# Patient Record
Sex: Male | Born: 1940 | Race: Black or African American | Hispanic: No | Smoking: Current every day smoker
Health system: Southern US, Community
[De-identification: ages and names within clinical notes are randomized; demographics above are authoritative.]

## PROBLEM LIST (undated history)

## (undated) DIAGNOSIS — I2510 Atherosclerotic heart disease of native coronary artery without angina pectoris: Secondary | ICD-10-CM

## (undated) DIAGNOSIS — E785 Hyperlipidemia, unspecified: Secondary | ICD-10-CM

## (undated) DIAGNOSIS — J449 Chronic obstructive pulmonary disease, unspecified: Secondary | ICD-10-CM

## (undated) DIAGNOSIS — Z720 Tobacco use: Secondary | ICD-10-CM

## (undated) DIAGNOSIS — I10 Essential (primary) hypertension: Secondary | ICD-10-CM

## (undated) DIAGNOSIS — R05 Cough: Secondary | ICD-10-CM

## (undated) DIAGNOSIS — J45909 Unspecified asthma, uncomplicated: Secondary | ICD-10-CM

## (undated) DIAGNOSIS — R0600 Dyspnea, unspecified: Secondary | ICD-10-CM

## (undated) DIAGNOSIS — R059 Cough, unspecified: Secondary | ICD-10-CM

## (undated) DIAGNOSIS — I739 Peripheral vascular disease, unspecified: Secondary | ICD-10-CM

## (undated) DIAGNOSIS — I119 Hypertensive heart disease without heart failure: Secondary | ICD-10-CM

## (undated) DIAGNOSIS — I219 Acute myocardial infarction, unspecified: Secondary | ICD-10-CM

## (undated) HISTORY — PX: SHOULDER SURGERY: SHX246

## (undated) HISTORY — DX: Hypertensive heart disease without heart failure: I11.9

## (undated) HISTORY — DX: Hyperlipidemia, unspecified: E78.5

## (undated) HISTORY — PX: CARDIAC CATHETERIZATION: SHX172

## (undated) HISTORY — DX: Acute myocardial infarction, unspecified: I21.9

## (undated) HISTORY — PX: CORONARY ANGIOPLASTY: SHX604

## (undated) HISTORY — DX: Atherosclerotic heart disease of native coronary artery without angina pectoris: I25.10

---

## 2000-05-12 ENCOUNTER — Inpatient Hospital Stay (HOSPITAL_COMMUNITY): Admission: AD | Admit: 2000-05-12 | Discharge: 2000-05-16 | Payer: Self-pay | Admitting: Internal Medicine

## 2000-05-16 ENCOUNTER — Encounter: Payer: Self-pay | Admitting: Cardiology

## 2004-02-17 ENCOUNTER — Ambulatory Visit (HOSPITAL_COMMUNITY): Admission: RE | Admit: 2004-02-17 | Discharge: 2004-02-17 | Payer: Self-pay | Admitting: Orthopedic Surgery

## 2004-02-17 ENCOUNTER — Observation Stay (HOSPITAL_COMMUNITY): Admission: RE | Admit: 2004-02-17 | Discharge: 2004-02-18 | Payer: Self-pay | Admitting: Orthopedic Surgery

## 2004-02-17 ENCOUNTER — Ambulatory Visit (HOSPITAL_BASED_OUTPATIENT_CLINIC_OR_DEPARTMENT_OTHER): Admission: RE | Admit: 2004-02-17 | Discharge: 2004-02-17 | Payer: Self-pay | Admitting: Orthopedic Surgery

## 2004-02-20 ENCOUNTER — Ambulatory Visit: Payer: Self-pay | Admitting: Family Medicine

## 2004-05-19 ENCOUNTER — Ambulatory Visit: Payer: Self-pay | Admitting: Family Medicine

## 2004-05-20 ENCOUNTER — Ambulatory Visit: Payer: Self-pay | Admitting: Family Medicine

## 2004-08-12 ENCOUNTER — Ambulatory Visit: Payer: Self-pay | Admitting: Family Medicine

## 2004-10-07 ENCOUNTER — Ambulatory Visit: Payer: Self-pay

## 2012-12-10 ENCOUNTER — Emergency Department: Payer: Self-pay | Admitting: Emergency Medicine

## 2012-12-11 LAB — CBC WITH DIFFERENTIAL/PLATELET
Basophil #: 0.1 10*3/uL (ref 0.0–0.1)
Basophil %: 0.9 %
Eosinophil #: 0.1 10*3/uL (ref 0.0–0.7)
Eosinophil %: 0.9 %
HCT: 45 % (ref 40.0–52.0)
HGB: 14.6 g/dL (ref 13.0–18.0)
Lymphocyte #: 3 10*3/uL (ref 1.0–3.6)
Lymphocyte %: 33.9 %
MCH: 28.8 pg (ref 26.0–34.0)
MCHC: 32.4 g/dL (ref 32.0–36.0)
MCV: 89 fL (ref 80–100)
Monocyte #: 0.9 x10 3/mm (ref 0.2–1.0)
Monocyte %: 10.4 %
Neutrophil #: 4.7 10*3/uL (ref 1.4–6.5)
Neutrophil %: 53.9 %
Platelet: 186 10*3/uL (ref 150–440)
RBC: 5.07 10*6/uL (ref 4.40–5.90)
RDW: 16 % — ABNORMAL HIGH (ref 11.5–14.5)
WBC: 8.7 10*3/uL (ref 3.8–10.6)

## 2012-12-11 LAB — BASIC METABOLIC PANEL
Anion Gap: 2 — ABNORMAL LOW (ref 7–16)
BUN: 15 mg/dL (ref 7–18)
Calcium, Total: 8.9 mg/dL (ref 8.5–10.1)
Chloride: 104 mmol/L (ref 98–107)
Co2: 32 mmol/L (ref 21–32)
Creatinine: 0.95 mg/dL (ref 0.60–1.30)
EGFR (African American): >60
EGFR (Non-African Amer.): >60
Glucose: 103 mg/dL — ABNORMAL HIGH (ref 65–99)
Osmolality: 277 (ref 275–301)
Potassium: 3.7 mmol/L (ref 3.5–5.1)
Sodium: 138 mmol/L (ref 136–145)

## 2012-12-11 LAB — URINALYSIS, COMPLETE
Bacteria: NONE SEEN
Bilirubin,UR: NEGATIVE
Blood: NEGATIVE
Glucose,UR: NEGATIVE mg/dL (ref 0–75)
Ketone: NEGATIVE
Leukocyte Esterase: NEGATIVE
Nitrite: NEGATIVE
Ph: 7 (ref 4.5–8.0)
Protein: NEGATIVE
RBC,UR: NONE SEEN /HPF (ref 0–5)
Specific Gravity: 1.006 (ref 1.003–1.030)
Squamous Epithelial: <1
WBC UR: 1 /HPF (ref 0–5)

## 2012-12-11 LAB — TROPONIN I
Troponin-I: 0.04 ng/mL
Troponin-I: 0.05 ng/mL

## 2012-12-11 LAB — PRO B NATRIURETIC PEPTIDE: B-Type Natriuretic Peptide: 367 pg/mL — ABNORMAL HIGH (ref 0–125)

## 2012-12-13 ENCOUNTER — Inpatient Hospital Stay: Payer: Self-pay | Admitting: Internal Medicine

## 2012-12-13 LAB — COMPREHENSIVE METABOLIC PANEL
Alkaline Phosphatase: 47 U/L
Anion Gap: 6 — ABNORMAL LOW (ref 7–16)
Bilirubin,Total: 0.2 mg/dL (ref 0.2–1.0)
Calcium, Total: 9.1 mg/dL (ref 8.5–10.1)
Chloride: 103 mmol/L (ref 98–107)
Co2: 28 mmol/L (ref 21–32)
EGFR (African American): >60
Glucose: 80 mg/dL (ref 65–99)
Osmolality: 273 (ref 275–301)
Potassium: 4 mmol/L (ref 3.5–5.1)
SGOT(AST): 19 U/L (ref 15–37)
SGPT (ALT): 19 U/L (ref 12–78)
Sodium: 137 mmol/L (ref 136–145)
Total Protein: 7.7 g/dL (ref 6.4–8.2)

## 2012-12-13 LAB — CBC
HGB: 14.8 g/dL (ref 13.0–18.0)
MCHC: 32.3 g/dL (ref 32.0–36.0)
MCV: 88 fL (ref 80–100)
Platelet: 175 10*3/uL (ref 150–440)
RBC: 5.2 10*6/uL (ref 4.40–5.90)
WBC: 7.6 10*3/uL (ref 3.8–10.6)

## 2012-12-13 LAB — CK TOTAL AND CKMB (NOT AT ARMC)
CK, Total: 76 U/L (ref 35–232)
CK-MB: 1.9 ng/mL (ref 0.5–3.6)

## 2012-12-13 LAB — APTT: Activated PTT: 33.7 secs (ref 23.6–35.9)

## 2012-12-13 LAB — PROTIME-INR
INR: 1
Prothrombin Time: 13.6 secs (ref 11.5–14.7)

## 2012-12-13 LAB — TROPONIN I
Troponin-I: 0.02 ng/mL
Troponin-I: 0.03 ng/mL

## 2012-12-14 LAB — CBC WITH DIFFERENTIAL/PLATELET
Basophil #: 0.1 10*3/uL (ref 0.0–0.1)
Basophil %: 1.2 %
Eosinophil #: 0.1 10*3/uL (ref 0.0–0.7)
HGB: 14.2 g/dL (ref 13.0–18.0)
Lymphocyte #: 2.5 10*3/uL (ref 1.0–3.6)
MCH: 28.8 pg (ref 26.0–34.0)
Monocyte #: 0.9 x10 3/mm (ref 0.2–1.0)
Monocyte %: 10.2 %
Platelet: 178 10*3/uL (ref 150–440)
RBC: 4.94 10*6/uL (ref 4.40–5.90)
RDW: 15.4 % — ABNORMAL HIGH (ref 11.5–14.5)
WBC: 8.6 10*3/uL (ref 3.8–10.6)

## 2012-12-14 LAB — BASIC METABOLIC PANEL
Anion Gap: 9 (ref 7–16)
Chloride: 101 mmol/L (ref 98–107)
Creatinine: 0.92 mg/dL (ref 0.60–1.30)
EGFR (African American): >60
EGFR (Non-African Amer.): >60
Glucose: 129 mg/dL — ABNORMAL HIGH (ref 65–99)
Osmolality: 278 (ref 275–301)
Potassium: 3.6 mmol/L (ref 3.5–5.1)

## 2012-12-14 LAB — TROPONIN I
Troponin-I: 0.03 ng/mL
Troponin-I: 0.03 ng/mL

## 2012-12-14 LAB — LIPID PANEL
Ldl Cholesterol, Calc: 86 mg/dL (ref 0–100)
Triglycerides: 140 mg/dL (ref 0–200)

## 2012-12-15 ENCOUNTER — Emergency Department: Payer: Self-pay | Admitting: Emergency Medicine

## 2014-05-02 NOTE — Consult Note (Signed)
PATIENT NAME:  Jacob Ford, Jacob Ford DATE OF BIRTH:  Jul 19, 1940  DATE OF CONSULTATION:  12/14/2012  CONSULTING PHYSICIAN:  Laurier NancyShaukat A. Gilda Abboud, MD  INDICATION FOR CONSULTATION: Uncontrolled hypertension.  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 The patient is feeling much better now. The patient is normally followed by Dr. Maryellen PileEason. His blood pressure in the Emergency Room was 215/134. It came down to 170 to 180 systolic gradually.   PAST MEDICAL HISTORY: Just history of hypertension.   ALLERGIES: None.   SOCIAL HISTORY: He smokes 1 pack per day. Is a retired Corporate investment bankerconstruction worker. He used to be heavy alcohol user but not right now. No history of IV drug abuse.   FAMILY HISTORY: Positive for hypertension.   MEDICATIONS: He normally takes metoprolol 25 mg b.i.d.   PHYSICAL EXAMINATION: GENERAL: He is alert, oriented x 3, in no acute distress.  VITAL SIGNS: Right now his blood pressure is 148/80, respirations 18, pulse 74, temperature 97.9.  NECK: No JVD.  LUNGS: Clear.  HEART: Regular rate and rhythm. Normal S1, S2. No audible murmur.  ABDOMEN: Soft, nontender, positive bowel sounds.  EXTREMITIES: No pedal edema.  NEUROLOGIC: The patient appears to be intact.   EKG shows normal sinus rhythm, LVH, nonspecific ST-T changes.   ASSESSMENT AND PLAN: Uncontrolled hypertension. The patient is normally only on metoprolol. I added hydrochlorothiazide 25 mg once a day. Blood pressure appears to be much better. May be able to go home. He is feeling much better. We will follow up in the office at 3:00 on Tuesday.   Thank you very much for the referral.  ____________________________ Laurier NancyShaukat A. Quetzaly Ebner, MD sak:jcm D: 12/14/2012 13:37:31 ET T: 12/14/2012 14:07:39 ET JOB#: [id number]  cc: Laurier NancyShaukat A. Maleea Camilo, MD, <Dictator>  Laurier NancySHAUKAT A Alyx Mcguirk  MD ELECTRONICALLY SIGNED 12/17/2012 8:33

## 2014-05-02 NOTE — Discharge Summary (Signed)
PATIENT NAME:  Jacob Ford, Jacob Ford MR#:  [id number] DATE OF BIRTH:  05-Dec-1940  DATE OF ADMISSION:  12/13/2012 DATE OF DISCHARGE:  12/14/2012  FINAL DIAGNOSES:  1. Malignant hypertension.  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  CONDITION ON DISCHARGE: Stable.   CODE STATUS: Full code.   MEDICATIONS ON DISCHARGE:  1. Metoprolol 25 mg b.i.d.  2. Hydrochlorothiazide 25 mg once a day.   DIET ON DISCHARGE: Low sodium. Diet consistency: Regular.  FOLLOWUP: Advised to have followup in 3 to 4 days in Dr. Christy SartoriusShaukat Khan's office.  HISTORY OF PRESENT ILLNESS: As per Dr. Margaretmary EddyShah's report on 4th of December. A 74 year old male with unknown medical problem, admitted for uncontrolled hypertension. The patient was in the Emergency Room for uncontrolled blood pressure 2 days ago, and metoprolol was given and discharged home, but he started feeling lightheaded and decided to come back to the Emergency Room. His blood pressure was noticed at 215/134 in the ER and so admitted for further management of hypertensive urgency.   HOSPITAL COURSE AND STAY: For his malignant hypertension, he was started on nicardipine and nitroglycerin IV drip, and blood pressure came under control within a few hours. After that, he was switched to oral and stopped IV drip. Blood pressure remained stable on metoprolol and hydrochlorothiazide. As he was hypertensive for unknown period of time, there might be a component of hypertensive cardiomyopathy. There were some EKG changes suggestive of that, so cardiology consult was called in. Dr. Adrian BlackwaterShaukat Khan saw him, and he suggested to discharge the patient home, as blood pressure came under control, and he will do echocardiogram in the office, and so we discharged him home.   Other medical issues: Smoking. He was a smoker. Smoking cessation counseling was done.  IMPORTANT LABORATORY RESULTS IN THE HOSPITAL: Troponin 0.02. INR was 1. WBC 7.6, hemoglobin 14.8, platelet count 175. Creatinine 0.77,  potassium 4.0. Creatinine and hemoglobin were stable the next day.  TOTAL TIME SPENT ON THIS DISCHARGE: 40 minutes.   ____________________________ Hope PigeonVaibhavkumar G. Elisabeth PigeonVachhani, MD vgv:lb D: 12/18/2012 09:11:31 ET T: 12/18/2012 09:38:56 ET JOB#: [id number]  cc: Hope PigeonVaibhavkumar G. Elisabeth PigeonVachhani, MD, <Dictator> Laurier NancyShaukat A. Khan, MD Altamese DillingVAIBHAVKUMAR Nassim Cosma MD ELECTRONICALLY SIGNED 12/31/2012 14:20

## 2014-05-02 NOTE — H&P (Signed)
PATIENT NAME:  Jacob Ford, Jacob Ford MR#:  [id number] DATE OF BIRTH:  08-May-1940  DATE OF ADMISSION:  12/13/2012  CONSULTING PHYSICIAN:  Dr. Sherryll Burger, hospitalist.  PRIMARY CARE PHYSICIAN:  None.  REQUESTING PHYSICIAN:  Dr. Sharman Cheek.  CHIEF COMPLAINT:  Lightheaded and dizziness. [name]   HISTORY OF PRESENT ILLNESS:  The patient is a 74 year old male with a known medical problem who is being admitted for uncontrolled hypertension. The patient was seen here in the Emergency Department on last Monday for uncontrolled blood pressure and above-mentioned symptoms and was prescribed metoprolol and was discharged home with outpatient followup with Dr. Maryellen Pile. The patient continued to get more lightheaded and decided to come back to the Emergency Department as his blood pressure at home on monitor was running anywhere from 170 to 180s. While in the ED, his blood pressure was 215/134 and he is being admitted for further evaluation and management.  PAST MEDICAL HISTORY:  Hypertension.   ALLERGIES:  No known drug allergies.   SOCIAL HISTORY:  Smokes 1 pack of cigarettes daily for 60 years. 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 He used to be a heavy alcohol drinker, not much since the last four years. Denies any IV drugs of abuse.   FAMILY HISTORY:  Father and mother both had hypertension. Father died of brain aneurysm in mid 47s. Mother died of stroke.   MEDICATIONS AT HOME:  Usually he does not take any but recently from his ED visit, he was prescribed. Metoprolol 25 mg 1 tablet twice a day, which he has started taking.   REVIEW OF SYSTEMS:  CONSTITUTIONAL:  No fever. Positive fatigue and weakness.  EYES:  No blurred or double vision. He wears glasses.  ENT:  No tinnitus or ear pain.  RESPIRATORY:  No cough, wheezing, hemoptysis.  CARDIOVASCULAR:  No chest pain, orthopnea, edema.  GASTROINTESTINAL:  No nausea, vomiting or diarrhea.  GENITOURINARY:  No dysuria, hematuria.   ENDOCRINE:  No polyuria or nocturia.  HEMATOLOGY:  No anemia or easy bruising.  SKIN:  No rash or lesion.  MUSCULOSKELETAL:  No arthritis or muscle cramp.  NEUROLOGIC:  No tingling or numbness. Positive for weakness and dizziness.  PSYCHIATRIC:  No history of anxiety or depression.   PHYSICAL EXAMINATION:  VITAL SIGNS:  Temperature 97.8, heart rate 79 per minute, respirations 20 per minute, blood pressure 215/134 mmHg, saturating 96% room air.  GENERAL:  The patient is a 74 year old African American male lying in the bed comfortably without any acute distress.  EYES:  Pupils equal, round and reactive to accommodation. No scleral icterus. Extraocular muscles intact.  HEENT:  Head atraumatic, normocephalic. Oropharynx and nasopharynx clear. NECK:  Supple. No jugular venous distention. No thyroid enlargement or tenderness.  LUNGS:  Clear to auscultation bilaterally. No wheezing, rales, rhonchi or crepitation.  CARDIOVASCULAR:  S1, S2 normal. No murmurs, rubs, or gallop.  ABDOMEN:  Soft, nontender, nondistended. Bowel sounds present. No organomegaly.  EXTREMITIES:  No pedal edema, cyanosis, or clubbing.  NEUROLOGIC:  Nonfocal examination. Cranial nerves II through XII intact. Muscle strength 5/5 in all extremities. Sensation intact.  PSYCHIATRIC:  Alert and oriented x 3.  SKIN:  No obvious rash, lesion or ulcer.   LABORATORY, DIAGNOSTIC, AND RADIOLOGICAL DATA:   Negative urinalysis on 2nd of December. Normal coagulation panel. Normal CBC. Normal first set of cardiac enzymes. Normal liver function tests. Normal BMP.   Chest x-ray in the Emergency Department today shows no acute cardiopulmonary disease.   EKG  shows normal sinus rhythm, possible left atrial enlargement. No major ST-T changes.   IMPRESSION AND PLAN:  1.  Malignant hypertension with blood pressure of 215/134 and has been continued to stay high. He was given 20 mg of IV labetalol in the Emergency Department but pressure has still  been staying in 210/130 and he is being admitted to Critical Care Unit Stepdown for close monitoring. We will start him on IV nicardipine drip. We will also start hydrochlorothiazide and metoprolol and consult cardiology for medication help. We will get 2-D echo for any evaluating any organ damage. He had a urinalysis recently, so we will hold off repeating urinalysis. He does have a strong history of brain aneurysm and this may need to be looked into it. Certainly, we need to get his blood pressure under good control as both of his parents died due to likely hypertensive complication with stroke and aneurysm rupture. He also has symptoms of lightheaded and dizziness which is likely due to malignant hypertension.  2.  Tobacco abuse. He has a long-standing smoking history. He was counseled for about three minutes. He is trying to cut back, but not there yet. In any need of nicotine replacement therapy while in the hospital.   CODE STATUS:  FULL CODE.   TOTAL TIME TAKING CARE OF THIS PATIENT (CRITICAL CARE):  55 minutes. He remains at very high risk for hypertensive complications. He will need close monitoring in the Critical Care Unit on a Cardizem drip.    ____________________________ Ellamae SiaVipul S. Sherryll BurgerShah, MD vss:jm D: 12/13/2012 16:44:59 ET T: 12/13/2012 17:28:34 ET JOB#: [id number]  cc: Elizar Alpern S. Sherryll BurgerShah, MD, <Dictator> Serita ShellerErnest B. Maryellen PileEason, MD Patricia PesaVIPUL S Corlis Angelica MD ELECTRONICALLY SIGNED 12/14/2012 18:14

## 2014-05-02 NOTE — Consult Note (Signed)
General Aspect Jacob Ford is a 74yo African American male w/ minimal known PMHx including HTN and ongoing tobacco abuse who was admitted to [facility] yesterday for malignant hypertension.   He was recently evaluated at [facility] ED for elevated BP. Labwork including CBC, BMP, TnI, u/a returned WNL. Noncontrast head CT showed no acute intracranial abnormalities. CXR showed mild cardiac enlargement, otherwise no acute cardiopulmonary process. He was discharged on metoprolol tartrate 68m PO BID. He returned yesterday c/o lightheadedness and headache. BP at home noted to be in the 170-180 (SBP) range. He does continue to smoke. Given his ongoing symptoms[facility]  He denies any prior cardiac history. He denies chest pain, worsening SOB (dyspneic at baseline from COPD), LE edema, PND or orthopnea. No hematuria. He continues to smoke 1 PPD. He is scheduled to establish primary care with Dr. EFarrel Conners 1.   Present Illness There, BP was found to be returned very mildly elevated at 215/134. EKG showed LAE, boderline LAE, early repolarization and inferolateral ST/T changes c/w LV strain. BNP was very mildly elevated at 367. CBC and TnI returned WNL. CMP showed a mild hypoalbuminemia at 3.3, but was otherwise WNL. Portable CXR showed no active disease. He was given a dose of IV labetalol which had minimal effect. He was admitted by the medicine service and started on a nicardipine drip. BP today has been mostly in the 150/80-90 range. Two subsequent troponins returned WNL.   PAST MEDICAL HISTORY:  Hypertension.   ALLERGIES:  No known drug allergies.   SOCIAL HISTORY:  Smokes 1 pack of cigarettes daily for 60 years. 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 He used to be a heavy alcohol drinker, not much since the last four years. Denies any IV drugs of abuse.   FAMILY HISTORY:  Father and mother both had hypertension. Father died of brain aneurysm in mid 451s Mother died of  stroke.   Physical Exam:  GEN no acute distress, thin   HEENT pink conjunctivae, PERRL, hearing intact to voice   NECK supple  No masses  trachea midline  no JVD or bruits   RESP normal resp effort  no use of accessory muscles  scattered rhonchi which clears with cough, no wheezes or rales   CARD Regular rate and rhythm  Normal, S1, S2  No murmur   ABD denies tenderness  soft  normal BS   EXTR negative cyanosis/clubbing, negative edema   SKIN normal to palpation, skin turgor good   NEURO follows commands, motor/sensory function intact   PSYCH alert, A+O to time, place, person   Review of Systems:  Subjective/Chief Complaint headache, lightheadedness   General: no fevers, chills, n/v/d, abnormal bleeding, + nonproductive cough   Neurologic: no isolated weakness, numbness, facial droop or slurred speech   Review of Systems: All other systems were reviewed and found to be negative   Home Medications: Medication Instructions Status  Metoprolol Tartrate 25 mg oral tablet 1 tab(s) orally 2 times a day Active   Lab Results:  Routine Chem:  05-Dec-14 03:46   Cholesterol, Serum 161  Triglycerides, Serum 140  HDL (INHOUSE) 47  VLDL Cholesterol Calculated 28  LDL Cholesterol Calculated 86 (Result(s) reported on 14 Dec 2012 at 09:49AM.)  Glucose, Serum  129  BUN 14  Creatinine (comp) 0.92  Sodium, Serum 138  Potassium, Serum 3.6  Chloride, Serum 101  CO2, Serum 28  Calcium (Total), Serum 9.0  Anion Gap 9  Osmolality (calc) 278  eGFR (African American) >60  eGFR (Non-African American) >60 (eGFR values <55m/min/1.73 m2 may be an indication of chronic kidney disease (CKD). Calculated eGFR is useful in patients with stable renal function. The eGFR calculation will not be reliable in acutely ill patients when serum creatinine is changing rapidly. It is not useful in  patients on dialysis. The eGFR calculation may not be applicable to patients at the low and high  extremes of body sizes, pregnant women, and vegetarians.)  Cardiac:  04-Dec-14 15:02   Troponin I 0.02 (0.00-0.05 0.05 ng/mL or less: NEGATIVE  Repeat testing in 3-6 hrs  if clinically indicated. >0.05 ng/mL: POTENTIAL  MYOCARDIAL INJURY. Repeat  testing in 3-6 hrs if  clinically indicated. NOTE: An increase or decrease  of 30% or more on serial  testing suggests a  clinically important change)  CK, Total 76  CPK-MB, Serum 1.9 (Result(s) reported on 13 Dec 2012 at 03:38PM.)    19:13   Troponin I 0.03 (0.00-0.05 0.05 ng/mL or less: NEGATIVE  Repeat testing in 3-6 hrs  if clinically indicated. >0.05 ng/mL: POTENTIAL  MYOCARDIAL INJURY. Repeat  testing in 3-6 hrs if  clinically indicated. NOTE: An increase or decrease  of 30% or more on serial  testing suggests a  clinically important change)    23:40   Troponin I 0.03 (0.00-0.05 0.05 ng/mL or less: NEGATIVE  Repeat testing in 3-6 hrs  if clinically indicated. >0.05 ng/mL: POTENTIAL  MYOCARDIAL INJURY. Repeat  testing in 3-6 hrs if  clinically indicated. NOTE: An increase or decrease  of 30% or more on serial  testing suggests a  clinically important change)  05-Dec-14 03:46   Troponin I 0.03 (0.00-0.05 0.05 ng/mL or less: NEGATIVE  Repeat testing in 3-6 hrs  if clinically indicated. >0.05 ng/mL: POTENTIAL  MYOCARDIAL INJURY. Repeat  testing in 3-6 hrs if  clinically indicated. NOTE: An increase or decrease  of 30% or more on serial  testing suggests a  clinically important change)  Routine Hem:  05-Dec-14 03:46   WBC (CBC) 8.6  RBC (CBC) 4.94  Hemoglobin (CBC) 14.2  Hematocrit (CBC) 43.5  Platelet Count (CBC) 178  MCV 88  MCH 28.8  MCHC 32.7  RDW  15.4  Neutrophil % 57.7  Lymphocyte % 29.7  Monocyte % 10.2  Eosinophil % 1.2  Basophil % 1.2  Neutrophil # 4.9  Lymphocyte # 2.5  Monocyte # 0.9  Eosinophil # 0.1  Basophil # 0.1 (Result(s) reported on 14 Dec 2012 at 04:44AM.)   EKG:   Interpretation NSR, LAE, borderline LVH, downsloping ST depressions V5, V6 with asymmetric TWIs in V5, V6, II, III, aVF, early repolarization appreciated in V1-V3   Rate 80   EKG Comparision Not changed from  12/4 tracing at 1424   Radiology Results: XRay:    04-Dec-14 14:45, Chest Portable Single View  Chest Portable Single View   REASON FOR EXAM:    Chest Pain  COMMENTS:       PROCEDURE: DXR - DXR PORTABLE CHEST SINGLE VIEW  - Dec 13 2012  2:45PM     CLINICAL DATA:  Chest pain    EXAM:  PORTABLE CHEST - 1 VIEW    COMPARISON:  None.    FINDINGS:  The heart size and mediastinalcontours are within normal limits.  Both lungs are clear. The visualized skeletal structures are  unremarkable.     IMPRESSION:  No active disease.      Electronically Signed  By: Kerby Moors M.D.    On: 12/13/2012 14:56         Verified By: Angelita Ingles, M.D.,    No Known Allergies:   Vital Signs/Nurse's Notes: **Vital Signs.:   05-Dec-14 09:00  Vital Signs Type Routine  Pulse Pulse 86  Respirations Respirations 18  Systolic BP Systolic BP 518  Diastolic BP (mmHg) Diastolic BP (mmHg) 90  Mean BP 111  Pulse Ox % Pulse Ox % 95  Pulse Ox Activity Level  At rest  Oxygen Delivery Room Air/ 21 %    Impression 74yo African American male w/ minimal known PMHx including HTN and ongoing tobacco abuse who was admitted to [facility] yesterday for malignant hypertension.  1. Malignant hypertension BP trended down with IV nicardipine. Off infusion currenlty. SBP trending back up to 170s. Did receive a dose of HCTZ this AM. Suspect a component of familial HTN and underlying tobacco abuse contributing. He has stopped smoking since Monday, and withdrawal could account for the large BP spike. Metoprolol tartrate not the best choice for this demographic. CCBs, diuretics and vasodilators more effective in the African American population, hence the good response to IV nicardipine. EKG does show evidence  of hypertensive heart disease- LAE, borderline LVH, LV strain and early repolarization. CXR shows cardiomegaly. No CHF type symptoms. No hematuria, neurological deficits, chest pain. Goal per new guideliens for patients > 60yo without CKD or DM2 is < 150/90. -- Will replace metoprolol with amlodipine 70m daily -- Continue HCTZ -- Await echo to be performed -- Avoid salt -- Follow-up in 1-2 weeks in our clinic with BMP  2. 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 May be at stage B on the heart failure spectrum. Will need to control BP.  -- Start low dose ASA -- Interpret 2D echo -- Could consider low-dose ACEi/BB if EF reduced. Ischemic eval will also need to be considered if this is the case.   3. Ongoing tobacco abuse with probable COPD Withdrawal contributing to elevated BP. Rhoncherous on exam.  -- Cessation stressed  -- Offer NRT in the form of nicotine patch taper or nicotine gum -- Would start Spiriva + albuterol PRN, and then PCP follow-up   Electronic Signatures: Lillyian Heidt A (PA-C)   (Signed 05-Dec-14 11:32)  Authored: History and Physical Exam, Review of System, Home Medications, Labs, Radiology, Allergies, Vital Signs/Nurse's Notes, General Aspect/Present Illness, Impression/Plan, EKG AKathlyn Sacramento(MD)   (Signed 05-Dec-14 16:37)  Co-Signer: History and Physical Exam, Review of System, Home Medications, Labs, Radiology, Allergies, Vital Signs/Nurse's Notes, General Aspect/Present Illness, Impression/Plan, EKG  Last Updated: 05-Dec-14 16:37 by AKathlyn Sacramento(MD)

## 2014-12-05 ENCOUNTER — Emergency Department: Payer: Medicare Other

## 2014-12-05 ENCOUNTER — Emergency Department
Admission: EM | Admit: 2014-12-05 | Discharge: 2014-12-05 | Disposition: A | Discharge status: Home / Self Care | Payer: Medicare Other | Source: Home / Self Care | Attending: Emergency Medicine | Admitting: Emergency Medicine

## 2014-12-05 ENCOUNTER — Encounter: Payer: Self-pay | Admitting: Emergency Medicine

## 2014-12-05 DIAGNOSIS — R072 Precordial pain: Secondary | ICD-10-CM | POA: Diagnosis not present

## 2014-12-05 DIAGNOSIS — I214 Non-ST elevation (NSTEMI) myocardial infarction: Secondary | ICD-10-CM | POA: Diagnosis not present

## 2014-12-05 DIAGNOSIS — R079 Chest pain, unspecified: Secondary | ICD-10-CM

## 2014-12-05 HISTORY — DX: Essential (primary) hypertension: I10

## 2014-12-05 HISTORY — DX: Chronic obstructive pulmonary disease, unspecified: J44.9

## 2014-12-05 LAB — BASIC METABOLIC PANEL
Anion gap: 6 (ref 5–15)
BUN: 11 mg/dL (ref 6–20)
CHLORIDE: 102 mmol/L (ref 101–111)
CO2: 31 mmol/L (ref 22–32)
Calcium: 9 mg/dL (ref 8.9–10.3)
Creatinine, Ser: 0.98 mg/dL (ref 0.61–1.24)
GFR calc Af Amer: >60 mL/min (ref >60)
GFR calc non Af Amer: >60 mL/min (ref >60)
Glucose, Bld: 85 mg/dL (ref 65–99)
Potassium: 3.5 mmol/L (ref 3.5–5.1)
Sodium: 139 mmol/L (ref 135–145)

## 2014-12-05 LAB — TROPONIN I
TROPONIN I: 0.04 ng/mL — AB (ref <0.031)
TROPONIN I: 0.05 ng/mL — AB (ref <0.031)

## 2014-12-05 LAB — CBC
HCT: 47.5 % (ref 40.0–52.0)
HEMOGLOBIN: 15.2 g/dL (ref 13.0–18.0)
MCH: 27.8 pg (ref 26.0–34.0)
MCHC: 31.9 g/dL — ABNORMAL LOW (ref 32.0–36.0)
MCV: 87.2 fL (ref 80.0–100.0)
Platelets: 198 10*3/uL (ref 150–440)
RBC: 5.45 MIL/uL (ref 4.40–5.90)
RDW: 15.8 % — ABNORMAL HIGH (ref 11.5–14.5)
WBC: 7.3 10*3/uL (ref 3.8–10.6)

## 2014-12-05 MED ORDER — ASPIRIN 81 MG PO CHEW
324.0000 mg | CHEWABLE_TABLET | Freq: Once | ORAL | Status: AC
  Administered 2014-12-05: 324 mg via ORAL
  Filled 2014-12-05: qty 4

## 2014-12-05 MED ORDER — LABETALOL HCL 5 MG/ML IV SOLN
20.0000 mg | Freq: Once | INTRAVENOUS | Status: DC
  Filled 2014-12-05: qty 4

## 2014-12-05 MED ORDER — IOHEXOL 350 MG/ML SOLN
100.0000 mL | Freq: Once | INTRAVENOUS | Status: AC | PRN
  Administered 2014-12-05: 100 mL via INTRAVENOUS

## 2014-12-05 NOTE — Discharge Instructions (Signed)
Cardiac-Specific Troponin I and T Test WHY AM I HAVING THIS TEST? You may have this test if you have experienced chest pain. The test can be used to determine if you have had a heart attack or injury to heart (cardiac) muscle. This test can also help predict the possibility of future heart attacks. This test measures the concentration of cardiac-specific troponin in your blood. Troponins are proteins that help muscles contract. There are three forms of troponin, including troponins C, I, and T. The types of troponins I and T that are found in cardiac muscle are different from the troponins I and T that are found in skeletal muscle. Therefore, testing can be done for cardiac-specific troponins I and T. These types of troponin are normally present in very small quantities in the blood. When there is damage to heart muscle cells, cardiac troponins I and T are released into circulation. The more damage there is, the greater the concentration of troponins I and T. When a person has a heart attack, levels of troponin can become elevated in the blood within 3-4 hours after injury and may remain elevated for 10-14 days. WHAT KIND OF SAMPLE IS TAKEN? A blood sample is required for this test. It is usually collected by inserting a needle into a vein. Usually, an initial blood sample is collected, and then another blood sample is collected 12 hours later. After these samples, you will have your blood tested daily for 3-5 days. You might also have it tested weekly for 5-6 weeks. HOW DO I PREPARE FOR THE TEST? There is no preparation required for this test. However, be aware that you will need to make arrangements to have your blood collected frequently.  WHAT ARE THE REFERENCE RANGES? Reference values are considered healthy values established after testing a large group of healthy people. Reference values may vary among different people, labs, and hospitals. It is your responsibility to obtain your test results. Ask  the lab or department performing the test when and how you will get your results. Reference values for cardiac troponins are as follows:  Cardiac troponin T: less than 0.1 ng/mL.  Cardiac troponin I: less than 0.03 ng/mL. WHAT DO THE RESULTS MEAN? Troponin values above the reference values may indicate:  Injury to the heart muscle.  Heart attack. Talk with your health care provider to discuss your results, treatment options, and if necessary, the need for more tests. Talk with your health care provider if you have any questions about your results.   This information is not intended to replace advice given to you by your health care provider. Make sure you discuss any questions you have with your health care provider.   Document Released: 01/30/2004 Document Revised: 01/17/2014 Document Reviewed: 05/22/2013 Elsevier Interactive Patient Education 2016 [employer]  Nonspecific Chest Pain  Chest pain can be caused by many different conditions. There is always a chance that your pain could be related to something serious, such as a heart attack or a blood clot in your lungs. Chest pain can also be caused by conditions that are not life-threatening. If you have chest pain, it is very important to follow up with your health care provider. CAUSES  Chest pain can be caused by:  Heartburn.  Pneumonia or bronchitis.  Anxiety or stress.  Inflammation around your heart (pericarditis) or lung (pleuritis or pleurisy).  A blood clot in your lung.  A collapsed lung (pneumothorax). It can develop suddenly on its own (spontaneous pneumothorax) or from trauma  to the chest.  Shingles infection (varicella-zoster virus).  Heart attack.  Damage to the bones, muscles, and cartilage that make up your chest wall. This can include:  Bruised bones due to injury.  Strained muscles or cartilage due to frequent or repeated coughing or overwork.  Fracture to one or more ribs.  Sore cartilage due to  inflammation (costochondritis). RISK FACTORS  Risk factors for chest pain may include:  Activities that increase your risk for trauma or injury to your chest.  Respiratory infections or conditions that cause frequent coughing.  Medical conditions or overeating that can cause heartburn.  Heart disease or family history of heart disease.  Conditions or health behaviors that increase your risk of developing a blood clot.  Having had chicken pox (varicella zoster). SIGNS AND SYMPTOMS Chest pain can feel like:  Burning or tingling on the surface of your chest or deep in your chest.  Crushing, pressure, aching, or squeezing pain.  Dull or sharp pain that is worse when you move, cough, or take a deep breath.  Pain that is also felt in your back, neck, shoulder, or arm, or pain that spreads to any of these areas. Your chest pain may come and go, or it may stay constant. DIAGNOSIS Lab tests or other studies may be needed to find the cause of your pain. Your health care provider may have you take a test called an ambulatory ECG (electrocardiogram). An ECG records your heartbeat patterns at the time the test is performed. You may also have other tests, such as:  Transthoracic echocardiogram (TTE). During echocardiography, sound waves are used to create a picture of all of the heart structures and to look at how blood flows through your heart.  Transesophageal echocardiogram (TEE).This is a more advanced imaging test that obtains images from inside your body. It allows your health care provider to see your heart in finer detail.  Cardiac monitoring. This allows your health care provider to monitor your heart rate and rhythm in real time.  Holter monitor. This is a portable device that records your heartbeat and can help to diagnose abnormal heartbeats. It allows your health care provider to track your heart activity for several days, if needed.  Stress tests. These can be done through  exercise or by taking medicine that makes your heart beat more quickly.  Blood tests.  Imaging tests. TREATMENT  Your treatment depends on what is causing your chest pain. Treatment may include:  Medicines. These may include:  Acid blockers for heartburn.  Anti-inflammatory medicine.  Pain medicine for inflammatory conditions.  Antibiotic medicine, if an infection is present.  Medicines to dissolve blood clots.  Medicines to treat coronary artery disease.  Supportive care for conditions that do not require medicines. This may include:  Resting.  Applying heat or cold packs to injured areas.  Limiting activities until pain decreases. HOME CARE INSTRUCTIONS  If you were prescribed an antibiotic medicine, finish it all even if you start to feel better.  Avoid any activities that bring on chest pain.  Do not use any tobacco products, including cigarettes, chewing tobacco, or electronic cigarettes. If you need help quitting, ask your health care provider.  Do not drink alcohol.  Take medicines only as directed by your health care provider.  Keep all follow-up visits as directed by your health care provider. This is important. This includes any further testing if your chest pain does not go away.  If heartburn is the cause for your chest pain,  you may be told to keep your head raised (elevated) while sleeping. This reduces the chance that acid will go from your stomach into your esophagus.  Make lifestyle changes as directed by your health care provider. These may include:  Getting regular exercise. Ask your health care provider to suggest some activities that are safe for you.  Eating a heart-healthy diet. A registered dietitian can help you to learn healthy eating options.  Maintaining a healthy weight.  Managing diabetes, if necessary.  Reducing stress. SEEK MEDICAL CARE IF:  Your chest pain does not go away after treatment.  You have a rash with blisters on  your chest.  You have a fever. SEEK IMMEDIATE MEDICAL CARE IF:   Your chest pain is worse.  You have an increasing cough, or you cough up blood.  You have severe abdominal pain.  You have severe weakness.  You faint.  You have chills.  You have sudden, unexplained chest discomfort.  You have sudden, unexplained discomfort in your arms, back, neck, or jaw.  You have shortness of breath at any time.  You suddenly start to sweat, or your skin gets clammy.  You feel nauseous or you vomit.  You suddenly feel light-headed or dizzy.  Your heart begins to beat quickly, or it feels like it is skipping beats. These symptoms may represent a serious problem that is an emergency. Do not wait to see if the symptoms will go away. Get medical help right away. Call your local emergency services (911 in the [place] Do not drive yourself to the hospital.   This information is not intended to replace advice given to you by your health care provider. Make sure you discuss any questions you have with your health care provider.   Document Released: 10/06/2004 Document Revised: 01/17/2014 Document Reviewed: 08/02/2013 Elsevier Interactive Patient Education [employer]  Please return immediately if condition worsens. Please contact her primary physician or the physician you were given for referral. If you have any specialist physicians involved in her treatment and plan please also contact them. Thank you for using [facility].  Please take an aspirin a day and notify your primary physician and will follow-up with the recommended cardiologist. Return to emergency department if he was to continue your evaluation for chest pain.

## 2014-12-05 NOTE — ED Notes (Signed)
MD aware of HTN at discharge

## 2014-12-05 NOTE — ED Notes (Signed)
EKG taken at 1400, viewed by Dr Deberah CastlePaduchoski.

## 2014-12-05 NOTE — ED Notes (Signed)
MD Quigley at bedside. 

## 2014-12-05 NOTE — ED Notes (Signed)
RN entered room to administer labetalol, pt disclosed to RN that he just took his own 50 mg metoprolol at this time. MD Huel CoteQuigley made aware, verbalized to hold labetalol at this time in case still needed later. Pt made aware and verbalized understanding, pt also made aware to ask RN first prior to taking any home meds while here. Pt verbalized understanding, no further needs at this time.

## 2014-12-05 NOTE — ED Notes (Signed)
Pt reports right sided chest pain that started last night, reports taking tylenol and getting relief. Pt reports shortness of breath (hx of COPD), nausea.

## 2014-12-05 NOTE — ED Notes (Signed)
Pt given sandwich tray 

## 2014-12-05 NOTE — ED Provider Notes (Signed)
Time Seen: Approximately *----------------------------------------- 4:33 PM on 12/05/2014 -----------------------------------------    I have reviewed the triage notes  Chief Complaint: Chest Pain   History of Present Illness: Jacob Ford is a 74 y.o. male *who presents with chest pain that started last night. Patient states he took 2 Tylenol and had relief of his discomfort. He points mainly to the right upper chest region. He denies any other discomfort to the left side of his chest arm or jaw region. He denies any nausea or vomiting he states he has COPD and has a chronic cough and hasn't noticed any increased cough or fever at home. He denies any productive nature to his cough. He denies any peripheral edema, calf tenderness or swelling. Patient denies any focal weakness in either upper or lower extremities. He states he was pain-free for through most of the night and then ate breakfast this morning or this afternoon at 1 PM and started having this right-sided chest discomfort once again he did not take any medication prior to arrival.   Past Medical History  Diagnosis Date  . COPD (chronic obstructive pulmonary disease) (HCC)   . Hypertension     There are no active problems to display for this patient.   Past Surgical History  Procedure Laterality Date  . Shoulder surgery Left     Past Surgical History  Procedure Laterality Date  . Shoulder surgery Left     No current outpatient prescriptions on file.  Allergies:  Review of patient's allergies indicates no known allergies.  Family History: No family history on file.  Social History: Social History  Substance Use Topics  . Smoking status: Current Every Day Smoker    Types: Cigarettes  . Smokeless tobacco: None  . Alcohol Use: No     Review of Systems:   10 point review of systems was performed and was otherwise negative:  Constitutional: No fever Eyes: No visual disturbances ENT: No sore throat,  ear pain Cardiac: No chest pain Respiratory: No shortness of breath, wheezing, or stridor Abdomen: No abdominal pain, no vomiting, No diarrhea Endocrine: No weight loss, No night sweats Extremities: No peripheral edema, cyanosis Skin: No rashes, easy bruising Neurologic: No focal weakness, trouble with speech or swollowing Urologic: No dysuria, Hematuria, or urinary frequency   Physical Exam:  ED Triage Vitals  Enc Vitals Group     BP 12/05/14 1402 178/100 mmHg     Pulse Rate 12/05/14 1402 77     Resp 12/05/14 1402 22     Temp 12/05/14 1402 97.9 F (36.6 C)     Temp Source 12/05/14 1402 Oral     SpO2 12/05/14 1402 96 %     Weight 12/05/14 1402 238 lb (107.956 kg)     Height 12/05/14 1402  (1.905 m)     Head Cir --      Peak Flow --      Pain Score 12/05/14 1419 3     Pain Loc --      Pain Edu? --      Excl. in GC? --     General: Awake , Alert , and Oriented times 3; GCS 15 Head: Normal cephalic , atraumatic Eyes: Pupils equal , round, reactive to light Nose/Throat: No nasal drainage, patent upper airway without erythema or exudate.  Neck: Supple, Full range of motion, No anterior adenopathy or palpable thyroid masses Lungs: Clear to ascultation without wheezes , rhonchi, or rales Heart: Regular rate, regular rhythm without murmurs ,  gallops , or rubs Abdomen: Soft, non tender without rebound, guarding , or rigidity; bowel sounds positive and symmetric in all 4 quadrants. No organomegaly .        Extremities: 2 plus symmetric pulses. No edema, clubbing or cyanosis Neurologic: normal ambulation, Motor symmetric without deficits, sensory intact Skin: warm, dry, no rashes   Labs:   All laboratory work was reviewed including any pertinent negatives or positives listed below:  Labs Reviewed  TROPONIN I - Abnormal; Notable for the following:    Troponin I 0.04 (*)    All other components within normal limits  CBC - Abnormal; Notable for the following:    MCHC 31.9  (*)    RDW 15.8 (*)    All other components within normal limits  BASIC METABOLIC PANEL   patient received serial troponins first one was 0.0 for repeat was 0.05  EKG:  ED ECG REPORT I, Leara Rawl S Iyana Topor, the attendiJennye Moccasinng physician, personally viewed and interpreted this ECG.  Date: 12/05/2014 EKG Time: *1400 Rate: 76 Rhythm: normal sinus rhythm QRS Axis: normal Intervals: normal ST/T Wave abnormalities: Nonspecific ST-T wave abnormality Narrative Interpretation: unremarkable    Radiology:  EXAM: CT ANGIOGRAPHY CHEST WITH CONTRAST  TECHNIQUE: Multidetector CT imaging of the chest was performed using the standard protocol during bolus administration of intravenous contrast. Multiplanar CT image reconstructions and MIPs were obtained to evaluate the vascular anatomy.  CONTRAST: 100mL OMNIPAQUE IOHEXOL 350 MG/ML SOLN  COMPARISON: Chest x-ray today and 12/13/2012  FINDINGS: Lungs are well inflated and demonstrate no focal consolidation or effusion. There is linear scarring/ atelectasis over the right lower lobe. 3 mm nodule over the right upper lobe. 3-4 mm nodule over the left upper lobe. Subtle bronchial wall thickening over the lower lobes.  There is mild cardiomegaly. Sub cm right pericardial phrenic lymph node is present. No evidence of pulmonary embolism. Mild calcified plaque over the left lateral circumflex coronary artery. Mild calcified plaque over the thoracic aorta. No significant hilar or mediastinal adenopathy. Mildly prominent left axillary lymph node measuring 1.5 cm by short axis.  Possible minimal bilateral gynecomastia right worse than left.  Images through the upper abdomen demonstrate a sub cm hypodensity over the left lobe of the liver likely a cyst. There is also a 1.7 cm hypodensity partially visualized over the right lobe of the liver likely a cyst. 1.8 cm hypodensity over the upper pole right kidney with Hounsfield unit measurements 22. 2.5  cm right adrenal mass with Hounsfield unit measurements 26 likely an adenoma. There mild degenerate changes of the spine.  Review of the MIP images confirms the above findings.  IMPRESSION: Minimal bronchial wall thickening over the lower lobes which may be due to an acute bronchitic process. Linear atelectasis/ scarring right lower lobe.  Couple tiny pulmonary nodules as described with the larger over the left upper lobe measuring 3-4 mm. Recommend follow-up noncontrast chest CT 1 year. This recommendation follows the consensus statement: Guidelines for Management of Small Pulmonary Nodules Detected on CT Scans: A Statement from the Fleischner Society as published in Radiology 2005; 237:395-400. Online at: [email or web address].[employer].edu/rad/res/Fleischner-nodule.htm.  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  Couple liver hypodensities likely cysts.  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  1.8 cm hypodensity over the upper pole right renal cortex likely a cyst, but indeterminate. Recommend followup CT 6 months pre and post-contrast.   Electronically Signed By: Elberta Fortisaniel Boyle M.D.   I personally reviewed the radiologic  studies     ED Course: Differential includes all life-threatening causes for chest pain. This includes but is not exclusive to acute coronary syndrome, aortic dissection, pulmonary embolism, cardiac tamponade, community-acquired pneumonia, pericarditis, musculoskeletal chest wall pain, etc. The patient's chest discomfort was somewhat atypical with her being right side with really no associated symptoms. Patient does have multiple cardiovascular risk factors including smoking hypertension, family history, etc. Patient was advised to stay in the hospital for further inpatient testing such as serial blood work and possibly objective studies such as a stress echocardiogram, etc. Declines at this point after  speaking to the hospitalist team. He wishes to be discharged and follow up with his primary physician. I also referred him to cardiology unassigned for further outpatient management. He's been advised to return here to the emergency department especially if he wishes to continue his evaluation, pain changes in its characteristics were location, or any new concerns. Patient was cautioned with the risk of going home with his family member present. He is advised if he does decide to be leaving (which he did) see that he should take an aspirin a day in the meantime.   Assessment:  Acute unspecified chest pain  Final Clinical Impression:  Final diagnoses:  Chest pain     Plan:  Outpatient management at the patient's request  Patient was advised to return immediately if condition worsens. Patient was advised to follow up with her primary care physician or other specialized physicians involved and in their current assessment.            Jennye Moccasin, MD 12/05/14 [phone]

## 2014-12-07 ENCOUNTER — Inpatient Hospital Stay
Admission: EM | Admit: 2014-12-07 | Discharge: 2014-12-09 | DRG: 247 | Disposition: A | Discharge status: Home / Self Care | Payer: Medicare Other | Attending: Internal Medicine | Admitting: Internal Medicine

## 2014-12-07 ENCOUNTER — Encounter: Payer: Self-pay | Admitting: Emergency Medicine

## 2014-12-07 ENCOUNTER — Inpatient Hospital Stay: Payer: Medicare Other

## 2014-12-07 DIAGNOSIS — F17200 Nicotine dependence, unspecified, uncomplicated: Secondary | ICD-10-CM | POA: Diagnosis present

## 2014-12-07 DIAGNOSIS — Z823 Family history of stroke: Secondary | ICD-10-CM

## 2014-12-07 DIAGNOSIS — E669 Obesity, unspecified: Secondary | ICD-10-CM | POA: Diagnosis present

## 2014-12-07 DIAGNOSIS — Z6829 Body mass index (BMI) 29.0-29.9, adult: Secondary | ICD-10-CM | POA: Diagnosis not present

## 2014-12-07 DIAGNOSIS — I25111 Atherosclerotic heart disease of native coronary artery with angina pectoris with documented spasm: Secondary | ICD-10-CM | POA: Diagnosis not present

## 2014-12-07 DIAGNOSIS — I2510 Atherosclerotic heart disease of native coronary artery without angina pectoris: Secondary | ICD-10-CM | POA: Diagnosis not present

## 2014-12-07 DIAGNOSIS — I209 Angina pectoris, unspecified: Secondary | ICD-10-CM | POA: Diagnosis not present

## 2014-12-07 DIAGNOSIS — I214 Non-ST elevation (NSTEMI) myocardial infarction: Principal | ICD-10-CM | POA: Diagnosis present

## 2014-12-07 DIAGNOSIS — E785 Hyperlipidemia, unspecified: Secondary | ICD-10-CM | POA: Diagnosis present

## 2014-12-07 DIAGNOSIS — K7689 Other specified diseases of liver: Secondary | ICD-10-CM | POA: Diagnosis present

## 2014-12-07 DIAGNOSIS — J449 Chronic obstructive pulmonary disease, unspecified: Secondary | ICD-10-CM | POA: Diagnosis present

## 2014-12-07 DIAGNOSIS — R072 Precordial pain: Secondary | ICD-10-CM | POA: Diagnosis present

## 2014-12-07 DIAGNOSIS — I10 Essential (primary) hypertension: Secondary | ICD-10-CM | POA: Diagnosis present

## 2014-12-07 DIAGNOSIS — R001 Bradycardia, unspecified: Secondary | ICD-10-CM | POA: Diagnosis present

## 2014-12-07 DIAGNOSIS — Z720 Tobacco use: Secondary | ICD-10-CM | POA: Diagnosis not present

## 2014-12-07 DIAGNOSIS — Z8249 Family history of ischemic heart disease and other diseases of the circulatory system: Secondary | ICD-10-CM

## 2014-12-07 HISTORY — DX: Tobacco use: Z72.0

## 2014-12-07 LAB — BASIC METABOLIC PANEL
ANION GAP: 8 (ref 5–15)
BUN: 9 mg/dL (ref 6–20)
CALCIUM: 9.3 mg/dL (ref 8.9–10.3)
CO2: 32 mmol/L (ref 22–32)
CREATININE: 0.9 mg/dL (ref 0.61–1.24)
Chloride: 97 mmol/L — ABNORMAL LOW (ref 101–111)
GFR calc Af Amer: >60 mL/min (ref >60)
GFR calc non Af Amer: >60 mL/min (ref >60)
GLUCOSE: 156 mg/dL — AB (ref 65–99)
POTASSIUM: 3.5 mmol/L (ref 3.5–5.1)
SODIUM: 137 mmol/L (ref 135–145)

## 2014-12-07 LAB — DIFFERENTIAL
Basophils Absolute: 0 10*3/uL (ref 0–0.1)
EOS ABS: 0.1 10*3/uL (ref 0–0.7)
Eosinophils Relative: 1 %
LYMPHS ABS: 1.6 10*3/uL (ref 1.0–3.6)
Lymphocytes Relative: 16 %
MONO ABS: 0.7 10*3/uL (ref 0.2–1.0)
NEUTROS ABS: 7.7 10*3/uL — AB (ref 1.4–6.5)
Neutrophils Relative %: 76 %

## 2014-12-07 LAB — PROTIME-INR
INR: 1.07
Prothrombin Time: 14.1 seconds (ref 11.4–15.0)

## 2014-12-07 LAB — CBC
HEMATOCRIT: 49.2 % (ref 40.0–52.0)
HEMOGLOBIN: 15.8 g/dL (ref 13.0–18.0)
MCH: 27.5 pg (ref 26.0–34.0)
MCHC: 31.1 g/dL — AB (ref 32.0–36.0)
MCV: 88.2 fL (ref 80.0–100.0)
Platelets: 200 10*3/uL (ref 150–440)
RBC: 5.55 MIL/uL (ref 4.40–5.90)
RDW: 15.6 % — AB (ref 11.5–14.5)
WBC: 10.1 10*3/uL (ref 3.8–10.6)

## 2014-12-07 LAB — TROPONIN I
TROPONIN I: 2.37 ng/mL — AB (ref <0.031)
Troponin I: 2.05 ng/mL — ABNORMAL HIGH (ref <0.031)

## 2014-12-07 LAB — APTT: APTT: 33 s (ref 24–36)

## 2014-12-07 MED ORDER — ACETAMINOPHEN 650 MG RE SUPP: 650.0000 mg | Freq: Four times a day (QID) | RECTAL | Status: DC | PRN

## 2014-12-07 MED ORDER — ONDANSETRON HCL 4 MG/2ML IJ SOLN: 4.0000 mg | Freq: Four times a day (QID) | INTRAMUSCULAR | Status: DC | PRN

## 2014-12-07 MED ORDER — ATORVASTATIN CALCIUM 20 MG PO TABS: 20.0000 mg | ORAL_TABLET | Freq: Every day | ORAL | Status: DC

## 2014-12-07 MED ORDER — HYDROCODONE-ACETAMINOPHEN 5-325 MG PO TABS
1.0000 | ORAL_TABLET | ORAL | Status: DC | PRN
  Administered 2014-12-09: 1 via ORAL
  Filled 2014-12-07: qty 1

## 2014-12-07 MED ORDER — ALUM & MAG HYDROXIDE-SIMETH 200-200-20 MG/5ML PO SUSP
30.0000 mL | Freq: Four times a day (QID) | ORAL | Status: DC | PRN
Start: 2014-12-07 — End: 2014-12-09

## 2014-12-07 MED ORDER — FLUTICASONE FUROATE-VILANTEROL 100-25 MCG/INH IN AEPB: 1.0000 | INHALATION_SPRAY | Freq: Every day | RESPIRATORY_TRACT | Status: DC

## 2014-12-07 MED ORDER — SENNOSIDES-DOCUSATE SODIUM 8.6-50 MG PO TABS: 1.0000 | ORAL_TABLET | Freq: Every evening | ORAL | Status: DC | PRN

## 2014-12-07 MED ORDER — NITROGLYCERIN 2 % TD OINT
1.0000 [in_us] | TOPICAL_OINTMENT | Freq: Four times a day (QID) | TRANSDERMAL | Status: DC
  Administered 2014-12-07 – 2014-12-08 (×3): 1 [in_us] via TOPICAL
  Filled 2014-12-07 (×2): qty 1

## 2014-12-07 MED ORDER — ACETAMINOPHEN 500 MG PO TABS: 1000.0000 mg | ORAL_TABLET | Freq: Four times a day (QID) | ORAL | Status: DC | PRN

## 2014-12-07 MED ORDER — ASPIRIN EC 81 MG PO TBEC
81.0000 mg | DELAYED_RELEASE_TABLET | Freq: Every day | ORAL | Status: DC
  Administered 2014-12-07 – 2014-12-09 (×2): 81 mg via ORAL
  Filled 2014-12-07 (×2): qty 1

## 2014-12-07 MED ORDER — SODIUM CHLORIDE 0.9 % IV SOLN
INTRAVENOUS | Status: DC
  Administered 2014-12-07: 21:00:00 via INTRAVENOUS

## 2014-12-07 MED ORDER — HYDRALAZINE HCL 20 MG/ML IJ SOLN
10.0000 mg | Freq: Four times a day (QID) | INTRAMUSCULAR | Status: DC | PRN
  Filled 2014-12-07: qty 1

## 2014-12-07 MED ORDER — LISINOPRIL 20 MG PO TABS
20.0000 mg | ORAL_TABLET | Freq: Every day | ORAL | Status: DC
  Administered 2014-12-07 – 2014-12-09 (×3): 20 mg via ORAL
  Filled 2014-12-07 (×3): qty 1

## 2014-12-07 MED ORDER — HYDROCHLOROTHIAZIDE 25 MG PO TABS
25.0000 mg | ORAL_TABLET | Freq: Every day | ORAL | Status: DC
  Administered 2014-12-07 – 2014-12-09 (×3): 25 mg via ORAL
  Filled 2014-12-07 (×3): qty 1

## 2014-12-07 MED ORDER — MOMETASONE FURO-FORMOTEROL FUM 100-5 MCG/ACT IN AERO
2.0000 | INHALATION_SPRAY | Freq: Two times a day (BID) | RESPIRATORY_TRACT | Status: DC
  Administered 2014-12-08 – 2014-12-09 (×3): 2 via RESPIRATORY_TRACT
  Filled 2014-12-07: qty 8.8

## 2014-12-07 MED ORDER — SODIUM CHLORIDE 0.9 % IJ SOLN: 3.0000 mL | Freq: Two times a day (BID) | INTRAMUSCULAR | Status: DC

## 2014-12-07 MED ORDER — LISINOPRIL-HYDROCHLOROTHIAZIDE 20-25 MG PO TABS: 1.0000 | ORAL_TABLET | Freq: Every day | ORAL | Status: DC

## 2014-12-07 MED ORDER — ACETAMINOPHEN 325 MG PO TABS
650.0000 mg | ORAL_TABLET | Freq: Four times a day (QID) | ORAL | Status: DC | PRN
  Administered 2014-12-07: 650 mg via ORAL
  Filled 2014-12-07: qty 2

## 2014-12-07 MED ORDER — SODIUM CHLORIDE 0.9 % IJ SOLN: 3.0000 mL | INTRAMUSCULAR | Status: DC | PRN

## 2014-12-07 MED ORDER — METOPROLOL TARTRATE 50 MG PO TABS
50.0000 mg | ORAL_TABLET | Freq: Two times a day (BID) | ORAL | Status: DC
  Administered 2014-12-07 – 2014-12-09 (×4): 50 mg via ORAL
  Filled 2014-12-07 (×4): qty 1

## 2014-12-07 MED ORDER — NICOTINE 21 MG/24HR TD PT24
21.0000 mg | MEDICATED_PATCH | Freq: Every day | TRANSDERMAL | Status: DC
  Administered 2014-12-07 – 2014-12-09 (×3): 21 mg via TRANSDERMAL
  Filled 2014-12-07 (×3): qty 1

## 2014-12-07 MED ORDER — HEPARIN BOLUS VIA INFUSION
4000.0000 [IU] | Freq: Once | INTRAVENOUS | Status: AC
  Administered 2014-12-07: 4000 [IU] via INTRAVENOUS
  Filled 2014-12-07: qty 4000

## 2014-12-07 MED ORDER — NITROGLYCERIN 0.4 MG SL SUBL
0.4000 mg | SUBLINGUAL_TABLET | SUBLINGUAL | Status: DC | PRN
  Administered 2014-12-07 – 2014-12-09 (×6): 0.4 mg via SUBLINGUAL
  Filled 2014-12-07 (×3): qty 1

## 2014-12-07 MED ORDER — ASPIRIN 81 MG PO CHEW
324.0000 mg | CHEWABLE_TABLET | Freq: Once | ORAL | Status: AC
  Administered 2014-12-07: 324 mg via ORAL

## 2014-12-07 MED ORDER — SODIUM CHLORIDE 0.9 % WEIGHT BASED INFUSION
1.0000 mL/kg/h | INTRAVENOUS | Status: DC
  Administered 2014-12-08: 1 mL/kg/h via INTRAVENOUS

## 2014-12-07 MED ORDER — HEPARIN (PORCINE) IN NACL 100-0.45 UNIT/ML-% IJ SOLN
1300.0000 [IU]/h | INTRAMUSCULAR | Status: DC
  Administered 2014-12-07 (×2): 1300 [IU]/h via INTRAVENOUS
  Filled 2014-12-07 (×2): qty 250

## 2014-12-07 MED ORDER — ONDANSETRON HCL 4 MG PO TABS: 4.0000 mg | ORAL_TABLET | Freq: Four times a day (QID) | ORAL | Status: DC | PRN

## 2014-12-07 MED ORDER — MORPHINE SULFATE (PF) 2 MG/ML IV SOLN: 1.0000 mg | INTRAVENOUS | Status: DC | PRN

## 2014-12-07 MED ORDER — SODIUM CHLORIDE 0.9 % IV SOLN: 250.0000 mL | INTRAVENOUS | Status: DC | PRN

## 2014-12-07 MED ORDER — SODIUM CHLORIDE 0.9 % WEIGHT BASED INFUSION
3.0000 mL/kg/h | INTRAVENOUS | Status: DC
  Administered 2014-12-08: 3 mL/kg/h via INTRAVENOUS

## 2014-12-07 MED ORDER — ASPIRIN 81 MG PO CHEW
81.0000 mg | CHEWABLE_TABLET | ORAL | Status: AC
  Administered 2014-12-08: 81 mg via ORAL
  Filled 2014-12-07: qty 1

## 2014-12-07 NOTE — Progress Notes (Signed)
Consent signed for cahterization

## 2014-12-07 NOTE — Progress Notes (Signed)
Heparin drip started at 1013ml/hr with bolus of 4000, verifying RN, CK.

## 2014-12-07 NOTE — Progress Notes (Signed)
ANTICOAGULATION CONSULT NOTE - Initial Consult  Pharmacy Consult for heparin Indication: chest pain/ACS  No Known Allergies  Patient Measurements: Height: 6' 2.5" (189.2 cm) Weight: 220 lb 3.2 oz (99.882 kg) IBW/kg (Calculated) : 83.35 Heparin Dosing Weight: 99.9 kg  Vital Signs: Temp: 97.8 F (36.6 C) (11/27 2017) Temp Source: Oral (11/27 2017) BP: 164/87 mmHg (11/27 2017) Pulse Rate: 70 (11/27 2017)  Labs:  Recent Labs  12/05/14 1504 12/05/14 1834 12/07/14 1807 12/07/14 2031  HGB 15.2  --  15.8  --   HCT 47.5  --  49.2  --   PLT 198  --  200  --   APTT  --   --  33  --   LABPROT  --   --  14.1  --   INR  --   --  1.07  --   CREATININE 0.98  --  0.90  --   TROPONINI 0.04* 0.05* 2.05* 2.37*    Estimated Creatinine Clearance: 84.9 mL/min (by C-G formula based on Cr of 0.9).   Medical History: Past Medical History  Diagnosis Date  . COPD (chronic obstructive pulmonary disease) (HCC)   . Hypertension     Medications:  Infusions:  . sodium chloride 75 mL/hr at 12/07/14 2116  . [START ON 12/08/2014] sodium chloride     Followed by  . [START ON 12/08/2014] sodium chloride    . heparin 1,300 Units/hr (12/07/14 2108)    Assessment: 74 yom cc chest pain, substernal with radiating to right arm. Troponin > 2 x 1 will serial. Plan for cath tomorrow. Starting UFH.   Goal of Therapy:  Heparin level 0.3-0.7 units/ml Monitor platelets by anticoagulation protocol: Yes   Plan:  Give 4000 units bolus x 1 Start heparin infusion at 1300 units/hr Check anti-Xa level in 8 hours and daily while on heparin Continue to monitor H&H and platelets  Carola FrostNathan A Shadd Dunstan, Pharm.D., BCPS Clinical Pharmacist 12/07/2014,11:22 PM

## 2014-12-07 NOTE — ED Provider Notes (Signed)
[facility] Emergency Department Provider Note   ____________________________________________  Time seen: Upon ED arrival I have reviewed the triage vital signs and the triage nursing note.  HISTORY  Chief Complaint Chest Pain   Historian Patient and son  HPI Jacob Ford is a 74 y.o. male with a history of hypertension COPD and recent chest pain, is here for evaluation of chest pain which is been ongoing all day. Patient was seen in the emergency department on Friday with complaint of chest pain and was found to have a troponin elevation from 0.04-0.05 and was recommended to stay in the hospital, the patient chose to go home. Patient states he did have some chest pain yesterday for which he took Tylenol and that helped. Today he has had central and left-sided chest pain all day long. He's had some nausea without vomiting. No shortness of breath or trouble breathing. No fever. No coughing.    Past Medical History  Diagnosis Date  . COPD (chronic obstructive pulmonary disease) (HCC)   . Hypertension     Patient Active Problem List   Diagnosis Date Noted  . NSTEMI (non-ST elevated myocardial infarction) (HCC) 12/07/2014    Past Surgical History  Procedure Laterality Date  . Shoulder surgery Left   . Cardiac catheterization      Current Outpatient Rx  Name  Route  Sig  Dispense  Refill  . acetaminophen (TYLENOL) 500 MG tablet   Oral   Take 1,000 mg by mouth every 6 (six) hours as needed for mild pain.         [name] Fluticasone Furoate-Vilanterol (BREO ELLIPTA) 100-25 MCG/INH AEPB   Inhalation   Inhale 1 puff into the lungs daily.          [name] lisinopril-hydrochlorothiazide (PRINZIDE,ZESTORETIC) 20-25 MG tablet   Oral   Take 1 tablet by mouth daily.         . meloxicam (MOBIC) 15 MG tablet   Oral   Take 15 mg by mouth daily.         . metoprolol (LOPRESSOR) 50 MG tablet   Oral   Take 50 mg by mouth 2 (two) times daily.            Allergies Review of patient's allergies indicates no known allergies.  No family history on file.  Social History Social History  Substance Use Topics  . Smoking status: Current Every Day Smoker    Types: Cigarettes  . Smokeless tobacco: None  . Alcohol Use: No    Review of Systems  Constitutional: Negative for fever. Eyes: Negative for visual changes. ENT: Negative for sore throat. Cardiovascular: Positive for chest pain. Respiratory: Negative for shortness of breath. Gastrointestinal: Negative for abdominal pain, vomiting and diarrhea. Genitourinary: Negative for dysuria. Musculoskeletal: Negative for back pain. Skin: Negative for rash. Neurological: Negative for headache. 10 point Review of Systems otherwise negative ____________________________________________   PHYSICAL EXAM:  VITAL SIGNS: ED Triage Vitals  Enc Vitals Group     BP 12/07/14 1806 190/175 mmHg     Pulse Rate 12/07/14 1806 85     Resp 12/07/14 1806 24     Temp 12/07/14 1806 98 F (36.7 C)     Temp Source 12/07/14 1806 Oral     SpO2 12/07/14 1806 95 %     Weight 12/07/14 1806 230 lb (104.327 kg)     Height 12/07/14 1806 6' 2.5" (1.892 m)     Head Cir --      Peak  Flow --      Pain Score 12/07/14 1808 6     Pain Loc --      Pain Edu? --      Excl. in GC? --      Constitutional: Alert and oriented. Well appearing and in no distress. Eyes: Conjunctivae are normal. PERRL. Normal extraocular movements. ENT   Head: Normocephalic and atraumatic.   Nose: No congestion/rhinnorhea.   Mouth/Throat: Mucous membranes are moist.   Neck: No stridor. Cardiovascular/Chest: Normal rate, regular rhythm.  No murmurs, rubs, or gallops. Respiratory: Normal respiratory effort without tachypnea nor retractions. Breath sounds are clear and equal bilaterally. No wheezes/rales/rhonchi. Gastrointestinal: Soft. No distention, no guarding, no rebound. Nontender    Genitourinary/rectal:Deferred Musculoskeletal: Nontender with normal range of motion in all extremities. No joint effusions.  No lower extremity tenderness.  No edema. Neurologic:  Normal speech and language. No gross or focal neurologic deficits are appreciated. Skin:  Skin is warm, dry and intact. No rash noted. Psychiatric: Mood and affect are normal. Speech and behavior are normal. Patient exhibits appropriate insight and judgment.  ____________________________________________   EKG I, Governor Rooks, MD, the attending physician have personally viewed and interpreted all ECGs.  81 bpm. Normal sinus rhythm. Narrow QRS. Normal axis. Nonspecific ST segment with ST depression laterally. ____________________________________________  LABS (pertinent positives/negatives)  CBC and metabolic panel without significant abnormalities and troponin 2.05  ____________________________________________  RADIOLOGY All Xrays were viewed by me. Imaging interpreted by Radiologist.  Chest x-ray portable:  No acute abnormalities or significant interval change. __________________________________________  PROCEDURES  Procedure(s) performed: None  Critical Care performed: CRITICAL CARE Performed by: Governor Rooks   Total critical care time: 45 minutes  Critical care time was exclusive of separately billable procedures and treating other patients.  Critical care was necessary to treat or prevent imminent or life-threatening deterioration.  Critical care was time spent personally by me on the following activities: development of treatment plan with patient and/or surrogate as well as nursing, discussions with consultants, evaluation of patient's response to treatment, examination of patient, obtaining history from patient or surrogate, ordering and performing treatments and interventions, ordering and review of laboratory studies, ordering and review of radiographic studies, pulse oximetry and  re-evaluation of patient's condition.   ____________________________________________   ED COURSE / ASSESSMENT AND PLAN  CONSULTATIONS: Hospitalist for admission  Pertinent labs & imaging results that were available during my care of the patient were reviewed by me and considered in my medical decision making (see chart for details).  Patient arrived significantly hypertensive with significant chest discomfort concerning for acute coronary syndrome. His EKG showed ST segment depression laterally. No criteria for STEMI.  Patient was given aspirin 324 upon arrival. He was also given sublingual nitroglycerin which did alleviate his pain. Reviewed his history indicates he is here on Friday with chest pain and was initially recommended to stay, however patient wanted to go home and follow-up with Dr. Gwen Pounds on Monday which is scheduled. However today he had more significant pain all day and chose to come in for reevaluation.  Troponin came back elevated to 2.05. I discussed with the patient and he is placed on heparin bolus and drip for acute Kari syndrome. Patient's chest pain did work turn and he was given a second subungual nitroglycerin which did again alleviate his pain to 0. Nitroglycerin paste was placed.  I discussed the case with the hospitalist doctor Mody for admission.  Patient / Family / Caregiver informed of clinical course, medical  decision-making process, and agree with plan.    ___________________________________________   FINAL CLINICAL IMPRESSION(S) / ED DIAGNOSES   Final diagnoses:  NSTEMI (non-ST elevated myocardial infarction) (HCC)  Precordial pain       Governor Rooksebecca Ravinder Lukehart, MD 12/07/14 2025

## 2014-12-07 NOTE — ED Notes (Signed)
Patient presents to the ED for chest pain and shortness of breath.  Patient reports feeling chest pain starting two days ago.  Patient reports pain mostly on the right side of his chest and pain is feeling very sore.  Patient is alert and oriented at this time.

## 2014-12-07 NOTE — ED Notes (Signed)
X-ray at bedside at this time.

## 2014-12-07 NOTE — Progress Notes (Signed)
Pt. Arrived to unit via stretcher. Pt. Walked from stretcher to bed without staff assistance. Tele applied. General room orientation given. How to use call bell and ascom system. Fall risk sign signed.  Pt. Is A&O. No signs or c/o pain. No acute distress noted. Sons are at bedside. Bed alarm on.    Skin assessment performed, no skin issues noted. [facility] by [facility],RN

## 2014-12-07 NOTE — H&P (Signed)
[facility] - [place] at [facility]   PATIENT NAME: Jacob Ford    MR#:  [id number]  DATE OF BIRTH:  1940-10-23  DATE OF ADMISSION:  12/07/2014  PRIMARY CARE PHYSICIAN: Derwood Kaplan, MD   REQUESTING/REFERRING PHYSICIAN: Dr Shaune Pollack  CHIEF COMPLAINT:  Chest pain   HISTORY OF PRESENT ILLNESS:  Jacob Ford  is a 74 y.o. male with a known history of HTN and tobacco abuse who presents with substernal chest pain radiating to his right arm. This is a remission as of breath and dyspnea physician. Patient actually presented on Friday with chest pain. He had 2 troponins that were 0.04 and 0.05. Patient was adamant about leaving and therefore went home. His chest pain over the weekend has increased in duration and frequency and therefore his son brought him here for further evaluation. In emergency room, his troponin was greater than 2. He is started on heparin drip. After consent. He was also given nitroglycerin and he is chest pain-free. I have spoken with the cardiologist on-call, and patient will have a cardiac catheterization for tomorrow. Patient is agreeable for this.  PAST MEDICAL HISTORY:   Past Medical History  Diagnosis Date  . COPD (chronic obstructive pulmonary disease) (HCC)   . Hypertension     PAST SURGICAL HISTORY:   Past Surgical History  Procedure Laterality Date  . Shoulder surgery Left   . Cardiac catheterization      SOCIAL HISTORY:   Social History  Substance Use Topics  . Smoking status: Current Every Day Smoker    Types: Cigarettes  . Smokeless tobacco: Not on file  . Alcohol Use: No    FAMILY HISTORY:  Positive hypertension  DRUG ALLERGIES:  No Known Allergies   REVIEW OF SYSTEMS:  CONSTITUTIONAL: No fever, fatigue or weakness.  EYES: No blurred or double vision.  EARS, NOSE, AND THROAT: No tinnitus or ear pain.  RESPIRATORY: No cough, shortness of breath, wheezing or hemoptysis.  CARDIOVASCULAR: ++ chest pain,NO   orthopnea, NOedema. Shortness of breath and dyspnea physician associated with his chest pain only GASTROINTESTINAL: No nausea, vomiting, diarrhea or abdominal pain.  GENITOURINARY: No dysuria, hematuria.  ENDOCRINE: No polyuria, nocturia,  HEMATOLOGY: No anemia, easy bruising or bleeding SKIN: No rash or lesion. MUSCULOSKELETAL: No joint pain or arthritis.   NEUROLOGIC: No tingling, numbness, weakness.  PSYCHIATRY: No anxiety or depression.   MEDICATIONS AT HOME:   Prior to Admission medications   Medication Sig Start Date End Date Taking? Authorizing Provider  acetaminophen (TYLENOL) 500 MG tablet Take 1,000 mg by mouth every 6 (six) hours as needed for mild pain.    Historical Provider, MD  Fluticasone Furoate-Vilanterol (BREO ELLIPTA) 100-25 MCG/INH AEPB Inhale 1 puff into the lungs daily.     Historical Provider, MD  lisinopril-hydrochlorothiazide (PRINZIDE,ZESTORETIC) 20-25 MG tablet Take 1 tablet by mouth daily.    Historical Provider, MD  meloxicam (MOBIC) 15 MG tablet Take 15 mg by mouth daily.    Historical Provider, MD  metoprolol (LOPRESSOR) 50 MG tablet Take 50 mg by mouth 2 (two) times daily.    Historical Provider, MD      VITAL SIGNS:  Blood pressure 164/98, pulse 77, temperature 98 F (36.7 C), temperature source Oral, resp. rate 18, height 6' 2.5" (1.892 m), weight 104.327 kg (230 lb), SpO2 94 %.  PHYSICAL EXAMINATION:  GENERAL:  74 y.o.-year-old patient lying in the bed with no acute distress.  EYES: Pupils equal, round, reactive to light and  accommodation. No scleral icterus. Extraocular muscles intact.  HEENT: Head atraumatic, normocephalic. Oropharynx and nasopharynx clear.  NECK:  Supple, no jugular venous distention. No thyroid enlargement, no tenderness.  LUNGS: Normal breath sounds bilaterally, no wheezing, rales,rhonchi or crepitation. No use of accessory muscles of respiration.  CARDIOVASCULAR: S1, S2 normal. 2/6 SEM NO rubs, or gallops.  ABDOMEN: Soft,  nontender, nondistended. Bowel sounds present. No organomegaly or mass.  EXTREMITIES: No pedal edema, cyanosis, or clubbing.  NEUROLOGIC: Cranial nerves II through XII are grossly intact. No focal deficits. PSYCHIATRIC: The patient is alert and oriented x 3.  SKIN: No obvious rash, lesion, or ulcer.   LABORATORY PANEL:   CBC  Recent Labs Lab 12/07/14 1807  WBC 10.1  HGB 15.8  HCT 49.2  PLT 200   ------------------------------------------------------------------------------------------------------------------  Chemistries   Recent Labs Lab 12/07/14 1807  NA 137  K 3.5  CL 97*  CO2 32  GLUCOSE 156*  BUN 9  CREATININE 0.90  CALCIUM 9.3   ------------------------------------------------------------------------------------------------------------------  Cardiac Enzymes  Recent Labs Lab 12/05/14 1504 12/05/14 1834 12/07/14 1807  TROPONINI 0.04* 0.05* 2.05*   ------------------------------------------------------------------------------------------------------------------  RADIOLOGY:  No results found.  EKG:   Normal sinus rhythm with mild ST depression in the lateral leads.  IMPRESSION AND PLAN:   This is a 74 year old male with a history of essential hypertension and tobacco abuse who presents with chest pain.  1. Non-ST elevation MI: The patient has currently ruled out in for non-ST elevation MI. Patient will need to undergo cardiac catheterization. Patient has been consented for heparin. I have personally reviewed side effects, alternatives, benefits and risk of heparin not limited to bleeding. Patient accepts these risks. Patient will continue on beta blocker. Patient has been started on aspirin and statin. I have spoken with cardiology on-call, and patient will undergo cardiac catheterization in a.m. Patient is nothing by mouth after midnight.  2. Tobacco dependence: Patient does not want to quit smoking at this time. However, he is highly encouraged to do  so. Patient is counseled for 3 minutes.  3. Essential hypertension: Patient will continue his outpatient medications.  4. Obesity: Patient is counseled for diet and exercise.   5. COPD: This appears to be stable at this point. All the records are reviewed and case discussed with ED provider. Management plans discussed with the patient and he is in agreement.  CODE STATUS: FULL  TOTAL TIME TAKING CARE OF THIS PATIENT: 50 minutes.    Sorcha Rotunno M.D on 12/07/2014 at 7:12 PM  Between 7am to 6pm - Pager - [phone] After 6pm go to [email or web address] - password [email or web address] [facility]  [facility]  Office  [phone]  CC: Primary care physician; Derwood KaplanEason,  Ernest B, MD

## 2014-12-07 NOTE — ED Notes (Signed)
MD Lord at bedside. 

## 2014-12-08 ENCOUNTER — Encounter: Payer: Self-pay | Admitting: Physician Assistant

## 2014-12-08 ENCOUNTER — Encounter
Admission: EM | Disposition: A | Discharge status: Home / Self Care | Payer: Self-pay | Source: Home / Self Care | Attending: Internal Medicine

## 2014-12-08 DIAGNOSIS — I214 Non-ST elevation (NSTEMI) myocardial infarction: Principal | ICD-10-CM

## 2014-12-08 DIAGNOSIS — I2510 Atherosclerotic heart disease of native coronary artery without angina pectoris: Secondary | ICD-10-CM

## 2014-12-08 HISTORY — PX: CARDIAC CATHETERIZATION: SHX172

## 2014-12-08 LAB — CBC
HCT: 43.1 % (ref 40.0–52.0)
Hemoglobin: 13.7 g/dL (ref 13.0–18.0)
MCH: 27.5 pg (ref 26.0–34.0)
MCHC: 31.7 g/dL — AB (ref 32.0–36.0)
MCV: 86.8 fL (ref 80.0–100.0)
PLATELETS: 174 10*3/uL (ref 150–440)
RBC: 4.96 MIL/uL (ref 4.40–5.90)
RDW: 15.6 % — AB (ref 11.5–14.5)
WBC: 10.7 10*3/uL — ABNORMAL HIGH (ref 3.8–10.6)

## 2014-12-08 LAB — LIPID PANEL
CHOLESTEROL: 178 mg/dL (ref 0–200)
HDL: 39 mg/dL — ABNORMAL LOW (ref >40)
LDL Cholesterol: 129 mg/dL — ABNORMAL HIGH (ref 0–99)
TRIGLYCERIDES: 51 mg/dL (ref <150)
Total CHOL/HDL Ratio: 4.6 RATIO
VLDL: 10 mg/dL (ref 0–40)

## 2014-12-08 LAB — BASIC METABOLIC PANEL
Anion gap: 7 (ref 5–15)
BUN: 9 mg/dL (ref 6–20)
CALCIUM: 8.6 mg/dL — AB (ref 8.9–10.3)
CO2: 27 mmol/L (ref 22–32)
CREATININE: 0.81 mg/dL (ref 0.61–1.24)
Chloride: 100 mmol/L — ABNORMAL LOW (ref 101–111)
GFR calc Af Amer: >60 mL/min (ref >60)
GLUCOSE: 139 mg/dL — AB (ref 65–99)
Potassium: 3.5 mmol/L (ref 3.5–5.1)
Sodium: 134 mmol/L — ABNORMAL LOW (ref 135–145)

## 2014-12-08 LAB — HEMOGLOBIN A1C: HEMOGLOBIN A1C: 6.6 % — AB (ref 4.0–6.0)

## 2014-12-08 LAB — HEPARIN LEVEL (UNFRACTIONATED): HEPARIN UNFRACTIONATED: 0.4 [IU]/mL (ref 0.30–0.70)

## 2014-12-08 LAB — TROPONIN I: TROPONIN I: 5.28 ng/mL — AB (ref <0.031)

## 2014-12-08 SURGERY — LEFT HEART CATH AND CORONARY ANGIOGRAPHY
Anesthesia: Moderate Sedation

## 2014-12-08 MED ORDER — SODIUM CHLORIDE 0.9 % IV SOLN
INTRAVENOUS | Status: AC
  Administered 2014-12-08: 10:00:00 via INTRAVENOUS

## 2014-12-08 MED ORDER — IPRATROPIUM-ALBUTEROL 0.5-2.5 (3) MG/3ML IN SOLN
RESPIRATORY_TRACT | Status: AC
  Filled 2014-12-08: qty 3

## 2014-12-08 MED ORDER — TICAGRELOR 90 MG PO TABS
ORAL_TABLET | ORAL | Status: AC
  Filled 2014-12-08: qty 2

## 2014-12-08 MED ORDER — VERAPAMIL HCL 2.5 MG/ML IV SOLN
INTRAVENOUS | Status: DC | PRN
  Administered 2014-12-08: 2.5 mg via INTRA_ARTERIAL

## 2014-12-08 MED ORDER — BIVALIRUDIN 250 MG IV SOLR
INTRAVENOUS | Status: AC
  Filled 2014-12-08: qty 250

## 2014-12-08 MED ORDER — MIDAZOLAM HCL 2 MG/2ML IJ SOLN
INTRAMUSCULAR | Status: AC
  Filled 2014-12-08: qty 2

## 2014-12-08 MED ORDER — MIDAZOLAM HCL 2 MG/2ML IJ SOLN
INTRAMUSCULAR | Status: DC | PRN
  Administered 2014-12-08: 1 mg via INTRAVENOUS

## 2014-12-08 MED ORDER — FENTANYL CITRATE (PF) 100 MCG/2ML IJ SOLN
INTRAMUSCULAR | Status: AC
  Filled 2014-12-08: qty 2

## 2014-12-08 MED ORDER — ATORVASTATIN CALCIUM 20 MG PO TABS
80.0000 mg | ORAL_TABLET | Freq: Every day | ORAL | Status: DC
  Administered 2014-12-08: 80 mg via ORAL
  Filled 2014-12-08: qty 4

## 2014-12-08 MED ORDER — FENTANYL CITRATE (PF) 100 MCG/2ML IJ SOLN
INTRAMUSCULAR | Status: DC | PRN
  Administered 2014-12-08: 50 ug via INTRAVENOUS

## 2014-12-08 MED ORDER — HEPARIN (PORCINE) IN NACL 2-0.9 UNIT/ML-% IJ SOLN
INTRAMUSCULAR | Status: AC
  Filled 2014-12-08: qty 1000

## 2014-12-08 MED ORDER — HEPARIN SODIUM (PORCINE) 1000 UNIT/ML IJ SOLN
INTRAMUSCULAR | Status: AC
  Filled 2014-12-08: qty 1

## 2014-12-08 MED ORDER — NITROGLYCERIN 1 MG/10 ML FOR IR/CATH LAB
INTRA_ARTERIAL | Status: DC | PRN
  Administered 2014-12-08: 200 ug via INTRACORONARY

## 2014-12-08 MED ORDER — SODIUM CHLORIDE 0.9 % IV SOLN: 250.0000 mL | INTRAVENOUS | Status: DC | PRN

## 2014-12-08 MED ORDER — BIVALIRUDIN BOLUS VIA INFUSION - CUPID
INTRAVENOUS | Status: DC | PRN
  Administered 2014-12-08: 74.925 mg via INTRAVENOUS

## 2014-12-08 MED ORDER — TICAGRELOR 90 MG PO TABS
90.0000 mg | ORAL_TABLET | Freq: Two times a day (BID) | ORAL | Status: DC
  Administered 2014-12-08 – 2014-12-09 (×2): 90 mg via ORAL
  Filled 2014-12-08 (×2): qty 1

## 2014-12-08 MED ORDER — ASPIRIN 81 MG PO CHEW
CHEWABLE_TABLET | ORAL | Status: AC
  Filled 2014-12-08: qty 3

## 2014-12-08 MED ORDER — BIVALIRUDIN 250 MG IV SOLR
250.0000 mg | INTRAVENOUS | Status: DC | PRN
  Administered 2014-12-08: 1.75 mg/kg/h via INTRAVENOUS

## 2014-12-08 MED ORDER — HEPARIN SODIUM (PORCINE) 1000 UNIT/ML IJ SOLN
INTRAMUSCULAR | Status: DC | PRN
  Administered 2014-12-08: 5000 [IU] via INTRAVENOUS

## 2014-12-08 MED ORDER — SODIUM CHLORIDE 0.9 % IJ SOLN
3.0000 mL | Freq: Two times a day (BID) | INTRAMUSCULAR | Status: DC
  Administered 2014-12-08 – 2014-12-09 (×3): 3 mL via INTRAVENOUS

## 2014-12-08 MED ORDER — IPRATROPIUM-ALBUTEROL 0.5-2.5 (3) MG/3ML IN SOLN
3.0000 mL | Freq: Once | RESPIRATORY_TRACT | Status: AC
  Administered 2014-12-08: 3 mL via RESPIRATORY_TRACT

## 2014-12-08 MED ORDER — VERAPAMIL HCL 2.5 MG/ML IV SOLN
INTRAVENOUS | Status: AC
  Filled 2014-12-08: qty 2

## 2014-12-08 MED ORDER — IOHEXOL 300 MG/ML  SOLN
INTRAMUSCULAR | Status: DC | PRN
  Administered 2014-12-08: 165 mL via INTRA_ARTERIAL

## 2014-12-08 MED ORDER — TICAGRELOR 90 MG PO TABS
ORAL_TABLET | ORAL | Status: DC | PRN
  Administered 2014-12-08: 180 mg via ORAL

## 2014-12-08 MED ORDER — SODIUM CHLORIDE 0.9 % IJ SOLN: 3.0000 mL | INTRAMUSCULAR | Status: DC | PRN

## 2014-12-08 MED ORDER — ASPIRIN 81 MG PO CHEW
CHEWABLE_TABLET | ORAL | Status: DC | PRN
  Administered 2014-12-08: 243 mg via ORAL

## 2014-12-08 MED ORDER — NITROGLYCERIN 5 MG/ML IV SOLN
INTRAVENOUS | Status: AC
  Filled 2014-12-08: qty 10

## 2014-12-08 SURGICAL SUPPLY — 16 items
BALLN TREK RX 2.5X15 (BALLOONS) ×4
BALLN ~~LOC~~ TREK RX 2.5X12 (BALLOONS) ×4
BALLOON TREK RX 2.5X15 (BALLOONS) ×2 IMPLANT
BALLOON ~~LOC~~ TREK RX 2.5X12 (BALLOONS) ×2 IMPLANT
CATH INFINITI 5FR ANG PIGTAIL (CATHETERS) ×4 IMPLANT
CATH OPTITORQUE JACKY 4.0 5F (CATHETERS) ×4 IMPLANT
CATH VISTA GUIDE 6FR JR4 (CATHETERS) ×4 IMPLANT
DEVICE INFLAT 30 PLUS (MISCELLANEOUS) ×4 IMPLANT
DEVICE RAD COMP TR BAND LRG (VASCULAR PRODUCTS) ×4 IMPLANT
GLIDESHEATH SLEND SS 6F .021 (SHEATH) ×4 IMPLANT
KIT MANI 3VAL PERCEP (MISCELLANEOUS) ×4 IMPLANT
PACK CARDIAC CATH (CUSTOM PROCEDURE TRAY) ×4 IMPLANT
STENT XIENCE ALPINE RX 2.25X18 (Permanent Stent) ×4 IMPLANT
WIRE HITORQ VERSACORE ST 145CM (WIRE) ×4 IMPLANT
WIRE RUNTHROUGH .014X180CM (WIRE) ×4 IMPLANT
WIRE SAFE-T 1.5MM-J .035X260CM (WIRE) ×4 IMPLANT

## 2014-12-08 NOTE — Consult Note (Signed)
Cardiology Consultation Note  Patient ID: Jacob Ford, MRN: [id number], DOB/AGE: [birth year] 74 y.o. Admit date: 12/07/2014   Date of Consult: 12/08/2014 Primary Physician: Derwood Kaplan, MD Primary Cardiologist: New to [facility]  Chief Complaint: Chest pain Reason for Consult: NSTEMI  HPI: 74 y.o. male with h/o HTN, COPD, and ongoing tobacco abuse who presented to [facility] ED on 11/25 with right-sided chest pain and went home on his own accord returned to the ED on 11/27 with a NSTEMI with a peak troponin of 5.28. Cardiology is consulted for further evaluation.   He was last seen by cardiology in 2002 during an admission for chest pain. He underwent cardiac cath at that time, and per his report there was no obstructive disease at that time time. He has continued to smoke 1 pack of cigarettes daily for the past 60 years. He previously drank ETOh heavily. He denies any illegal drug abuse. He has two brothers that have previously undergone cardiac bypass surgery.    Patient was recently seen in the [facility] ED on 11/25 with right-sided chest pain occuring at rest in the setting of accelerated HTN. Troponin of 0.04-->0.05. ECG from 11/25 showed NSR, 76 bpm, nonspecific inferior st/t changes, lateral TWI, CXR non-acute. CTA chest from 11/25 showed multiple nodules and cysts with repeat imaging recommended. No comment on PE or not. His chest pain improved and he wished to go home with outpatient follow up. He remained chest pain free until 11/27 when his pain returned, though worse than before. Again, the pain occurred at rest. There were no associated symptoms, including nausea, vomiting, diaphoresis, palpitations, presyncope, or syncope. He has not experienced any lower extremity edema, increased cough, orthopnea, PND, or early satiety.   Upon the patient's arrival to [facility] they were found to have troponin of 2.05-->2.37-->5.28. ECG from 11/27 showed NSR, 81 bpm, rare PVC, 1-2 mm ST elevation III, lateral TWI  (old dates back to 2002), CXR showed no acute pathology. He was started on a heparin gtt and scheduled for cardiac catheterization. He is currently without chest pain.      Past Medical History  Diagnosis Date  . COPD (chronic obstructive pulmonary disease) (HCC)   . Hypertension   . Tobacco abuse   . History of cardiac cath     a. 2002 - nonobstructive disease per patient      Most Recent Cardiac Studies: None   Surgical History:  Past Surgical History  Procedure Laterality Date  . Shoulder surgery Left   . Cardiac catheterization       Home Meds: Prior to Admission medications   Medication Sig Start Date End Date Taking? Authorizing Provider  acetaminophen (TYLENOL) 500 MG tablet Take 1,000 mg by mouth every 6 (six) hours as needed for mild pain.   Yes Historical Provider, MD  Fluticasone Furoate-Vilanterol (BREO ELLIPTA) 100-25 MCG/INH AEPB Inhale 1 puff into the lungs daily.    Yes Historical Provider, MD  lisinopril-hydrochlorothiazide (PRINZIDE,ZESTORETIC) 20-25 MG tablet Take 1 tablet by mouth daily.   Yes Historical Provider, MD  meloxicam (MOBIC) 15 MG tablet Take 15 mg by mouth daily.   Yes Historical Provider, MD  metoprolol (LOPRESSOR) 50 MG tablet Take 50 mg by mouth 2 (two) times daily.   Yes Historical Provider, MD    Inpatient Medications:  . aspirin EC  81 mg Oral Daily  . atorvastatin  20 mg Oral q1800  . lisinopril  20 mg Oral Daily   And  . hydrochlorothiazide  25 mg Oral Daily  . metoprolol  50 mg Oral BID  . mometasone-formoterol  2 puff Inhalation BID  . nicotine  21 mg Transdermal Daily  . nitroGLYCERIN  1 inch Topical 4 times per day  . sodium chloride  3 mL Intravenous Q12H   . sodium chloride 75 mL/hr at 12/07/14 2116  . sodium chloride 1 mL/kg/hr (12/08/14 0716)  . heparin 1,300 Units/hr (12/07/14 2108)    Allergies: No Known Allergies  Social History   Social History  . Marital Status: Single    Spouse Name: N/A  . Number of  Children: N/A  . Years of Education: N/A   Occupational History  . Not on file.   Social History Main Topics  . Smoking status: Current Every Day Smoker    Types: Cigarettes  . Smokeless tobacco: Not on file  . Alcohol Use: No  . Drug Use: Not on file  . Sexual Activity: Not on file   Other Topics Concern  . Not on file   Social History Narrative     Family History  Problem Relation Age of Onset  . CAD Brother   . CAD Brother   . Stroke Sister   . Stroke Sister   . Hypertension Mother   . Hypertension Father      Review of Systems: Review of Systems  Constitutional: Positive for malaise/fatigue. Negative for fever, chills, weight loss and diaphoresis.  HENT: Negative for congestion.   Eyes: Negative for discharge and redness.  Respiratory: Negative for cough, hemoptysis, sputum production, shortness of breath and wheezing.   Cardiovascular: Positive for chest pain. Negative for palpitations, orthopnea, claudication, leg swelling and PND.       Right-sided chest pain  Gastrointestinal: Negative for heartburn, nausea, vomiting and abdominal pain.  Musculoskeletal: Negative for myalgias, back pain, joint pain, falls and neck pain.  Skin: Negative for rash.  Neurological: Positive for weakness. Negative for dizziness, tingling, tremors, sensory change, speech change, focal weakness, loss of consciousness and headaches.  Endo/Heme/Allergies: Does not bruise/bleed easily.  Psychiatric/Behavioral: Positive for substance abuse. The patient is not nervous/anxious.        Ongoing tobacco abuse  All other systems reviewed and are negative.   Labs:  Recent Labs  12/05/14 1834 12/07/14 1807 12/07/14 2031 12/08/14 0337  TROPONINI 0.05* 2.05* 2.37* 5.28*   Lab Results  Component Value Date   WBC 10.7* 12/08/2014   HGB 13.7 12/08/2014   HCT 43.1 12/08/2014   MCV 86.8 12/08/2014   PLT 174 12/08/2014     Recent Labs Lab 12/08/14 0337  NA 134*  K 3.5  CL 100*    CO2 27  BUN 9  CREATININE 0.81  CALCIUM 8.6*  GLUCOSE 139*   Lab Results  Component Value Date   CHOL 178 12/08/2014   HDL 39* 12/08/2014   LDLCALC 129* 12/08/2014   TRIG 51 12/08/2014   No results found for: DDIMER  Radiology/Studies:  Dg Chest 2 View  12/05/2014  CLINICAL DATA:  Right-sided chest pain since last night. EXAM: CHEST  2 VIEW COMPARISON:  12/11/2012 FINDINGS: Lungs are adequately inflated without consolidation or effusion. Cardiomediastinal silhouette is within normal. There is minimal calcified plaque over the aortic arch. Mild degenerate change of the spine. IMPRESSION: No active cardiopulmonary disease. Electronically Signed   By: Elberta Fortisaniel  Boyle M.D.   On: 12/05/2014 14:45   Ct Angio Chest Pe W/cm &/or Wo Cm  12/05/2014  CLINICAL DATA:  Shortness of breath with stabbing  chest pain since last night. EXAM: CT ANGIOGRAPHY CHEST WITH CONTRAST TECHNIQUE: Multidetector CT imaging of the chest was performed using the standard protocol during bolus administration of intravenous contrast. Multiplanar CT image reconstructions and MIPs were obtained to evaluate the vascular anatomy. CONTRAST:  OMNIPAQUE IOHEXOL 350 MG/ML SOLN COMPARISON:  Chest x-ray today and 12/13/2012 FINDINGS: Lungs are well inflated and demonstrate no focal consolidation or effusion. There is linear scarring/ atelectasis over the right lower lobe. 3 mm nodule over the right upper lobe. 3-4 mm nodule over the left upper lobe. Subtle bronchial wall thickening over the lower lobes. There is mild cardiomegaly. Sub cm right pericardial phrenic lymph node is present. No evidence of pulmonary embolism. Mild calcified plaque over the left lateral circumflex coronary artery. Mild calcified plaque over the thoracic aorta. No significant hilar or mediastinal adenopathy. Mildly prominent left axillary lymph node measuring 1.5 cm by short axis. Possible minimal bilateral gynecomastia right worse than left. Images through  the upper abdomen demonstrate a sub cm hypodensity over the left lobe of the liver likely a cyst. There is also a 1.7 cm hypodensity partially visualized over the right lobe of the liver likely a cyst. 1.8 cm hypodensity over the upper pole right kidney with Hounsfield unit measurements 22. 2.5 cm right adrenal mass with Hounsfield unit measurements 26 likely an adenoma. There mild degenerate changes of the spine. Review of the MIP images confirms the above findings. IMPRESSION: Minimal bronchial wall thickening over the lower lobes which may be due to an acute bronchitic process. Linear atelectasis/ scarring right lower lobe. Couple tiny pulmonary nodules as described with the larger over the left upper lobe measuring 3-4 mm. Recommend follow-up noncontrast chest CT 1 year. This recommendation follows the consensus statement: Guidelines for Management of Small Pulmonary Nodules Detected on CT Scans: A Statement from the Fleischner Society as published in Radiology 2005; 237:395-400. Online at: DietDisorder.cz. 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 Couple liver hypodensities likely cysts. 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 1.8 cm hypodensity over the upper pole right renal cortex likely a cyst, but indeterminate. Recommend followup CT 6 months pre and post-contrast. Electronically Signed   By: Elberta Fortis M.D.   On: 12/05/2014 17:17   Dg Chest Port 1 View  12/07/2014  CLINICAL DATA:  Right-sided chest pain for 2 days. Shortness breath. EXAM: PORTABLE CHEST - 1 VIEW COMPARISON:  CT the chest 12/05/2014. FINDINGS: The heart size is upper limits of normal. There is no edema or effusion to suggest failure. No focal airspace consolidation is present. The visualized soft tissues and bony thorax are unremarkable. Defibrillator pads are in place. IMPRESSION: No acute abnormality or significant interval change.  Electronically Signed   By: Marin Roberts M.D.   On: 12/07/2014 19:38    EKG: NSR, 81 bpm, rare PVC, 1-2 mm ST elevation III, lateral TWI (old dates back to 2002)  Weights: Filed Weights   12/07/14 1806 12/07/14 2152  Weight: 230 lb (104.327 kg) 220 lb 3.2 oz (99.882 kg)     Physical Exam: Blood pressure 139/95, pulse 75, temperature 98.7 F (37.1 C), temperature source Oral, resp. rate 22, height 6' 2.5" (1.892 m), weight 220 lb 3.2 oz (99.882 kg), SpO2 93 %. Body mass index is 27.9 kg/(m^2). General: Well developed, well nourished, in no acute distress. Head: Normocephalic, atraumatic, sclera non-icteric, no xanthomas, nares are without discharge.  Neck: Negative for carotid bruits. JVD not elevated. Lungs:  Clear bilaterally to auscultation without wheezes, rales, or rhonchi. Breathing is unlabored. Heart: RRR with S1 S2. No murmurs, rubs, or gallops appreciated. Abdomen: Soft, non-tender, non-distended with normoactive bowel sounds. No hepatomegaly. No rebound/guarding. No obvious abdominal masses. Msk:  Strength and tone appear normal for age. Extremities: No clubbing or cyanosis. No edema.  Distal pedal pulses are 2+ and equal bilaterally. Neuro: Alert and oriented X 3. No facial asymmetry. No focal deficit. Moves all extremities spontaneously. Psych:  Responds to questions appropriately with a normal affect.    Assessment and Plan:   1. NSTEMI: -Cardiac catheterization today -Heparin gtt -Aspirin  -Lopressor 50 mg bid -Nitro paste -Check A1C -Risks and benefits of cardiac catheterization have been discussed with the patient including risks of bleeding, bruising, infection, kidney damage, stroke, heart attack, and death. The patient understands these risks and is willing to proceed with the procedure. All questions have been answered and concerns listened to.   2. Accelerated HTN: -Improving -Lopressor 50 mg, lisinopril 20 mg (held for cath), HCTZ 25 mg (held for  cath) -Consider adding Norvasc  3. HLD: -Lipitor 20 mg  4. Ongoing tobacco abuse: -Cessation is advised  COPD: -No acute exacerbation -Per IM   Signed, Eula Listen, PA-C Pager: [phone] 12/08/2014, 8:17 AM

## 2014-12-08 NOTE — Progress Notes (Signed)
To cath lab via bed.

## 2014-12-08 NOTE — Progress Notes (Signed)
Pt returned from cath lab s/p drug eluding stent to RCA.  A&O x3, no distress on ra.  Rt radial cath site dressing dry and intact, up on pillow.  Pt instructed on limited use of rt arm for next 24 hours.  Cardiac monitor in place, pt denies chest pain.  Lungs with scattered rhonci, diminished lower lobes bil. Sons at bedside.  Denies need. CB in reach, SR up x 3.

## 2014-12-08 NOTE — Progress Notes (Signed)
[facility]   PATIENT NAME: Jacob Ford    MR#:  [id number]  DATE OF BIRTH:  08/04/1940  SUBJECTIVE:  No cp at present. Sob earlier  REVIEW OF SYSTEMS:   Review of Systems  Constitutional: Negative for fever, chills and weight loss.  HENT: Negative for ear discharge, ear pain and nosebleeds.   Eyes: Negative for blurred vision, pain and discharge.  Respiratory: Negative for sputum production, shortness of breath, wheezing and stridor.   Cardiovascular: Negative for chest pain, palpitations, orthopnea and PND.  Gastrointestinal: Negative for nausea, vomiting, abdominal pain and diarrhea.  Genitourinary: Negative for urgency and frequency.  Musculoskeletal: Negative for back pain and joint pain.  Neurological: Negative for sensory change, speech change, focal weakness and weakness.  Psychiatric/Behavioral: Negative for depression and hallucinations. The patient is not nervous/anxious.    Tolerating Diet:yes Tolerating PT: not needed  DRUG ALLERGIES:  No Known Allergies  VITALS:  Blood pressure 155/87, pulse 78, temperature 98.5 F (36.9 C), temperature source Oral, resp. rate 20, height 6' 2.5" (1.892 m), weight 220 lb 3.2 oz (99.882 kg), SpO2 95 %.  PHYSICAL EXAMINATION:   Physical Exam  GENERAL:  74 y.o.-year-old patient lying in the bed with no acute distress. Morbid obesity EYES: Pupils equal, round, reactive to light and accommodation. No scleral icterus. Extraocular muscles intact.  HEENT: Head atraumatic, normocephalic. Oropharynx and nasopharynx clear.  NECK:  Supple, no jugular venous distention. No thyroid enlargement, no tenderness.  LUNGS: Normal breath sounds bilaterally, no wheezing, rales, rhonchi. No use of accessory muscles of respiration.  CARDIOVASCULAR: S1, S2 normal. No murmurs, rubs, or gallops.  ABDOMEN: Soft, nontender, nondistended. Bowel sounds present. No organomegaly or mass.  EXTREMITIES: No  cyanosis, clubbing or edema b/l.    NEUROLOGIC: Cranial nerves II through XII are intact. No focal Motor or sensory deficits b/l.   PSYCHIATRIC: The patient is alert and oriented x 3.  SKIN: No obvious rash, lesion, or ulcer.    LABORATORY PANEL:   CBC  Recent Labs Lab 12/08/14 0337  WBC 10.7*  HGB 13.7  HCT 43.1  PLT 174    Chemistries   Recent Labs Lab 12/08/14 0337  NA 134*  K 3.5  CL 100*  CO2 27  GLUCOSE 139*  BUN 9  CREATININE 0.81  CALCIUM 8.6*    Cardiac Enzymes  Recent Labs Lab 12/08/14 0337  TROPONINI 5.28*    RADIOLOGY:  Dg Chest Port 1 View  12/07/2014  CLINICAL DATA:  Right-sided chest pain for 2 days. Shortness breath. EXAM: PORTABLE CHEST - 1 VIEW COMPARISON:  CT the chest 12/05/2014. FINDINGS: The heart size is upper limits of normal. There is no edema or effusion to suggest failure. No focal airspace consolidation is present. The visualized soft tissues and bony thorax are unremarkable. Defibrillator pads are in place. IMPRESSION: No acute abnormality or significant interval change. Electronically Signed   By: Marin Robertshristopher  Mattern M.D.   On: 12/07/2014 19:38     ASSESSMENT AND PLAN:   74 year old male with a history of essential hypertension and tobacco abuse who presents with chest pain.  1. Non-ST elevation MI: The patient has currently ruled in for non-ST elevation MI.  -s/p cath with DES in 100% RCA -cont BB,asa,lisinopril/hctz,nitro prn, lipitor  2. Tobacco dependence: Patient does not want to quit smoking at this time. However, he is highly encouraged to do so. Patient is counseled for 3 minutes.  3. Essential hypertension: Patient will continue  his outpatient medications.  4. Obesity: Patient is counseled for diet and exercise.   Case discussed with Care Management/Social Worker. Management plans discussed with the patient, family and they are in agreement.  CODE STATUS:Full  DVT Prophylaxis: lovenox  TOTAL TIME TAKING CARE  OF THIS PATIENT: 30 minutes.  >50% time spent on counselling and coordination of care  POSSIBLE D/C IN 1 DAYS, DEPENDING ON CLINICAL CONDITION.   Zierra Laroque M.D on 12/08/2014 at 2:10 PM  Between 7am to 6pm - Pager - [phone]  After 6pm go to [email or web address] - password [email or web address] [facility]  [place]  Office  [phone]  CC: Primary care physician; Derwood Kaplan, MD

## 2014-12-08 NOTE — Plan of Care (Signed)
Problem: Safety: Goal: Ability to remain free from injury will improve Outcome: Progressing Fall precautions in place  Problem: Cardiac: Goal: Vascular access site(s) Level 0-1 will be maintained Outcome: Progressing Cath today  Problem: Education: Goal: Understanding of cardiac disease, CV risk reduction, and recovery process will improve Outcome: Progressing Cath today

## 2014-12-08 NOTE — OR Nursing (Signed)
[facility] Dr Kirke CorinArida aware of pt's lung congestion.

## 2014-12-08 NOTE — OR Nursing (Signed)
Patient c/o SOB , lungs with crackles in the bases,  sats 98% on 2 liters [name], Dr Kirke CorinArida paged, and SVN ordered, also [name] to bring scheduled AM medications

## 2014-12-08 NOTE — Progress Notes (Signed)
ANTICOAGULATION CONSULT NOTE - Initial Consult  Pharmacy Consult for heparin Indication: chest pain/ACS  No Known Allergies  Patient Measurements: Height: 6' 2.5" (189.2 cm) Weight: 220 lb 3.2 oz (99.882 kg) IBW/kg (Calculated) : 83.35 Heparin Dosing Weight: 99.9 kg  Vital Signs: Temp: 97.8 F (36.6 C) (11/28 0553) Temp Source: Oral (11/28 0553) BP: 133/70 mmHg (11/28 0553) Pulse Rate: 74 (11/28 0553)  Labs:  Recent Labs  12/05/14 1504  12/07/14 1807 12/07/14 2031 12/08/14 0337 12/08/14 0447  HGB 15.2  --  15.8  --  13.7  --   HCT 47.5  --  49.2  --  43.1  --   PLT 198  --  200  --  174  --   APTT  --   --  33  --   --   --   LABPROT  --   --  14.1  --   --   --   INR  --   --  1.07  --   --   --   HEPARINUNFRC  --   --   --   --   --  0.40  CREATININE 0.98  --  0.90  --  0.81  --   TROPONINI 0.04*  < > 2.05* 2.37* 5.28*  --   < > = values in this interval not displayed.  Estimated Creatinine Clearance: 94.4 mL/min (by C-G formula based on Cr of 0.81).   Medical History: Past Medical History  Diagnosis Date  . COPD (chronic obstructive pulmonary disease) (HCC)   . Hypertension     Medications:  Infusions:  . sodium chloride 75 mL/hr at 12/07/14 2116  . sodium chloride 3 mL/kg/hr (12/08/14 16100605)   Followed by  . sodium chloride    . heparin 1,300 Units/hr (12/07/14 2108)    Assessment: 74 yom cc chest pain, substernal with radiating to right arm. Troponin > 2 x 1 will serial. Plan for cath tomorrow. Starting UFH.   Goal of Therapy:  Heparin level 0.3-0.7 units/ml Monitor platelets by anticoagulation protocol: Yes   Plan:  1128 0447 heparin level therapeutic, continue current rate and will order confirmatory level in 8 hours.  Carola FrostNathan A Yazir Koerber, Pharm.D., BCPS Clinical Pharmacist 12/08/2014,6:31 AM

## 2014-12-09 ENCOUNTER — Other Ambulatory Visit: Payer: Self-pay | Admitting: Cardiovascular Disease

## 2014-12-09 ENCOUNTER — Telehealth: Payer: Self-pay

## 2014-12-09 DIAGNOSIS — I25111 Atherosclerotic heart disease of native coronary artery with angina pectoris with documented spasm: Secondary | ICD-10-CM | POA: Insufficient documentation

## 2014-12-09 DIAGNOSIS — Z720 Tobacco use: Secondary | ICD-10-CM

## 2014-12-09 DIAGNOSIS — I209 Angina pectoris, unspecified: Secondary | ICD-10-CM

## 2014-12-09 DIAGNOSIS — F17200 Nicotine dependence, unspecified, uncomplicated: Secondary | ICD-10-CM | POA: Insufficient documentation

## 2014-12-09 LAB — BASIC METABOLIC PANEL
Anion gap: 10 (ref 5–15)
BUN: 10 mg/dL (ref 6–20)
CHLORIDE: 97 mmol/L — AB (ref 101–111)
CO2: 29 mmol/L (ref 22–32)
Calcium: 8.8 mg/dL — ABNORMAL LOW (ref 8.9–10.3)
Creatinine, Ser: 0.81 mg/dL (ref 0.61–1.24)
GFR calc non Af Amer: >60 mL/min (ref >60)
Glucose, Bld: 121 mg/dL — ABNORMAL HIGH (ref 65–99)
POTASSIUM: 3 mmol/L — AB (ref 3.5–5.1)
SODIUM: 136 mmol/L (ref 135–145)

## 2014-12-09 LAB — CBC
HEMATOCRIT: 40.5 % (ref 40.0–52.0)
Hemoglobin: 13.2 g/dL (ref 13.0–18.0)
MCH: 28.1 pg (ref 26.0–34.0)
MCHC: 32.6 g/dL (ref 32.0–36.0)
MCV: 86.3 fL (ref 80.0–100.0)
Platelets: 162 10*3/uL (ref 150–440)
RBC: 4.69 MIL/uL (ref 4.40–5.90)
RDW: 15.2 % — ABNORMAL HIGH (ref 11.5–14.5)
WBC: 10.1 10*3/uL (ref 3.8–10.6)

## 2014-12-09 LAB — TROPONIN I
Troponin I: 12.08 ng/mL — ABNORMAL HIGH (ref <0.031)
Troponin I: 8.51 ng/mL — ABNORMAL HIGH (ref <0.031)

## 2014-12-09 MED ORDER — TICAGRELOR 90 MG PO TABS
90.0000 mg | ORAL_TABLET | Freq: Two times a day (BID) | ORAL | Status: DC
Start: 2014-12-09 — End: 2015-04-13

## 2014-12-09 MED ORDER — ATORVASTATIN CALCIUM 80 MG PO TABS: 80.0000 mg | ORAL_TABLET | Freq: Every day | ORAL | Status: AC

## 2014-12-09 MED ORDER — ASPIRIN 81 MG PO TBEC: 81.0000 mg | DELAYED_RELEASE_TABLET | Freq: Every day | ORAL | Status: AC

## 2014-12-09 MED ORDER — HYDROCODONE-ACETAMINOPHEN 5-325 MG PO TABS
1.0000 | ORAL_TABLET | ORAL | Status: DC | PRN
Start: 2014-12-09 — End: 2015-07-03

## 2014-12-09 MED ORDER — IPRATROPIUM-ALBUTEROL 0.5-2.5 (3) MG/3ML IN SOLN
3.0000 mL | Freq: Four times a day (QID) | RESPIRATORY_TRACT | Status: DC | PRN
  Administered 2014-12-09: 3 mL via RESPIRATORY_TRACT
  Filled 2014-12-09: qty 3

## 2014-12-09 MED ORDER — POTASSIUM CHLORIDE CRYS ER 20 MEQ PO TBCR
40.0000 meq | EXTENDED_RELEASE_TABLET | ORAL | Status: AC
  Administered 2014-12-09 (×2): 40 meq via ORAL
  Filled 2014-12-09 (×2): qty 2

## 2014-12-09 MED ORDER — NITROGLYCERIN 0.4 MG SL SUBL: 0.4000 mg | SUBLINGUAL_TABLET | SUBLINGUAL | Status: DC | PRN

## 2014-12-09 MED ORDER — POTASSIUM CHLORIDE CRYS ER 20 MEQ PO TBCR
40.0000 meq | EXTENDED_RELEASE_TABLET | Freq: Once | ORAL | Status: AC
  Administered 2014-12-09: 40 meq via ORAL
  Filled 2014-12-09: qty 2

## 2014-12-09 NOTE — Progress Notes (Signed)
RN made aware of HR dropping to 39, then returning to 80s. Pt asymptomatic. RN will continue to monitor. Syliva Overmanassie A Caffie Sotto, RN

## 2014-12-09 NOTE — Progress Notes (Addendum)
Dr. Mariah MillingGollan paged and told about 1530 troponin, Dr. Allena KatzPatel also made aware. Per Dr. Mariah MillingGollan, OK to d/c home if Dr. Allena KatzPatel can send him with something for pain. Cardiology feels this is atypical chest pain and more musculoskeletal than cardiac. Dr. Mariah MillingGollan reports he will send new prescription for SL nitro to patients pharmacy. Dr. Allena KatzPatel to put in appropriate orders. Patient states he feels comfortable going home; pt is aware he will have pain medication and nitro. Education provided on how and when to take each of these medications. Per Dr. Mariah MillingGollan, if pt. experiences more chest pain, he should call the cardiologist first.

## 2014-12-09 NOTE — Progress Notes (Signed)
Patient: Jacob Ford / Admit Date: 12/07/2014 / Date of Encounter: 12/09/2014, 8:23 AM   Subjective: Reports that he woke up this morning with chest discomfort, constant, no reproducible pain on palpation, pain radiates left to right across his mediastinum Different from his pain on admission which was in his right shoulder area, right upper chest -Denies shortness of breath, no diaphoresis, no nausea Otherwise feels at his baseline apart from 6/10 discomfort Unable to reposition in bed to make his symptoms go away  Review of Systems: Review of Systems  Constitutional: Negative.   Respiratory: Negative.   Cardiovascular: Positive for chest pain.  Gastrointestinal: Negative.   Musculoskeletal: Negative.   Neurological: Negative.   Psychiatric/Behavioral: Negative.   All other systems reviewed and are negative.   Objective: Telemetry: nsr with pauses overnight consistent with sleep apnea Physical Exam: Blood pressure 153/91, pulse 79, temperature 98.2 F (36.8 C), temperature source Oral, resp. rate 12, height 6' 2.5" (1.892 m), weight 220 lb 3.2 oz (99.882 kg), SpO2 92 %. Body mass index is 27.9 kg/(m^2). General: Well developed, well nourished, in no acute distress. Head: Normocephalic, atraumatic, sclera non-icteric, no xanthomas, nares are without discharge. Neck: Negative for carotid bruits. JVP not elevated. Lungs: Moderately decreased breath sounds throughout, otherwise clear bilaterally to auscultation without wheezes, rales, or rhonchi. Breathing is unlabored. Heart: RRR S1 S2 without murmurs, rubs, or gallops.  Abdomen: Soft, non-tender, non-distended with normoactive bowel sounds. No rebound/guarding. Extremities: No clubbing or cyanosis. No edema. Decreased distal pulses Neuro: Alert and oriented X 3. Moves all extremities spontaneously. Psych:  Responds to questions appropriately with a normal affect.   Intake/Output Summary (Last 24 hours) at 12/09/14  0823 Last data filed at 12/09/14 0346  Gross per 24 hour  Intake 1356.33 ml  Output   1000 ml  Net 356.33 ml    Inpatient Medications:  . aspirin EC  81 mg Oral Daily  . atorvastatin  80 mg Oral q1800  . lisinopril  20 mg Oral Daily   And  . hydrochlorothiazide  25 mg Oral Daily  . metoprolol  50 mg Oral BID  . mometasone-formoterol  2 puff Inhalation BID  . nicotine  21 mg Transdermal Daily  . sodium chloride  3 mL Intravenous Q12H  . ticagrelor  90 mg Oral BID   Infusions:    Labs:  Recent Labs  12/08/14 0337 12/09/14 0426  NA 134* 136  K 3.5 3.0*  CL 100* 97*  CO2 27 29  GLUCOSE 139* 121*  BUN 9 10  CREATININE 0.81 0.81  CALCIUM 8.6* 8.8*   No results for input(s): AST, ALT, ALKPHOS, BILITOT, PROT, ALBUMIN in the last 72 hours.  Recent Labs  12/07/14 1807 12/08/14 0337 12/09/14 0426  WBC 10.1 10.7* 10.1  NEUTROABS 7.7*  --   --   HGB 15.8 13.7 13.2  HCT 49.2 43.1 40.5  MCV 88.2 86.8 86.3  PLT 200 174 162    Recent Labs  12/07/14 1807 12/07/14 2031 12/08/14 0337  TROPONINI 2.05* 2.37* 5.28*   Invalid input(s): POCBNP  Recent Labs  12/08/14 0337  HGBA1C 6.6*     Weights: Filed Weights   12/07/14 1806 12/07/14 2152  Weight: 230 lb (104.327 kg) 220 lb 3.2 oz (99.882 kg)     Radiology/Studies:  Dg Chest 2 View  12/05/2014  . IMPRESSION: No active cardiopulmonary disease. Electronically Signed   By: Elberta Fortisaniel  Boyle M.D.   On: 12/05/2014 14:45   Ct Angio  Chest Pe W/cm &/or Wo Cm  12/05/2014   IMPRESSION: Minimal bronchial wall thickening over the lower lobes which may be due to an acute bronchitic process. Linear atelectasis/ scarring right lower lobe. Couple tiny pulmonary nodules as described with the larger over the left upper lobe measuring 3-4 mm. Recommend follow-up noncontrast chest CT 1 year. This recommendation follows the consensus statement: Guidelines for Management of Small Pulmonary Nodules Detected on CT Scans: A Statement from  the Fleischner Society as published in Radiology 2005; 237:395-400. Online at: DietDisorder.cz. 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 Couple liver hypodensities likely cysts. 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 1.8 cm hypodensity over the upper pole right renal cortex likely a cyst, but indeterminate. Recommend followup CT 6 months pre and post-contrast. Electronically Signed   By: Elberta Fortis M.D.   On: 12/05/2014 17:17   Dg Chest Port 1 View  12/07/2014 . IMPRESSION: No acute abnormality or significant interval change. Electronically Signed   By: Marin Roberts M.D.   On: 12/07/2014 19:38     Assessment and Plan   1. NSTEMI: [place] CAD on CAth yesterday, stent to the distal RCA, small vessel Chest pain this AM --EKG with T wave ABN inf and anterolateral this AM Troponin pending but will be difficult to interpret as intervention on the RCA was yesterday --Case discussed with Rachel Moulds,  he does report significant diffuse disease beyond where the stent was placed also three-vessel disease  ----Nitroglycerin this morning poor symptom relief, will monitor closely --No plan at this time for repeat catheterization We'll also replete potassium, unable to exclude muscle spasms as a cause of his chest pain though no tenderness on palpation Plan discussed with patient  2. Accelerated HTN: -Lopressor 50 mg, lisinopril 20 mg , HCTZ 25 mg  Consider adding  Norvasc  3. HLD: -Lipitor 20 mg  4. Ongoing tobacco abuse: -Cessation is advised  COPD: -No acute exacerbation -Per IM  Signed, Dossie Arbour, MD [facility] 12/09/2014, 8:23 AM

## 2014-12-09 NOTE — Telephone Encounter (Signed)
Attempted to contact pt regarding discharge from [facility] on 12/09/14. Left message asking pt to call back w/ any questions or concerns regarding discharge instructions and/or medications. Advised him of appt w/ Ward Givenshris Berge, NP on 01/15/15 @ 10:30/ Asked him to call back if he will be unable to keep this appt.

## 2014-12-09 NOTE — Progress Notes (Signed)
Pt. Discharged to home via wc. Discharge instructions and medication regimen reviewed at bedside with patient. Pt. verbalizes understanding of instructions and medication regimen. Prescriptions sent to pharmacy except vicodin which was included with discharge papers. Education provided both verbally and via handouts on nitro, indications on when to take it and how and how it is different than vicodin. Pt verbalizes understanding of this. Patient assessment unchanged from this morning. TELE and IV discontinued per policy.

## 2014-12-09 NOTE — Progress Notes (Signed)
Dr Mariah MillingGollan ok for pt to go home. The elevated troponin is due to recent cath. Pt to get some Norco to help with his cp which some could be musculoskeletal. F/u with cardiology as out pt

## 2014-12-09 NOTE — Progress Notes (Signed)
Dr. Mariah MillingGollan notified of increased troponin. MD wants to check troponin again at 1530 and said to give nitro again and duonebs if needed. Will continue to monitor.

## 2014-12-09 NOTE — Care Management (Signed)
Patient admitted from home with NSTEMI.  Patient lives at home alone.  Patient is independent, and drives.  [facility] on [address]  Patient states that he has multiple adult children locally for support.  Patient does not have any home medical equipment.  Patient to be discharged  On Brilinta.  I checked with his pharmacy and his copay is $45 for 1 month.  Patient states that he will be able to afford this at time of discharge.  Co-pay/30 free card given to patient.

## 2014-12-09 NOTE — Care Management Important Message (Signed)
Important Message  Patient Details  Name: Jacob Ford MRN: [id number] Date of Birth: 1940/03/08   Medicare Important Message Given:  Yes    Olegario MessierKathy A Cristalle Rohm 12/09/2014, 9:13 AM

## 2014-12-09 NOTE — Progress Notes (Signed)
Called by nursing that patient is having 3-5 second episodes of bradycardia to 30s.  Pt is asymptomatic and remains hemodynamically stable.  Will continue to monitor at this time.  Cardiology following.  Kristeen MissWILLIS, Philbert Ocallaghan FIELDING [facility] 12/09/2014, 1:57 AM

## 2014-12-09 NOTE — Progress Notes (Addendum)
RN made aware of pt HR dropping as low as 28. Pt asymptomatic, HR returned to 80s when pt is woken up. Third occurrence of bradycardia this shift, previous occurences dropped to 30s-40s.MD made aware, no orders received. Strip placed in chart. Will continue to monitor. Syliva Overmanassie A Joanell Cressler, RN

## 2014-12-09 NOTE — Telephone Encounter (Signed)
-----   Message from Christell Faithraci L Tekely sent at 12/09/2014  4:36 PM EST ----- Regarding: tcm/ph 01/15/15 @ 10:30 C. Brion AlimentBerge, NP

## 2014-12-09 NOTE — Discharge Instructions (Signed)
STOP SMOKING!!!!!! 

## 2014-12-09 NOTE — Progress Notes (Signed)
MD rounding on patient this AM, RN asked about giving patient scheduled metoprolol due to short episodes of bradycardia over night. Per Dr. Allena KatzPatel, OK to give this AM.

## 2014-12-09 NOTE — Discharge Summary (Signed)
[facility] [place] [facility]   PATIENT NAME: Jacob Ford    MR#:  [id number]  DATE OF BIRTH:  03-May-1940  DATE OF ADMISSION:  12/07/2014 ADMITTING PHYSICIAN: Adrian Saran, MD  DATE OF DISCHARGE: 12/09/14  PRIMARY CARE PHYSICIAN: Derwood Kaplan, MD    ADMISSION DIAGNOSIS:  Precordial pain [R07.2] NSTEMI (non-ST elevated myocardial infarction) (HCC) [I21.4]  DISCHARGE DIAGNOSIS:  Acute NSTEMI s/p cath with stent in distal RCA (100%)  SECONDARY DIAGNOSIS:   Past Medical History  Diagnosis Date  . COPD (chronic obstructive pulmonary disease) (HCC)   . Hypertension   . Tobacco abuse   . History of cardiac cath     a. 2002 - nonobstructive disease per patient    HOSPITAL COURSE:   74 year old male with a history of essential hypertension and tobacco abuse who presents with chest pain.  1. Non-ST elevation MI: The patient has currently ruled in for non-ST elevation MI.  -s/p cath with DES in 100% RCA -cont BB,asa,lisinopril/hctz,nitro prn, lipitor -transient bradycardia,asymptomatic last nite.  -d/w dr Mariah Milling. Ok to go home  2. Tobacco dependence: Patient does not want to quit smoking at this time. However, he is highly encouraged to do so. Patient is counseled for 3 minutes.  3. Essential hypertension: Patient will continue his outpatient medications.  4. Obesity: Patient is counseled for diet and exercise.  Overall stable. D/c home with outpt f/u with Dr Kirke Corin CONSULTS OBTAINED:  Treatment Team:  Antonieta Iba, MD  DRUG ALLERGIES:  No Known Allergies  DISCHARGE MEDICATIONS:   Current Discharge Medication List    START taking these medications   Details  aspirin EC 81 MG EC tablet Take 1 tablet (81 mg total) by mouth daily. Qty: 30 tablet, Refills: 2    atorvastatin (LIPITOR) 80 MG tablet Take 1 tablet (80 mg total) by mouth daily at 6 PM. Qty: 30 tablet, Refills: 2    ticagrelor (BRILINTA) 90 MG TABS tablet Take 1  tablet (90 mg total) by mouth 2 (two) times daily. Qty: 60 tablet, Refills: 3      CONTINUE these medications which have NOT CHANGED   Details  acetaminophen (TYLENOL) 500 MG tablet Take 1,000 mg by mouth every 6 (six) hours as needed for mild pain.    Fluticasone Furoate-Vilanterol (BREO ELLIPTA) 100-25 MCG/INH AEPB Inhale 1 puff into the lungs daily.     lisinopril-hydrochlorothiazide (PRINZIDE,ZESTORETIC) 20-25 MG tablet Take 1 tablet by mouth daily.    meloxicam (MOBIC) 15 MG tablet Take 15 mg by mouth daily.    metoprolol (LOPRESSOR) 50 MG tablet Take 50 mg by mouth 2 (two) times daily.        If you experience worsening of your admission symptoms, develop shortness of breath, life threatening emergency, suicidal or homicidal thoughts you must seek medical attention immediately by calling 911 or calling your MD immediately  if symptoms less severe.  You Must read complete instructions/literature along with all the possible adverse reactions/side effects for all the Medicines you take and that have been prescribed to you. Take any new Medicines after you have completely understood and accept all the possible adverse reactions/side effects.   Please note  You were cared for by a hospitalist during your hospital stay. If you have any questions about your discharge medications or the care you received while you were in the hospital after you are discharged, you can call the unit and asked to speak with the hospitalist on call if  the hospitalist that took care of you is not available. Once you are discharged, your primary care physician will handle any further medical issues. Please note that NO REFILLS for any discharge medications will be authorized once you are discharged, as it is imperative that you return to your primary care physician (or establish a relationship with a primary care physician if you do not have one) for your aftercare needs so that they can reassess your need for  medications and monitor your lab values. Today   SUBJECTIVE   Mild right upper cp  VITAL SIGNS:  Blood pressure 153/91, pulse 79, temperature 98.2 F (36.8 C), temperature source Oral, resp. rate 12, height 6' 2.5" (1.892 m), weight 220 lb 3.2 oz (99.882 kg), SpO2 92 %.  I/O:   Intake/Output Summary (Last 24 hours) at 12/09/14 0744 Last data filed at 12/09/14 0346  Gross per 24 hour  Intake 1356.33 ml  Output   1000 ml  Net 356.33 ml    PHYSICAL EXAMINATION:  GENERAL:  74 y.o.-year-old patient lying in the bed with no acute distress.  EYES: Pupils equal, round, reactive to light and accommodation. No scleral icterus. Extraocular muscles intact.  HEENT: Head atraumatic, normocephalic. Oropharynx and nasopharynx clear.  NECK:  Supple, no jugular venous distention. No thyroid enlargement, no tenderness.  LUNGS: Normal breath sounds bilaterally, no wheezing, rales,rhonchi or crepitation. No use of accessory muscles of respiration.  CARDIOVASCULAR: S1, S2 normal. No murmurs, rubs, or gallops.  ABDOMEN: Soft, non-tender, non-distended. Bowel sounds present. No organomegaly or mass.  EXTREMITIES: No pedal edema, cyanosis, or clubbing.  NEUROLOGIC: Cranial nerves II through XII are intact. Muscle strength 5/5 in all extremities. Sensation intact. Gait not checked.  PSYCHIATRIC: The patient is alert and oriented x 3.  SKIN: No obvious rash, lesion, or ulcer.   DATA REVIEW:   CBC   Recent Labs Lab 12/09/14 0426  WBC 10.1  HGB 13.2  HCT 40.5  PLT 162    Chemistries   Recent Labs Lab 12/09/14 0426  NA 136  K 3.0*  CL 97*  CO2 29  GLUCOSE 121*  BUN 10  CREATININE 0.81  CALCIUM 8.8*    Microbiology Results   No results found for this or any previous visit (from the past 240 hour(s)).  RADIOLOGY:  Dg Chest Port 1 View  12/07/2014  CLINICAL DATA:  Right-sided chest pain for 2 days. Shortness breath. EXAM: PORTABLE CHEST - 1 VIEW COMPARISON:  CT the chest  12/05/2014. FINDINGS: The heart size is upper limits of normal. There is no edema or effusion to suggest failure. No focal airspace consolidation is present. The visualized soft tissues and bony thorax are unremarkable. Defibrillator pads are in place. IMPRESSION: No acute abnormality or significant interval change. Electronically Signed   By: Marin Robertshristopher  Mattern M.D.   On: 12/07/2014 19:38     Management plans discussed with the patient, family and they are in agreement.  CODE STATUS:     Code Status Orders        Start     Ordered   12/08/14 1000  Full code   Continuous     12/08/14 1002    Advance Directive Documentation        Most Recent Value   Type of Advance Directive  Healthcare Power of Attorney   Pre-existing out of facility DNR order (yellow form or pink MOST form)     "MOST" Form in Place?        TOTAL  TIME TAKING CARE OF THIS PATIENT: 40 minutes.    Elizer Bostic M.D on 12/09/2014 at 7:44 AM  Between 7am to 6pm - Pager - [phone] After 6pm go to [email or web address] - password [email or web address] [facility]  [facility]  Office  [phone]  CC: Primary care physician; Derwood Kaplan, MD

## 2015-01-15 ENCOUNTER — Encounter: Payer: Self-pay | Admitting: Nurse Practitioner

## 2015-01-15 ENCOUNTER — Ambulatory Visit (INDEPENDENT_AMBULATORY_CARE_PROVIDER_SITE_OTHER): Payer: Medicare Other | Admitting: Nurse Practitioner

## 2015-01-15 ENCOUNTER — Encounter (INDEPENDENT_AMBULATORY_CARE_PROVIDER_SITE_OTHER): Payer: Self-pay

## 2015-01-15 VITALS — BP 110/84 | HR 71 | Ht 75.5 in | Wt 222.8 lb

## 2015-01-15 DIAGNOSIS — E785 Hyperlipidemia, unspecified: Secondary | ICD-10-CM

## 2015-01-15 DIAGNOSIS — Z720 Tobacco use: Secondary | ICD-10-CM

## 2015-01-15 DIAGNOSIS — I25111 Atherosclerotic heart disease of native coronary artery with angina pectoris with documented spasm: Secondary | ICD-10-CM | POA: Diagnosis not present

## 2015-01-15 DIAGNOSIS — I214 Non-ST elevation (NSTEMI) myocardial infarction: Secondary | ICD-10-CM | POA: Diagnosis not present

## 2015-01-15 DIAGNOSIS — I119 Hypertensive heart disease without heart failure: Secondary | ICD-10-CM | POA: Insufficient documentation

## 2015-01-15 DIAGNOSIS — J449 Chronic obstructive pulmonary disease, unspecified: Secondary | ICD-10-CM | POA: Insufficient documentation

## 2015-01-15 DIAGNOSIS — I2510 Atherosclerotic heart disease of native coronary artery without angina pectoris: Secondary | ICD-10-CM | POA: Diagnosis not present

## 2015-01-15 MED ORDER — METOPROLOL TARTRATE 25 MG PO TABS
25.0000 mg | ORAL_TABLET | Freq: Two times a day (BID) | ORAL | Status: DC
Start: 2015-01-15 — End: 2016-06-03

## 2015-01-15 NOTE — Progress Notes (Signed)
Office Visit    Patient Name: Jacob Ford Date of Encounter: 01/15/2015  Primary Care Provider:  Derwood Kaplan, MD Primary Cardiologist:  Judie Petit. Kirke Corin, MD   Chief Complaint    75 year old male status post recent non-STEMI who presents for follow-up.  Past Medical History    Past Medical History  Diagnosis Date  . COPD (chronic obstructive pulmonary disease) (HCC)   . Hypertensive heart disease   . Tobacco abuse   . CAD (coronary artery disease)     a. 2002 Cath: Nonobs dzs per patient;  b. 11/2014 NSTEMI/PCI: LM nl, LAD 30p, 60p, 80d, D1/2/3 min irregs, RI min irregs, LCX, 59m, OM1 min irregs, OM2 nl, OM3 50ost, RCA 40p, 100d (2.25x18 Xience Alpine DES), EF 55-65%.  . Hyperlipidemia   . Tobacco abuse    Past Surgical History  Procedure Laterality Date  . Shoulder surgery Left   . Cardiac catheterization    . Cardiac catheterization Bilateral 12/08/2014    Procedure: Left Heart Cath and Coronary Angiography;  Surgeon: Iran Ouch, MD;  Location: [facility] INVASIVE CV LAB;  Service: Cardiovascular;  Laterality: Bilateral;  . Cardiac catheterization N/A 12/08/2014    Procedure: Coronary Stent Intervention;  Surgeon: Iran Ouch, MD;  Location: [facility] INVASIVE CV LAB;  Service: Cardiovascular;  Laterality: N/A;    Allergies  No Known Allergies  History of Present Illness    74 year old male with the above past medical history. At the end of November, he was admitted to [facility] with chest pain and ruled in for non-STEMI. He underwent catheterization which showed an occluded distal RCA as well as moderately severe distal LAD disease. The RCA was felt to be the culprit and was successfully stented using a drug eluting stent. The LAD was left for medical therapy with plan for outpatient Myoview. Since discharge, patient has done well. He has not had any recurrence of chest pain. He has some degree of chronic dyspnea on exertion related to ongoing tobacco abuse,  currently smoking half a pack a day. Dyspnea is often associated with wheezing and resolves with inhaler therapy. He has also had some fatigue since discharge, noting that if he is just sitting around, he will most certainly fall asleep. He thinks this is secondary to his medications. He denies PND, orthopnea, dizziness, syncope, edema, or early satiety. He reports compliance with his medications.  Home Medications    Prior to Admission medications   Medication Sig Start Date End Date Taking? Authorizing Provider  acetaminophen (TYLENOL) 500 MG tablet Take 1,000 mg by mouth every 6 (six) hours as needed for mild pain.   Yes Historical Provider, MD  aspirin EC 81 MG EC tablet Take 1 tablet (81 mg total) by mouth daily. 12/09/14  Yes Enedina Finner, MD  atorvastatin (LIPITOR) 80 MG tablet Take 1 tablet (80 mg total) by mouth daily at 6 PM. 12/09/14  Yes Enedina Finner, MD  Fluticasone Furoate-Vilanterol (BREO ELLIPTA) 100-25 MCG/INH AEPB Inhale 1 puff into the lungs daily.    Yes Historical Provider, MD  HYDROcodone-acetaminophen (NORCO/VICODIN) 5-325 MG tablet Take 1-2 tablets by mouth every 4 (four) hours as needed for moderate pain. 12/09/14  Yes Enedina Finner, MD  lisinopril-hydrochlorothiazide (PRINZIDE,ZESTORETIC) 20-25 MG tablet Take 1 tablet by mouth daily.   Yes Historical Provider, MD  meloxicam (MOBIC) 15 MG tablet Take 15 mg by mouth daily.   Yes Historical Provider, MD  metoprolol (LOPRESSOR) 25 MG tablet Take 1 tablet (25 mg total)  by mouth 2 (two) times daily. 01/15/15  Yes Ok Anishristopher R Haiden Rawlinson, NP  nitroGLYCERIN (NITROSTAT) 0.4 MG SL tablet Place 1 tablet (0.4 mg total) under the tongue every 5 (five) minutes as needed for chest pain. 12/09/14  Yes Antonieta Ibaimothy J Gollan, MD  NON FORMULARY Nicotine Patch   Yes Historical Provider, MD  ticagrelor (BRILINTA) 90 MG TABS tablet Take 1 tablet (90 mg total) by mouth 2 (two) times daily. 12/09/14  Yes Enedina FinnerSona Patel, MD    Review of Systems    As above, he has  some degree of chronic dyspnea on exertion with wheezing in the setting of COPD and tobacco abuse. Is also had some fatigue since discharge and sleeps often. He denies PND, orthopnea, dizziness, syncope, palpitations, edema, or early satiety.  All other systems reviewed and are otherwise negative except as noted above.  Physical Exam    VS:  BP 110/84 mmHg  Pulse 71  Ht 6' 3.5" (1.918 m)  Wt 222 lb 12 oz (101.039 kg)  BMI 27.47 kg/m2 , BMI Body mass index is 27.47 kg/(m^2). GEN: Well nourished, well developed, in no acute distress. HEENT: normal. Neck: Supple, no JVD, carotid bruits, or masses. Cardiac: RRR, no murmurs, rubs, or gallops. No clubbing, cyanosis, edema.  Radials/DP/PT 2+ and equal bilaterally. Right wrist catheterization site without bleeding, bruit, or hematoma. Respiratory:  Respirations regular and unlabored, clear to auscultation bilaterally. GI: Soft, nontender, nondistended, BS + x 4. MS: no deformity or atrophy. Skin: warm and dry, no rash. Neuro:  Strength and sensation are intact. Psych: Normal affect.  Accessory Clinical Findings    ECG - regular sinus rhythm with first-degree AV block, PVCs, borderline LVH, no acute ST or T changes.  Assessment & Plan    1.  Non-ST segment elevation myocardial infarction, subsequent episode of care/coronary artery disease: Status post PCI drug loading stent placement to the occluded right coronary artery in late November. He has residual 80% stenosis in the LAD which has been medically managed. He has not been experiencing any chest pain but does have chronic dyspnea on exertion in the setting of COPD and ongoing tobacco abuse. As recommended at the time of catheterization, I will arrange for a Lexi scan Cardiolite to rule out ischemia in the anterior/apical distribution and determine whether or not he requires PCI of the LAD. Continue aspirin, statin, beta blocker, ACE inhibitor, and Brilinta therapy. Of note, he has had fatigue  and I will reduce his beta blocker to 25 mg twice a day to see if this helps.  2. Hypertensive heart disease: Blood pressure is currently stable. Continue lisinopril-HCTZ. Reducing beta blocker as above in the setting of recent fatigue.  3. Hyperlipidemia: LDL was 129 during hospitalization. He has been on high potency Lipitor for about 6 weeks now and I will arrange for fasting lipids and LFTs today.  4. Ongoing tobacco abuse/COPD: Complete cessation advised. He has cut down to half a pack a day. He does use an inhaler for wheezing and dyspnea.  5. Disposition: Labs today. Follow-up in 3 months or sooner if necessary.   Nicolasa Duckinghristopher Joe Tanney, NP 01/15/2015, 12:42 PM

## 2015-01-15 NOTE — Patient Instructions (Addendum)
Medication Instructions:  Your physician has recommended you make the following change in your medication:  DECREASE metoprolol to 25mg  twice daily   Labwork: Lipid and liver profile today  Testing/Procedures: Your physician has requested that you have a lexiscan myoview. For further information please visit [email or web address]. Please follow instruction sheet, as given.  [facility] MYOVIEW  Your caregiver has ordered a Stress Test with nuclear imaging. The purpose of this test is to evaluate the blood supply to your heart muscle. This procedure is referred to as a "Non-Invasive Stress Test." This is because other than having an IV started in your vein, nothing is inserted or "invades" your body. Cardiac stress tests are done to find areas of poor blood flow to the heart by determining the extent of coronary artery disease (CAD). Some patients exercise on a treadmill, which naturally increases the blood flow to your heart, while others who are  unable to walk on a treadmill due to physical limitations have a pharmacologic/chemical stress agent called Lexiscan . This medicine will mimic walking on a treadmill by temporarily increasing your coronary blood flow.   Please note: these test may take anywhere between 2-4 hours to complete  [facility]  THE VOLUNTEERS AT THE FIRST DESK WILL DIRECT YOU WHERE TO GO  Date of Procedure: Monday, Jan 9, 9am  Arrival Time for Procedure: 8:45am  Instructions regarding medication:     ___xx_:  Hold metoprolol the night before procedure and morning of procedure  _xx___:  Hold other medications as follows: lisinopril-HCTZ the morning of your test. You may resume afterwards.  PLEASE NOTIFY THE OFFICE AT LEAST 24 HOURS IN ADVANCE IF YOU ARE UNABLE TO KEEP YOUR APPOINTMENT.  [phone] AND  PLEASE NOTIFY [facility] AT [facility] AT LEAST 24 HOURS IN ADVANCE IF YOU ARE UNABLE TO KEEP YOUR APPOINTMENT. [phone]  How to  prepare for your Myoview test:   Do not eat or drink after midnight  No caffeine for 24 hours prior to test  No smoking 24 hours prior to test.  Your medication may be taken with water.  If your doctor stopped a medication because of this test, do not take that medication.  Ladies, please do not wear dresses.  Skirts or pants are appropriate. Please wear a short sleeve shirt.  No perfume, cologne or lotion.  Wear comfortable walking shoes. No heels!            Follow-Up: Your physician recommends that you schedule a follow-up appointment in: 3 months with Dr. Kirke CorinArida.    Any Other Special Instructions Will Be Listed Below (If Applicable).     If you need a refill on your cardiac medications before your next appointment, please call your pharmacy.  Cardiac Nuclear Scanning A cardiac nuclear scan is used to check your heart for problems, such as the following:  A portion of the heart is not getting enough blood.  Part of the heart muscle has died, which happens with a heart attack.  The heart wall is not working normally.  In this test, a radioactive dye (tracer) is injected into your bloodstream. After the tracer has traveled to your heart, a scanning device is used to measure how much of the tracer is absorbed by or distributed to various areas of your heart. LET [facility] KNOW ABOUT:  Any allergies you have.  All medicines you are taking, including vitamins, herbs, eye drops, creams, and over-the-counter medicines.  Previous problems you  or members of your family have had with the use of anesthetics.  Any blood disorders you have.  Previous surgeries you have had.  Medical conditions you have.  RISKS AND COMPLICATIONS Generally, this is a safe procedure. However, as with any procedure, problems can occur. Possible problems include:   Serious chest pain.  Rapid heartbeat.  Sensation of warmth in your chest. This usually passes  quickly. BEFORE THE PROCEDURE Ask your health care provider about changing or stopping your regular medicines. PROCEDURE This procedure is usually done at a hospital and takes 2-4 hours.  An IV tube is inserted into one of your veins.  Your health care provider will inject a small amount of radioactive tracer through the tube.  You will then wait for 20-40 minutes while the tracer travels through your bloodstream.  You will lie down on an exam table so images of your heart can be taken. Images will be taken for about 15-20 minutes.  You will exercise on a treadmill or stationary bike. While you exercise, your heart activity will be monitored with an electrocardiogram (ECG), and your blood pressure will be checked.  If you are unable to exercise, you may be given a medicine to make your heart beat faster.  When blood flow to your heart has peaked, tracer will again be injected through the IV tube.  After 20-40 minutes, you will get back on the exam table and have more images taken of your heart.  When the procedure is over, your IV tube will be removed. AFTER THE PROCEDURE  You will likely be able to leave shortly after the test. Unless your health care provider tells you otherwise, you may return to your normal schedule, including diet, activities, and medicines.  Make sure you find out how and when you will get your test results.   This information is not intended to replace advice given to you by your health care provider. Make sure you discuss any questions you have with your health care provider.   Document Released: 01/22/2004 Document Revised: 01/01/2013 Document Reviewed: 12/05/2012 Elsevier Interactive Patient Education [employer].

## 2015-01-16 LAB — LIPID PANEL
CHOL/HDL RATIO: 3.4 ratio (ref 0.0–5.0)
CHOLESTEROL TOTAL: 122 mg/dL (ref 100–199)
HDL: 36 mg/dL — ABNORMAL LOW (ref >39)
LDL CALC: 72 mg/dL (ref 0–99)
Triglycerides: 68 mg/dL (ref 0–149)
VLDL CHOLESTEROL CAL: 14 mg/dL (ref 5–40)

## 2015-01-16 LAB — HEPATIC FUNCTION PANEL: Bilirubin, Direct: 0.09 mg/dL (ref 0.00–0.40)

## 2015-01-19 ENCOUNTER — Encounter
Admission: RE | Admit: 2015-01-19 | Discharge: 2015-01-19 | Disposition: A | Discharge status: Home / Self Care | Payer: Medicare Other | Source: Ambulatory Visit | Attending: Nurse Practitioner | Admitting: Nurse Practitioner

## 2015-01-19 DIAGNOSIS — I2510 Atherosclerotic heart disease of native coronary artery without angina pectoris: Secondary | ICD-10-CM | POA: Diagnosis not present

## 2015-01-19 LAB — NM MYOCAR MULTI W/SPECT W/WALL MOTION / EF
CHL CUP RESTING HR STRESS: 65 {beats}/min
CSEPHR: 59 %
CSEPPHR: 87 {beats}/min
LV dias vol: 101 mL
LVSYSVOL: 50 mL
SDS: 1
SRS: 11
SSS: 9
TID: 1.11

## 2015-01-19 MED ORDER — TECHNETIUM TC 99M SESTAMIBI GENERIC - CARDIOLITE
30.0000 | Freq: Once | INTRAVENOUS | Status: AC | PRN
  Administered 2015-01-19: 33.081 via INTRAVENOUS

## 2015-01-19 MED ORDER — TECHNETIUM TC 99M SESTAMIBI GENERIC - CARDIOLITE
13.9300 | Freq: Once | INTRAVENOUS | Status: AC | PRN
  Administered 2015-01-19: 13.93 via INTRAVENOUS

## 2015-01-19 MED ORDER — REGADENOSON 0.4 MG/5ML IV SOLN
0.4000 mg | Freq: Once | INTRAVENOUS | Status: AC
  Administered 2015-01-19: 0.4 mg via INTRAVENOUS
  Filled 2015-01-19: qty 5

## 2015-04-13 ENCOUNTER — Other Ambulatory Visit: Payer: Self-pay

## 2015-04-13 ENCOUNTER — Telehealth: Payer: Self-pay | Admitting: Cardiovascular Disease

## 2015-04-13 MED ORDER — TICAGRELOR 90 MG PO TABS: 90.0000 mg | ORAL_TABLET | Freq: Two times a day (BID) | ORAL | Status: DC

## 2015-04-13 NOTE — Telephone Encounter (Signed)
S/w pt who reports "a black thing around my waist. The skin is dark and itches." Reports this has been going on for a few weeks. He thinks it is a bruise however, he does not recall bumping into anything. Pt takes brilinta 90mg  BID for RCA stent which was placed November 2016. Denies blood in urine or stool, denies hemoptysis or nose bleeds.  Reviewed s/s that would require immediate attention (those listed above). Pt verbalized understanding to continue medications as prescribed, monitor bruising and to call if experiencing blood in urine or stool, hemoptysis or nose bleeds.

## 2015-04-13 NOTE — Telephone Encounter (Signed)
Pt calling stating he got a stent put in about a month ago (can hardly understand patient)  He is having some issues needs to know if he needs to come in to the office and see us.

## 2015-07-03 ENCOUNTER — Encounter: Payer: Self-pay | Admitting: Nurse Practitioner

## 2015-07-03 ENCOUNTER — Ambulatory Visit (INDEPENDENT_AMBULATORY_CARE_PROVIDER_SITE_OTHER): Payer: Medicare Other | Admitting: Nurse Practitioner

## 2015-07-03 VITALS — BP 98/72 | HR 79 | Ht 74.0 in | Wt 215.5 lb

## 2015-07-03 DIAGNOSIS — Z720 Tobacco use: Secondary | ICD-10-CM | POA: Diagnosis not present

## 2015-07-03 DIAGNOSIS — E785 Hyperlipidemia, unspecified: Secondary | ICD-10-CM

## 2015-07-03 DIAGNOSIS — I119 Hypertensive heart disease without heart failure: Secondary | ICD-10-CM | POA: Diagnosis not present

## 2015-07-03 DIAGNOSIS — I2510 Atherosclerotic heart disease of native coronary artery without angina pectoris: Secondary | ICD-10-CM | POA: Diagnosis not present

## 2015-07-03 NOTE — Patient Instructions (Signed)
Medication Instructions:  Your physician recommends that you continue on your current medications as directed. Please refer to the Current Medication list given to you today.   Labwork: none  Testing/Procedures: none  Follow-Up: Your physician wants you to follow-up in: six months with Dr. Arida.  You will receive a reminder letter in the mail two months in advance. If you don't receive a letter, please call our office to schedule the follow-up appointment.   Any Other Special Instructions Will Be Listed Below (If Applicable).     If you need a refill on your cardiac medications before your next appointment, please call your pharmacy.   

## 2015-07-03 NOTE — Progress Notes (Signed)
Office Visit    Patient Name: Jacob Ford W Siska Date of Encounter: 07/03/2015  Primary Care Provider:  Derwood KaplanEason,  Ernest B, MD Primary Cardiologist:  Judie PetitM. Kirke CorinArida, MD   Chief Complaint    75 year old male with history of CAD status post RCA stenting in the setting of a non-STEMI in November 2016, who presents for follow-up.  Past Medical History    Past Medical History  Diagnosis Date  . COPD (chronic obstructive pulmonary disease) (HCC)   . Hypertensive heart disease   . Tobacco abuse   . CAD (coronary artery disease)     a. 2002 Cath: Nonobs dzs per patient;  b. 11/2014 NSTEMI/PCI: LM nl, LAD 30p, 60p, 80d, D1/2/3 min irregs, RI min irregs, LCX, 1658m, OM1 min irregs, OM2 nl, OM3 50ost, RCA 40p, 100d (2.25x18 Xience Alpine DES), EF 55-65%; c. 01/2015 MV: nl EF, no ischemia.  . Hyperlipidemia   . Tobacco abuse    Past Surgical History  Procedure Laterality Date  . Shoulder surgery Left   . Cardiac catheterization    . Cardiac catheterization Bilateral 12/08/2014    Procedure: Left Heart Cath and Coronary Angiography;  Surgeon: [name], MD;  Location: [facility] INVASIVE CV LAB;  Service: Cardiovascular;  Laterality: Bilateral;  . Cardiac catheterization N/A 12/08/2014    Procedure: Coronary Stent Intervention;  Surgeon: [name], MD;  Location: [facility] INVASIVE CV LAB;  Service: Cardiovascular;  Laterality: N/A;    Allergies  No Known Allergies  History of Present Illness    75 year old male with the above past medical history. He was [facility] with chest pain and ruled in for non-STEMI in November 2016. He underwent catheterization which showed an occluded distal RCA as well as moderately severe distal LAD disease. The RCA was felt to be the culprit and was successfully stented using a drug eluting stent. The LAD was left for medical therapy with plan for outpatient Myoview. Myoview was performed in January 2017 and showed no evidence of ischemia. He has  been medically managed since. He was last seen in clinic in January. Since then, he has done well. He is not particularly active saying, that he sits around watching TV most of the time. He does mow his lawn on a riding mower. He has not been having any chest pain or dyspnea and further denies PND, orthopnea, dizziness, syncope, edema, or early satiety. He reports compliance with his medications. Unfortunately, he continues to smoke 1 pack per day. He says he did quit a few months ago but went right back to it.   Home Medications    Prior to Admission medications   Medication Sig Start Date End Date Taking? Authorizing Provider  acetaminophen (TYLENOL) 500 MG tablet Take 1,000 mg by mouth every 6 (six) hours as needed for mild pain.   Yes Historical Provider, MD  aspirin EC 81 MG EC tablet Take 1 tablet (81 mg total) by mouth daily. 12/09/14  Yes Enedina FinnerSona Patel, MD  atorvastatin (LIPITOR) 80 MG tablet Take 1 tablet (80 mg total) by mouth daily at 6 PM. 12/09/14  Yes Enedina FinnerSona Patel, MD  lisinopril-hydrochlorothiazide (PRINZIDE,ZESTORETIC) 20-25 MG tablet Take 1 tablet by mouth daily.   Yes Historical Provider, MD  metoprolol (LOPRESSOR) 25 MG tablet Take 1 tablet (25 mg total) by mouth 2 (two) times daily. 01/15/15  Yes Ok Anishristopher R Maxtyn Nuzum, NP  montelukast (SINGULAIR) 10 MG tablet Take 10 mg by mouth at bedtime.   Yes Historical Provider, MD  nitroGLYCERIN (NITROSTAT) 0.4 MG SL tablet Place 1 tablet (0.4 mg total) under the tongue every 5 (five) minutes as needed for chest pain. 12/09/14  Yes Antonieta Ibaimothy J Gollan, MD  ticagrelor (BRILINTA) 90 MG TABS tablet Take 1 tablet (90 mg total) by mouth 2 (two) times daily. 04/13/15  Yes Antonieta Ibaimothy J Gollan, MD    Review of Systems    He denies chest pain, palpitations, dyspnea, pnd, orthopnea, n, v, dizziness, syncope, edema, weight gain, or early satiety.  All other systems reviewed and are otherwise negative except as noted above.  Physical Exam    VS:  BP 98/72 mmHg   Pulse 79  Ht 6\' 2"  (1.88 m)  Wt 215 lb 8 oz (97.75 kg)  BMI 27.66 kg/m2 , BMI Body mass index is 27.66 kg/(m^2). GEN: Well nourished, well developed, in no acute distress. HEENT: normal. Neck: Supple, no JVD, carotid bruits, or masses. Cardiac: RRR, no murmurs, rubs, or gallops. No clubbing, cyanosis, edema.  Radials/DP/PT 1+ and equal bilaterally.  Respiratory:  Respirations regular and unlabored, diminished breath sounds bilaterally  GI: Soft, nontender, nondistended, BS + x 4. MS: no deformity or atrophy. Skin: warm and dry, no rash. Neuro:  Strength and sensation are intact. Psych: Normal affect.  Accessory Clinical Findings    ECG - Regular sinus rhythm, 79, no acute ST or T changes.  Assessment & Plan    1.  Coronary artery disease: Status post non-ST elevation MI in November 2016 with stenting of the right coronary artery. He has residual LAD disease with a nonischemic Myoview in January this year. He has been doing well without any recurrence of chest pain or dyspnea. He remains on aspirin, statin, beta blocker, ACE inhibitor, and Brilinta therapy and reports compliance. I have encouraged him to increase his activity as he reports a fairly sedentary lifestyle.  2. Hypertensive heart disease: Blood pressure is stable and if anything on the low side at 98/72. He is a symptomatic. He remains on beta blocker, ACE inhibitor, and diuretic therapy.  3. Hyperlipidemia: He remains on Lipitor 80 with an LDL of 72 in January.  4. Ongoing tobacco abuse: Complete cessation advised. He says he tried to quit a few months ago but gave up on his effort. He will try again.  5. Disposition: Follow-up with Dr. Kirke CorinArida in 6 months.  Nicolasa Duckinghristopher Leviticus Harton, NP 07/03/2015, 3:59 PM

## 2015-08-25 ENCOUNTER — Other Ambulatory Visit: Payer: Self-pay | Admitting: Cardiovascular Disease

## 2015-12-15 ENCOUNTER — Encounter: Payer: Self-pay | Admitting: Cardiovascular Disease

## 2015-12-15 ENCOUNTER — Ambulatory Visit (INDEPENDENT_AMBULATORY_CARE_PROVIDER_SITE_OTHER): Payer: Medicare Other | Admitting: Cardiovascular Disease

## 2015-12-15 VITALS — BP 118/80 | HR 74 | Ht 74.0 in | Wt 217.0 lb

## 2015-12-15 DIAGNOSIS — I739 Peripheral vascular disease, unspecified: Secondary | ICD-10-CM

## 2015-12-15 DIAGNOSIS — I10 Essential (primary) hypertension: Secondary | ICD-10-CM

## 2015-12-15 DIAGNOSIS — Z720 Tobacco use: Secondary | ICD-10-CM

## 2015-12-15 DIAGNOSIS — I119 Hypertensive heart disease without heart failure: Secondary | ICD-10-CM | POA: Diagnosis not present

## 2015-12-15 DIAGNOSIS — I25111 Atherosclerotic heart disease of native coronary artery with angina pectoris with documented spasm: Secondary | ICD-10-CM | POA: Diagnosis not present

## 2015-12-15 MED ORDER — CLOPIDOGREL BISULFATE 75 MG PO TABS: 75.0000 mg | ORAL_TABLET | Freq: Every day | ORAL | 5 refills | Status: DC

## 2015-12-15 NOTE — Patient Instructions (Signed)
Medication Instructions:  Your physician has recommended you make the following change in your medication:  STOP taking brilinta START taking Plavix 75mg  once daily   Labwork: none  Testing/Procedures: Your physician has requested that you have a lower extremity arterial duplex. During this test, exercise and ultrasound are used to evaluate arterial blood flow in the legs. Allow one hour for this exam. There are no restrictions or special instructions.   Follow-Up: Your physician wants you to follow-up in: 6 months with Dr. Kirke CorinArida. You will receive a reminder letter in the mail two months in advance. If you don't receive a letter, please call our office to schedule the follow-up appointment.   Any Other Special Instructions Will Be Listed Below (If Applicable).     If you need a refill on your cardiac medications before your next appointment, please call your pharmacy.

## 2015-12-15 NOTE — Progress Notes (Signed)
Cardiology Office Note   Date:  12/15/2015   ID:  [name], DOB [birth year], MRN [id number]  PCP:  Derwood KaplanEason,  Ernest B, MD  Cardiologist:   Lorine BearsMuhammad Laveda Demedeiros, MD   Chief Complaint  Patient presents with  . other    6 month follow up. Meds reviewed by the pt. verbally. Pt. c/o shortness of breath with over exertion and cramping in right calf when walking       History of Present Illness: Jacob Ford is a 75 y.o. male who presents for A follow-up visit regarding coronary artery disease. He had non-ST elevation myocardial infarction November 2016. Cardiac catheterization showed occluded distal RCA with significant distal LAD disease. He underwent angioplasty and drug-eluting stent placement to the distal right coronary artery. Distal LAD disease was treated medically. Nuclear stress test in January 2017 showed no evidence of ischemia. He has other chronic medical conditions that include hypertension, hyperlipidemia, tobacco use and COPD. He has been doing reasonably well and denies any chest pain. He has stable exertional dyspnea but his biggest complaint today is right calf discomfort after walking about half a block. This forces him to stop and rest for few minutes before he can resume. He has no rest pain or lower extremity ulceration.   Past Medical History:  Diagnosis Date  . CAD (coronary artery disease)    a. 2002 Cath: Nonobs dzs per patient;  b. 11/2014 NSTEMI/PCI: LM nl, LAD 30p, 60p, 80d, D1/2/3 min irregs, RI min irregs, LCX, 6458m, OM1 min irregs, OM2 nl, OM3 50ost, RCA 40p, 100d (2.25x18 Xience Alpine DES), EF 55-65%; c. 01/2015 MV: nl EF, no ischemia.  [name]. COPD (chronic obstructive pulmonary disease) (HCC)   . Hyperlipidemia   . Hypertensive heart disease   . Tobacco abuse   . Tobacco abuse     Past Surgical History:  Procedure Laterality Date  . CARDIAC CATHETERIZATION    . CARDIAC CATHETERIZATION Bilateral 12/08/2014   Procedure: Left Heart Cath and Coronary  Angiography;  Surgeon: Iran OuchMuhammad A Neev Mcmains, MD;  Location: [facility] INVASIVE CV LAB;  Service: Cardiovascular;  Laterality: Bilateral;  . CARDIAC CATHETERIZATION N/A 12/08/2014   Procedure: Coronary Stent Intervention;  Surgeon: Iran OuchMuhammad A Akeila Lana, MD;  Location: [facility] INVASIVE CV LAB;  Service: Cardiovascular;  Laterality: N/A;  . SHOULDER SURGERY Left      Current Outpatient Prescriptions  Medication Sig Dispense Refill  . aspirin EC 81 MG EC tablet Take 1 tablet (81 mg total) by mouth daily. 30 tablet 2  . atorvastatin (LIPITOR) 80 MG tablet Take 1 tablet (80 mg total) by mouth daily at 6 PM. 30 tablet 2  . BREO ELLIPTA 100-25 MCG/INH AEPB Inhale 1 puff into the lungs daily.     [name]. BRILINTA 90 MG TABS tablet TAKE ONE TABLET BY MOUTH TWICE DAILY 60 tablet 3  . lisinopril-hydrochlorothiazide (PRINZIDE,ZESTORETIC) 20-25 MG tablet Take 1 tablet by mouth daily.    . metoprolol (LOPRESSOR) 25 MG tablet Take 1 tablet (25 mg total) by mouth 2 (two) times daily. 30 tablet 0  . montelukast (SINGULAIR) 10 MG tablet Take 10 mg by mouth at bedtime.    . nitroGLYCERIN (NITROSTAT) 0.4 MG SL tablet Place 1 tablet (0.4 mg total) under the tongue every 5 (five) minutes as needed for chest pain. 25 tablet 3   No current facility-administered medications for this visit.     Allergies:   Patient has no known allergies.    Social History:  The patient  reports that he has been smoking Cigarettes.  He has been smoking about 1.00 pack per day. He has never used smokeless tobacco. He reports that he does not drink alcohol or use drugs.   Family History:  The patient's family history includes CAD in his brother and brother; Hypertension in his father and mother; Stroke in his sister and sister.    ROS:  Please see the history of present illness.   Otherwise, review of systems are positive for none.   All other systems are reviewed and negative.    PHYSICAL EXAM: VS:  BP 118/80 (BP Location: Left Arm, Patient  Position: Sitting, Cuff Size: Normal)   Pulse 74   Ht 6\' 2"  (1.88 m)   Wt 217 lb (98.4 kg)   BMI 27.86 kg/m  , BMI Body mass index is 27.86 kg/m. GEN: Well nourished, well developed, in no acute distress  HEENT: normal  Neck: no JVD, carotid bruits, or masses Cardiac: RRR; no murmurs, rubs, or gallops,no edema  Respiratory:  clear to auscultation bilaterally, normal work of breathing GI: soft, nontender, nondistended, + BS MS: no deformity or atrophy  Skin: warm and dry, no rash Neuro:  Strength and sensation are intact Psych: euthymic mood, full affect Vascular: Femoral pulse is normal and wide (possible aneurysmal femoral arteries). Distal pulses are not palpable.  EKG:  EKG is ordered today. The ekg ordered today demonstrates  sinus rhythm with PVC.   Recent Labs: No results found for requested labs within last 8760 hours.    Lipid Panel    Component Value Date/Time   CHOL 122 01/15/2015 1110   CHOL 161 12/14/2012 0346   TRIG 68 01/15/2015 1110   TRIG 140 12/14/2012 0346   HDL 36 (L) 01/15/2015 1110   HDL 47 12/14/2012 0346   CHOLHDL 3.4 01/15/2015 1110   CHOLHDL 4.6 12/08/2014 0337   VLDL 10 12/08/2014 0337   VLDL 28 12/14/2012 0346   LDLCALC 72 01/15/2015 1110   LDLCALC 86 12/14/2012 0346      Wt Readings from Last 3 Encounters:  12/15/15 217 lb (98.4 kg)  07/03/15 215 lb 8 oz (97.8 kg)  01/15/15 222 lb 12 oz (101 kg)      No flowsheet data found.    ASSESSMENT AND PLAN:  1.  Coronary artery disease involving native coronary arteries without angina: He is doing reasonably well from a cardiac standpoint. It has been more than one year since his myocardial infarction and drug-eluting stent placement. I switched Brilinta to clopidogrel 75 mg once daily.  2. Essential hypertension: Blood pressure is controlled on current medications.  3. Hyperlipidemia: Continue high dose atorvastatin. Most recent LDL was 72.  4. Tobacco use: He continues to smoke one  pack per day and is not able to quit.  5. Bilateral calf claudication worse on the right side: Likely due to underlying peripheral arterial disease. I requested lower extremity arterial Doppler.    Disposition:   FU with me in 6 months  Signed,  Lorine BearsMuhammad Moet Mikulski, MD  12/15/2015 2:23 PM    [facility]

## 2015-12-16 ENCOUNTER — Other Ambulatory Visit: Payer: Self-pay | Admitting: Cardiovascular Disease

## 2015-12-16 DIAGNOSIS — I739 Peripheral vascular disease, unspecified: Secondary | ICD-10-CM

## 2015-12-18 ENCOUNTER — Ambulatory Visit: Payer: Medicare Other

## 2015-12-18 DIAGNOSIS — I739 Peripheral vascular disease, unspecified: Secondary | ICD-10-CM | POA: Diagnosis not present

## 2015-12-22 ENCOUNTER — Other Ambulatory Visit: Payer: Self-pay

## 2015-12-22 DIAGNOSIS — I723 Aneurysm of iliac artery: Secondary | ICD-10-CM

## 2015-12-22 NOTE — Progress Notes (Unsigned)
.  sw

## 2015-12-25 ENCOUNTER — Encounter (INDEPENDENT_AMBULATORY_CARE_PROVIDER_SITE_OTHER): Payer: Self-pay | Admitting: Vascular Surgery

## 2015-12-25 ENCOUNTER — Ambulatory Visit (INDEPENDENT_AMBULATORY_CARE_PROVIDER_SITE_OTHER): Payer: Medicare Other | Admitting: Vascular Surgery

## 2015-12-25 VITALS — BP 123/80 | HR 78 | Resp 16 | Ht 74.0 in | Wt 215.6 lb

## 2015-12-25 DIAGNOSIS — I10 Essential (primary) hypertension: Secondary | ICD-10-CM | POA: Diagnosis not present

## 2015-12-25 DIAGNOSIS — F17210 Nicotine dependence, cigarettes, uncomplicated: Secondary | ICD-10-CM | POA: Diagnosis not present

## 2015-12-25 DIAGNOSIS — I714 Abdominal aortic aneurysm, without rupture, unspecified: Secondary | ICD-10-CM

## 2015-12-25 DIAGNOSIS — I2510 Atherosclerotic heart disease of native coronary artery without angina pectoris: Secondary | ICD-10-CM | POA: Diagnosis not present

## 2015-12-25 DIAGNOSIS — E785 Hyperlipidemia, unspecified: Secondary | ICD-10-CM | POA: Diagnosis not present

## 2015-12-25 DIAGNOSIS — Z720 Tobacco use: Secondary | ICD-10-CM

## 2015-12-25 NOTE — Assessment & Plan Note (Signed)
We had a discussion for approximately 3-4 minutes regarding the absolute need for smoking cessation due to the deleterious nature of tobacco on the vascular system. Specifically, its increasing the risk of aneurysmal growth. We discussed the tobacco use would diminish patency of any intervention, and likely significantly worsen progressio of disease. We discussed multiple agents for quitting including replacement therapy or medications to reduce cravings such as Chantix. The patient voices their understanding of the importance of smoking cessation.

## 2015-12-25 NOTE — Assessment & Plan Note (Signed)
lipid control important in reducing the progression of atherosclerotic disease. Continue statin therapy  

## 2015-12-25 NOTE — Progress Notes (Signed)
Patient ID: Jacob Ford, male   DOB: Nov 04, 1940, 75 y.o.   MRN: [id number]  Chief Complaint  Patient presents with  . New Patient (Initial Visit)    HPI Jacob Ford is a 75 y.o. male.  I am asked to see the patient by Dr. 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  The patient reports claudication symptoms of both lower extremities. This prompted noninvasive studies which demonstrated moderately reduced ABIs bilaterally with bilateral SFA occlusions. On the aortoiliac portion of the study, significant aneurysmal degeneration was seen. His abdominal aortic portion measured 5.3 cm in the infrarenal location. His right common iliac artery measured about 3.4 cm with continued extension of the aneurysm down into the right external iliac artery. The left common iliac artery measured over 2-1/2 cm as well. He reports that his father had a cerebral aneurysm. He had no previous history of aneurysmal disease personally to his knowledge. He does smoke. He does have high blood pressure. He continues to complain of claudication symptoms in both lower extremities that are moderate in nature. He does not have ulceration or infection. He denies any current aneurysm related symptoms such as abdominal pain, back pain, or peripheral embolization.   Past Medical History:  Diagnosis Date  . CAD (coronary artery disease)    a. 2002 Cath: Nonobs dzs per patient;  b. 11/2014 NSTEMI/PCI: LM nl, LAD 30p, 60p, 80d, D1/2/3 min irregs, RI min irregs, LCX, 8231m, OM1 min irregs, OM2 nl, OM3 50ost, RCA 40p, 100d (2.25x18 Xience Alpine DES), EF 55-65%; c. 01/2015 MV: nl EF, no ischemia.  [name]. COPD (chronic obstructive pulmonary disease) (HCC)   . Hyperlipidemia   . Hypertension   . Hypertensive heart disease   . Myocardial infarction   . Tobacco abuse   . Tobacco abuse     Past Surgical History:  Procedure Laterality Date  . CARDIAC CATHETERIZATION    . CARDIAC CATHETERIZATION  Bilateral 12/08/2014   Procedure: Left Heart Cath and Coronary Angiography;  Surgeon: [name], MD;  Location: [facility];  Service: Cardiovascular;  Laterality: Bilateral;  . CARDIAC CATHETERIZATION N/A 12/08/2014   Procedure: Coronary Stent Intervention;  Surgeon: [name], MD;  Location: [facility];  Service: Cardiovascular;  Laterality: N/A;  . SHOULDER SURGERY Left     Family History  Problem Relation Age of Onset  . Hypertension Mother   . Hypertension Father   . CAD Brother   . CAD Brother   . Stroke Sister   . Stroke Sister    Cerebral aneurysm in his father  Social History Social History  Substance Use Topics  . Smoking status: Current Every Day Smoker    Packs/day: 1.00    Types: Cigarettes  . Smokeless tobacco: Never Used  . Alcohol use No   No IV drug use  No Known Allergies  Current Outpatient Prescriptions  Medication Sig Dispense Refill  . aspirin EC 81 MG EC tablet Take 1 tablet (81 mg total) by mouth daily. 30 tablet 2  . atorvastatin (LIPITOR) 80 MG tablet Take 1 tablet (80 mg total) by mouth daily at 6 PM. 30 tablet 2  . BREO ELLIPTA 100-25 MCG/INH AEPB Inhale 1 puff into the lungs daily.     . clopidogrel (PLAVIX) 75 MG tablet Take 1 tablet (75 mg total) by mouth daily. 30 tablet 5  . lisinopril-hydrochlorothiazide (PRINZIDE,ZESTORETIC) 20-25 MG tablet Take 1 tablet by mouth daily.    .[name]  metoprolol (LOPRESSOR) 25 MG tablet Take 1 tablet (25 mg total) by mouth 2 (two) times daily. 30 tablet 0  . montelukast (SINGULAIR) 10 MG tablet Take 10 mg by mouth at bedtime.    . nitroGLYCERIN (NITROSTAT) 0.4 MG SL tablet Place 1 tablet (0.4 mg total) under the tongue every 5 (five) minutes as needed for chest pain. 25 tablet 3   No current facility-administered medications for this visit.       REVIEW OF SYSTEMS (Negative unless checked)  Constitutional: [] Weight loss  [] Fever  [] Chills Cardiac: [x] Chest pain   [] Chest  pressure   [] Palpitations   [] Shortness of breath when laying flat   [] Shortness of breath at rest   [] Shortness of breath with exertion. Vascular:  [x] Pain in legs with walking   [] Pain in legs at rest   [] Pain in legs when laying flat   [x] Claudication   [] Pain in feet when walking  [] Pain in feet at rest  [] Pain in feet when laying flat   [] History of DVT   [] Phlebitis   [] Swelling in legs   [] Varicose veins   [] Non-healing ulcers Pulmonary:   [] Uses home oxygen   [] Productive cough   [] Hemoptysis   [] Wheeze  [] COPD   [] Asthma Neurologic:  [] Dizziness  [] Blackouts   [] Seizures   [] History of stroke   [] History of TIA  [] Aphasia   [] Temporary blindness   [] Dysphagia   [] Weakness or numbness in arms   [] Weakness or numbness in legs Musculoskeletal:  [] Arthritis   [] Joint swelling   [] Joint pain   [] Low back pain Hematologic:  [] Easy bruising  [] Easy bleeding   [] Hypercoagulable state   [] Anemic  [] Hepatitis Gastrointestinal:  [] Blood in stool   [] Vomiting blood  [] Gastroesophageal reflux/heartburn   [] Abdominal pain Genitourinary:  [] Chronic kidney disease   [] Difficult urination  [] Frequent urination  [] Burning with urination   [] Hematuria Skin:  [] Rashes   [] Ulcers   [] Wounds Psychological:  [] History of anxiety   []  History of major depression.    Physical Exam BP 123/80   Pulse 78   Resp 16   Ht 6\' 2"  (1.88 m)   Wt 215 lb 9.6 oz (97.8 kg)   BMI 27.68 kg/m  Gen:  WD/WN, NAD. Appears younger than stated age Head: [place]/AT, No temporalis wasting. Prominent temp pulse not noted. Ear/Nose/Throat: Hearing grossly intact, nares w/o erythema or drainage, oropharynx w/o Erythema/Exudate Eyes: Conjunctiva clear, sclera non-icteric  Neck: trachea midline.  No JVD.  Pulmonary:  Good air movement, equal bilaterally.  Cardiac: RRR, normal S1, S2 Vascular:  Vessel Right Left  Radial Palpable Palpable  Ulnar Palpable Palpable  Brachial Palpable Palpable      Aorta Palpable N/A  Femoral Palpable  Palpable  Popliteal Not Palpable Not Palpable  PT Trace Palpable 1+ Palpable  DP 1+ Palpable Trace Palpable   Gastrointestinal: soft, non-tender/non-distended. No guarding/reflex. No masses, surgical incisions, or scars.  Aortic impulse enlarged Musculoskeletal: M/S 5/5 throughout.  Extremities without ischemic changes.  No deformity or atrophy.  Neurologic: Sensation grossly intact in extremities.  Symmetrical.  Speech is fluent. Motor exam as listed above. Psychiatric: Judgment intact, Mood & affect appropriate for pt's clinical situation. Dermatologic: No rashes or ulcers noted.  No cellulitis or open wounds. Lymph : No Cervical, Axillary, or Inguinal lymphadenopathy.   Radiology No results found.  Labs No results found for this or any previous visit (from the past 2160 hour(s)).  Assessment/Plan:  Hyperlipidemia lipid control important in reducing the progression of atherosclerotic  disease. Continue statin therapy   Tobacco abuse We had a discussion for approximately 3-4 minutes regarding the absolute need for smoking cessation due to the deleterious nature of tobacco on the vascular system. Specifically, its increasing the risk of aneurysmal growth. We discussed the tobacco use would diminish patency of any intervention, and likely significantly worsen progressio of disease. We discussed multiple agents for quitting including replacement therapy or medications to reduce cravings such as Chantix. The patient voices their understanding of the importance of smoking cessation.   CAD (coronary artery disease) S/p coronary stent placement  Essential hypertension, benign blood pressure control important in reducing the progression of atherosclerotic disease and aneurysmal degeneration. On appropriate oral medications.   AAA (abdominal aortic aneurysm) without rupture (HCC) Recommend: The patient has an abdominal aortic aneurysm that is 5.3 cm by duplex scan and based on this study  it appears that it is suitable for endovascular treatment. 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  The patient is otherwise in reasonable health.   Therefore, the patient should undergo endovascular repair of the AAA to prevent future leathal rupture.   Patient will require CT angiography of the abdomen and pelvis in order to appropriately plan repair of the AAA.  The risks and benefits as well as the alternative therapies was discussed in detail with the patient. All questions were answered. The patient agrees to move forward the AAA repair and therefore with CT scan.  The patient will follow up with me in the office after the CT scan to review the study.      Festus BarrenJason Wing Schoch 12/25/2015, 12:06 PM   This note was created with Dragon medical transcription system.  Any errors from dictation are unintentional.

## 2015-12-25 NOTE — Assessment & Plan Note (Signed)
blood pressure control important in reducing the progression of atherosclerotic disease and aneurysmal degeneration. On appropriate oral medications.  

## 2015-12-25 NOTE — Patient Instructions (Signed)
Abdominal Aortic Aneurysm Blood pumps away from the heart through tubes (blood vessels) called arteries. Aneurysms are weak or damaged places in the wall of an artery. It bulges out like a balloon. An abdominal aortic aneurysm happens in the main artery of the body (aorta). It can burst or tear, causing bleeding inside the body. This is an emergency. It needs treatment right away. What are the causes? The exact cause is unknown. Things that could cause this problem include:  Fat and other substances building up in the lining of a tube.  Swelling of the walls of a blood vessel.  Certain tissue diseases.  Belly (abdominal) trauma.  An infection in the main artery of the body.  What increases the risk? There are things that make it more likely for you to have an aneurysm. These include:  Being over the age of 75 years old.  Having high blood pressure (hypertension).  Being a male.  Being white.  Being very overweight (obese).  Having a family history of aneurysm.  Using tobacco products.  What are the signs or symptoms? Symptoms depend on the size of the aneurysm and how fast it grows. There may not be symptoms. If symptoms occur, they can include:  Pain (belly, side, lower back, or groin).  Feeling full after eating a small amount of food.  Feeling sick to your stomach (nauseous), throwing up (vomiting), or both.  Feeling a lump in your belly that feels like it is beating (pulsating).  Feeling like you will pass out (faint).  How is this treated?  Medicine to control blood pressure and pain.  Imaging tests to see if the aneurysm gets bigger.  Surgery. How is this prevented? To lessen your chance of getting this condition:  Stop smoking. Stop chewing tobacco.  Limit or avoid alcohol.  Keep your blood pressure, blood sugar, and cholesterol within normal limits.  Eat less salt.  Eat foods low in saturated fats and cholesterol. These are found in animal and  whole dairy products.  Eat more fiber. Fiber is found in whole grains, vegetables, and fruits.  Keep a healthy weight.  Stay active and exercise often.  This information is not intended to replace advice given to you by your health care provider. Make sure you discuss any questions you have with your health care provider. Document Released: 04/23/2012 Document Revised: 06/04/2015 Document Reviewed: 01/27/2012 Elsevier Interactive Patient Education  2017 [employer]  

## 2015-12-25 NOTE — Assessment & Plan Note (Signed)
S/p coronary stent placement

## 2015-12-25 NOTE — Assessment & Plan Note (Signed)
Recommend: The patient has an abdominal aortic aneurysm that is 5.3 cm by duplex scan and based on this study it appears that it is suitable for endovascular treatment. 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  The patient is otherwise in reasonable health.   Therefore, the patient should undergo endovascular repair of the AAA to prevent future leathal rupture.   Patient will require CT angiography of the abdomen and pelvis in order to appropriately plan repair of the AAA.  The risks and benefits as well as the alternative therapies was discussed in detail with the patient. All questions were answered. The patient agrees to move forward the AAA repair and therefore with CT scan.  The patient will follow up with me in the office after the CT scan to review the study.

## 2015-12-29 ENCOUNTER — Ambulatory Visit
Admission: RE | Admit: 2015-12-29 | Discharge: 2015-12-29 | Disposition: A | Discharge status: Home / Self Care | Payer: Medicare Other | Source: Ambulatory Visit | Attending: Vascular Surgery | Admitting: Vascular Surgery

## 2015-12-29 DIAGNOSIS — I714 Abdominal aortic aneurysm, without rupture, unspecified: Secondary | ICD-10-CM

## 2015-12-29 DIAGNOSIS — I724 Aneurysm of artery of lower extremity: Secondary | ICD-10-CM | POA: Insufficient documentation

## 2015-12-29 DIAGNOSIS — N281 Cyst of kidney, acquired: Secondary | ICD-10-CM | POA: Diagnosis not present

## 2015-12-29 DIAGNOSIS — R938 Abnormal findings on diagnostic imaging of other specified body structures: Secondary | ICD-10-CM | POA: Insufficient documentation

## 2015-12-29 DIAGNOSIS — I771 Stricture of artery: Secondary | ICD-10-CM | POA: Diagnosis not present

## 2015-12-29 DIAGNOSIS — I723 Aneurysm of iliac artery: Secondary | ICD-10-CM | POA: Diagnosis not present

## 2015-12-29 DIAGNOSIS — N400 Enlarged prostate without lower urinary tract symptoms: Secondary | ICD-10-CM | POA: Diagnosis not present

## 2015-12-29 LAB — POCT I-STAT CREATININE: CREATININE: 1 mg/dL (ref 0.61–1.24)

## 2015-12-29 MED ORDER — IOPAMIDOL (ISOVUE-370) INJECTION 76%
100.0000 mL | Freq: Once | INTRAVENOUS | Status: AC | PRN
  Administered 2015-12-29: 100 mL via INTRAVENOUS

## 2016-01-01 ENCOUNTER — Telehealth: Payer: Self-pay | Admitting: Cardiovascular Disease

## 2016-01-01 ENCOUNTER — Encounter: Payer: Self-pay | Admitting: Cardiovascular Disease

## 2016-01-01 ENCOUNTER — Encounter (INDEPENDENT_AMBULATORY_CARE_PROVIDER_SITE_OTHER): Payer: Self-pay | Admitting: Vascular Surgery

## 2016-01-01 ENCOUNTER — Ambulatory Visit (INDEPENDENT_AMBULATORY_CARE_PROVIDER_SITE_OTHER): Payer: Medicare Other | Admitting: Vascular Surgery

## 2016-01-01 ENCOUNTER — Other Ambulatory Visit (INDEPENDENT_AMBULATORY_CARE_PROVIDER_SITE_OTHER): Payer: Self-pay | Admitting: Vascular Surgery

## 2016-01-01 ENCOUNTER — Encounter (INDEPENDENT_AMBULATORY_CARE_PROVIDER_SITE_OTHER): Payer: Self-pay

## 2016-01-01 VITALS — BP 120/74 | HR 83 | Resp 16 | Wt 216.0 lb

## 2016-01-01 DIAGNOSIS — I10 Essential (primary) hypertension: Secondary | ICD-10-CM

## 2016-01-01 DIAGNOSIS — E785 Hyperlipidemia, unspecified: Secondary | ICD-10-CM

## 2016-01-01 DIAGNOSIS — I714 Abdominal aortic aneurysm, without rupture, unspecified: Secondary | ICD-10-CM

## 2016-01-01 DIAGNOSIS — Z720 Tobacco use: Secondary | ICD-10-CM

## 2016-01-01 NOTE — Progress Notes (Signed)
MRN : [id number]  Jacob Ford is a 75 y.o. (19-Aug-1940) male who presents with chief complaint of  Chief Complaint  Patient presents with  . Follow-up  .  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 He has had no changes in his symptoms or problems since last week. He has no new complaints today. He continues to have short distance claudication bilaterally. I have independently reviewed his CT angiogram as described below. 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  Past Medical History:  Diagnosis Date  . CAD (coronary artery disease)    a. 2002 Cath: Nonobs dzs per patient;  b. 11/2014 NSTEMI/PCI: LM nl, LAD 30p, 60p, 80d, D1/2/3 min irregs, RI min irregs, LCX, 56m, OM1 min irregs, OM2 nl, OM3 50ost, RCA 40p, 100d (2.25x18 Xience Alpine DES), EF 55-65%; c. 01/2015 MV: nl EF, no ischemia.  [name] COPD (chronic obstructive pulmonary disease) (HCC)   . Hyperlipidemia   . Hypertension   . Hypertensive heart disease   . Myocardial infarction   . Tobacco abuse   . Tobacco abuse          Past Surgical History:  Procedure Laterality Date  . CARDIAC CATHETERIZATION    . CARDIAC CATHETERIZATION Bilateral 12/08/2014   Procedure: Left Heart Cath and Coronary Angiography;  Surgeon: Iran Ouch, MD;  Location: [facility] INVASIVE CV LAB;  Service: Cardiovascular;  Laterality: Bilateral;  . CARDIAC CATHETERIZATION N/A 12/08/2014   Procedure: Coronary Stent Intervention;  Surgeon: Iran Ouch, MD;  Location: [facility] INVASIVE CV LAB;  Service: Cardiovascular;  Laterality: N/A;  . SHOULDER SURGERY Left          Family History  Problem Relation Age of Onset  . Hypertension Mother   . Hypertension Father   . CAD Brother   . CAD  Brother   . Stroke Sister   . Stroke Sister    Cerebral aneurysm in his father  Social History      Social History  Substance Use Topics  . Smoking status: Current Every Day Smoker    Packs/day: 1.00    Types: Cigarettes  . Smokeless tobacco: Never Used  . Alcohol use No   No IV drug use  No Known Allergies        Current Outpatient Prescriptions  Medication Sig Dispense Refill  . aspirin EC 81 MG EC tablet Take 1 tablet (81 mg total) by mouth daily. 30 tablet 2  . atorvastatin (LIPITOR) 80 MG tablet Take 1 tablet (80 mg total) by mouth daily at 6 PM. 30 tablet 2  . BREO ELLIPTA 100-25 MCG/INH AEPB Inhale 1 puff into the lungs daily.     . clopidogrel (PLAVIX) 75 MG tablet Take 1 tablet (75 mg total) by mouth daily. 30 tablet 5  . lisinopril-hydrochlorothiazide (PRINZIDE,ZESTORETIC) 20-25 MG tablet Take 1 tablet by mouth daily.    . metoprolol (LOPRESSOR) 25 MG tablet Take 1 tablet (25 mg total) by mouth 2 (two) times daily. 30 tablet 0  . montelukast (SINGULAIR) 10 MG tablet Take 10 mg by mouth at bedtime.    . nitroGLYCERIN (NITROSTAT) 0.4 MG SL tablet Place 1 tablet (0.4 mg total) under the tongue every 5 (five) minutes as needed for chest pain. 25 tablet 3   No current facility-administered medications  for this visit.       REVIEW OF SYSTEMS (Negative unless checked)  Constitutional: [] Weight loss  [] Fever  [] Chills Cardiac: [x] Chest pain   [] Chest pressure   [] Palpitations   [] Shortness of breath when laying flat   [] Shortness of breath at rest   [] Shortness of breath with exertion. Vascular:  [x] Pain in legs with walking   [] Pain in legs at rest   [] Pain in legs when laying flat   [x] Claudication   [] Pain in feet when walking  [] Pain in feet at rest  [] Pain in feet when laying flat   [] History of DVT   [] Phlebitis   [] Swelling in legs   [] Varicose veins   [] Non-healing ulcers Pulmonary:   [] Uses home oxygen   [] Productive cough   [] Hemoptysis    [] Wheeze  [] COPD   [] Asthma Neurologic:  [] Dizziness  [] Blackouts   [] Seizures   [] History of stroke   [] History of TIA  [] Aphasia   [] Temporary blindness   [] Dysphagia   [] Weakness or numbness in arms   [] Weakness or numbness in legs Musculoskeletal:  [] Arthritis   [] Joint swelling   [] Joint pain   [] Low back pain Hematologic:  [] Easy bruising  [] Easy bleeding   [] Hypercoagulable state   [] Anemic  [] Hepatitis Gastrointestinal:  [] Blood in stool   [] Vomiting blood  [] Gastroesophageal reflux/heartburn   [] Abdominal pain Genitourinary:  [] Chronic kidney disease   [] Difficult urination  [] Frequent urination  [] Burning with urination   [] Hematuria Skin:  [] Rashes   [] Ulcers   [] Wounds Psychological:  [] History of anxiety   []  History of major depression.    Physical Exam BP 123/80   Pulse 78   Resp 16   Ht 6\' 2"  (1.88 m)   Wt 215 lb 9.6 oz (97.8 kg)   BMI 27.68 kg/m  Gen:  WD/WN, NAD. Appears younger than stated age Head: [place]/AT, No temporalis wasting. Prominent temp pulse not noted. Ear/Nose/Throat: Hearing grossly intact, nares w/o erythema or drainage, oropharynx w/o Erythema/Exudate Eyes: Conjunctiva clear, sclera non-icteric  Neck: trachea midline.  No JVD.  Pulmonary:  Good air movement, equal bilaterally.  Cardiac: RRR, normal S1, S2 Vascular:  Vessel Right Left  Radial Palpable Palpable  Ulnar Palpable Palpable  Brachial Palpable Palpable      Aorta Palpable N/A  Femoral Palpable Palpable  Popliteal Not Palpable Not Palpable  PT Trace Palpable 1+ Palpable  DP 1+ Palpable Trace Palpable   Gastrointestinal: soft, non-tender/non-distended. No guarding/reflex. No masses, surgical incisions, or scars.  Aortic impulse enlarged Musculoskeletal: M/S 5/5 throughout.  Extremities without ischemic changes.  No deformity or atrophy.  Neurologic: Sensation grossly intact in extremities.  Symmetrical.  Speech is fluent. Motor exam as listed above. Psychiatric: Judgment intact,  Mood & affect appropriate for pt's clinical situation. Dermatologic: No rashes or ulcers noted.  No cellulitis or open wounds. Lymph : No Cervical, Axillary, or Inguinal lymphadenopathy.   Radiology Ct Angio Abdomen Pelvis  W &/or Wo Contrast  Result Date: 12/29/2015 CLINICAL DATA:  75 year old with abdominal aortic aneurysm without rupture. Aching in legs. Current smoker. EXAM: CT ANGIOGRAPHY ABDOMEN AND PELVIS WITH CONTRAST CONTRAST TECHNIQUE: Multidetector CT imaging of the abdomen and pelvis was performed using the standard protocol during bolus administration of intravenous contrast. Multiplanar reconstructed images and MIPs were obtained and reviewed to evaluate the vascular anatomy. CONTRAST:  100 mL Isovue 370 COMPARISON:  Chest CT 12/05/2014 FINDINGS: VASCULAR Aorta: Infrarenal abdominal aortic aneurysm containing a large amount of mural thrombus. Aneurysm measures 6.3 x 5.2 cm  on sequence 5, image 15. No evidence for an aortic rupture. Aneurysm extends into the common iliac arteries bilaterally. Infrarenal abdominal aorta is mildly tortuous. Celiac: Patent without evidence of aneurysm, dissection, vasculitis or significant stenosis. SMA: Patent without evidence of aneurysm, dissection, vasculitis or significant stenosis. Renals: Single bilateral renal arteries. Bilateral renal arteries are patent without significant atherosclerotic disease or stenosis. IMA: IMA is patent but there is likely stenosis near the origin. In addition, there may be plaque in the main IMA. Inflow: Aneurysm of the common iliac arteries bilaterally. Fusiform aneurysm throughout the right common iliac artery with a maximum diameter of roughly 3.9 cm. Aneurysm of the proximal right internal iliac artery measuring at least 2.0 cm. Large amount of mural thrombus in the right common iliac artery. Right external iliac artery makes a sharp turn just beyond the right iliac artery bifurcation. Fusiform aneurysm of the left common  iliac artery measuring up to 3.4 cm. Dilatation of the proximal left internal iliac artery measuring 1.6 cm. Left external iliac artery has mild atherosclerotic disease without significant narrowing. Proximal Outflow: Aneurysm of the right common femoral artery measuring up to 3.2 cm. The proximal right SFA is occluded. There is stenosis involving a right deep femoral artery branch. Aneurysm of the left common iliac artery measuring up to 2.1 cm. Left profunda femoral arteries are patent. Left SFA occludes just beyond the origin. Veins: No obvious venous abnormality within the limitations of this arterial phase study. Review of the MIP images confirms the above findings. NON-VASCULAR Lower chest: Lung bases are clear. Hepatobiliary: Small low-density structures in the liver are suggestive for cysts. Normal appearance of the gallbladder and the portal venous system is patent. No significant biliary dilatation. Pancreas: Normal appearance of the pancreas without inflammation or duct dilatation. Spleen: Normal appearance of spleen without enlargement. Adrenals/Urinary Tract: Mild nodularity involving bilateral adrenal glands. This area was incompletely evaluated on the previous chest CT. Findings could be related to hyperplasia and small adenomas but indeterminate. Bilateral renal cysts. No hydronephrosis. Urinary bladder is unremarkable. Stomach/Bowel: Few colonic diverticula without inflammatory changes. No evidence for bowel dilatation, obstruction or bowel inflammation. Normal appearance of the appendix. Lymphatic: There is no significant lymph node enlargement in the abdomen or pelvis. Reproductive: Prostate is prominent for size measuring 6.2 cm in the transverse dimension. Other: No free fluid. No free air. Tiny umbilical hernia containing fat. Musculoskeletal: No acute abnormality. Mild disc space narrowing at L4-L5. IMPRESSION: VASCULAR Infrarenal abdominal aortic aneurysm measuring up to 6.3 cm. Negative for  an aortic rupture. Aneurysm of the bilateral common iliac arteries. Right common iliac artery measures up to 3.9 cm and left common iliac artery measures up to 3.4 cm. Aneurysm of the internal iliac arteries bilaterally as described. Aneurysm of the common femoral arteries, right side greater than left. Right common femoral artery aneurysm measures up to 3.2 cm and the left common femoral artery measures 2.1 cm. Occlusion of the proximal superficial femoral arteries bilaterally. This is likely contributing to the patient's leg symptoms. NON-VASCULAR No acute abnormality in the abdomen or pelvis. Bilateral renal cysts. Indeterminate nodularity in bilateral adrenal glands. Findings are indeterminate but could be related to adrenal adenomas. Presumably, the patient will be getting follow-up imaging for the vascular aneurysms and recommend attention to this area on follow up imaging. Alternatively, this could be further characterized with MRI or noncontrast abdominal CT. Prostate hypertrophy. Electronically Signed   By: Richarda OverlieAdam  Henn M.D.   On: 12/29/2015 14:47  Assessment/Plan  Essential hypertension, benign blood pressure control important in reducing the progression of atherosclerotic disease. On appropriate oral medications.   Tobacco abuse Cessation recommended.  This is a risk factor for AAA.  Hyperlipidemia lipid control important in reducing the progression of atherosclerotic disease. Continue statin therapy   AAA (abdominal aortic aneurysm) without rupture (HCC) I have had a long discussion today with the patient regarding his CT scan findings. He has a complex aneurysmal disease that will require a multistage repair. He can be repaired in an endovascular fashion, but this will require preliminary quill embolization of the right hypogastric artery. Traditionally, this would require staged coil embolizations of both hypogastric arteries, but with the branch device now available from [employer], I  believe we can preserve his left hypogastric artery flow and still get a satisfactory repair. He is very desirous of this as he does not want the almost certain sexual side effects of bilateral coil embolizations or open surgery. We discussed sexual side effects were still common, but we would do our best to preserve pelvic flow. I have drawn pictures and discussed the repair in detail. He voices his understanding and is agreeable to proceed with this plan. This would be a right hypogastric artery coil embolization followed 1-2 weeks later by abdominal aortic aneurysm repair with a branched endoprosthesis in his left iliac system.    Festus BarrenJason Dew, MD  01/01/2016 10:09 AM    This note was created with Dragon medical transcription system.  Any errors from dictation are purely unintentional

## 2016-01-01 NOTE — Patient Instructions (Signed)
Abdominal Aortic Aneurysm Endograft Repair Abdominal aortic aneurysm endograft repair is a surgery to fix an aortic aneurysm in the abdominal area. An aneurysm is a weak or damaged part of an artery wall that bulges out from the normal force of blood pumping through the body. An abdominal aortic aneurysm is an aneurysm that happens in the lower part of the aorta, which is the main artery of the body. The repair is often done if the aneurysm gets so large that it might burst (rupture). A ruptured aneurysm would cause bleeding inside the body that could put a person's life in danger. Before that happens, this procedure is needed to fix the problem. The procedure may also be done if the aneurysm causes symptoms such as pain in the back, abdomen, or side. In this procedure, a tube made of fabric and metal mesh (endograft or stent-graft) is placed in the weak part of the aorta to repair it. Tell a health care provider about:  Any allergies you have.  All medicines you are taking, including vitamins, herbs, eye drops, creams, and over-the-counter medicines.  Any problems you or family members have had with anesthetic medicines.  Any blood disorders you have.  Any surgeries you have had.  Any medical conditions you have.  Whether you are pregnant or may be pregnant. What are the risks? Generally, this is a safe procedure. However, problems may occur, including:  Infection of the graft or incision area.  Bleeding during the procedure or from the incision site.  Allergic reactions to medicines.  Damage to other structures or organs.  Blood leaking out around the endograft.  The endograft moving from where it was placed during surgery.  Blood flow through the graft becoming blocked.  Blood clots.  Kidney problems.  Blood flow to the legs becoming blocked (rare).  Rupture of the aorta even after the endograft repair is a success (rare). What happens before the procedure? Staying  hydrated  Follow instructions from your health care provider about hydration, which may include:  Up to 2 hours before the procedure - you may continue to drink clear liquids, such as water, clear fruit juice, black coffee, and plain tea. Eating and drinking restrictions  Follow instructions from your health care provider about eating and drinking, which may include:  8 hours before the procedure - stop eating heavy meals or foods such as meat, fried foods, or fatty foods.  6 hours before the procedure - stop eating light meals or foods, such as toast or cereal.  6 hours before the procedure - stop drinking milk or drinks that contain milk.  2 hours before the procedure - stop drinking clear liquids. Medicines  Ask your health care provider about:  Changing or stopping your regular medicines. This is especially important if you are taking diabetes medicines or blood thinners.  Taking medicines such as aspirin and ibuprofen. These medicines can thin your blood. Do not take these medicines before your procedure if your health care provider instructs you not to.  You may be given antibiotic medicine to help prevent infection. General instructions  You may need to have blood tests, a test to check heart rhythm (electrocardiogram, or ECG), or a test to check blood flow (angiogram) before the surgery.  Imaging tests will be done to check the size and location of the aneurysm. These tests could include an ultrasound, a CT scan, or an MRI.  Do not use any products that contain nicotine or tobacco-such as cigarettes and e-cigarettes-for   as long as possible before the surgery. If you need help quitting, ask your health care provider.  Ask your health care provider how your surgical site will be marked or identified.  Plan to have someone take you home from the hospital or clinic. What happens during the procedure?  To reduce your risk of infection:  Your health care team will wash or  sanitize their hands.  Your skin will be washed with soap.  Hair may be removed from the surgical area.  An IV tube will be inserted into one of your veins.  You will be given one or more of the following:  A medicine to help you relax (sedative).  A medicine to numb the area (local anesthetic).  A medicine to make you fall asleep (general anesthetic).  A medicine that is injected into an area of your body to numb everything below the injection site (regional anesthetic).  During the surgery:  Small incisions or a puncture will be made on one or both sides of the groin. Long, thin tubes (catheters) will be passed through the opening, put into the artery in your thigh, and moved up into the aneurysm in the aorta.  The health care provider will use live X-ray pictures to guide the endograft through the catheterto the place where the aneurysm is.  The endograft will be released to seal off the aneurysm and to line the aorta. It will keep blood from flowing into the aneurysm and will help keep it from rupturing. The endograft will stay in place and will not be taken out.  X-rays will be used to check where the endograft is placed and to make sure that it is where it should be.  The catheter will be taken out, and the incision will be closed with stitches (sutures). The procedure may vary among health care providers and hospitals. What happens after the procedure?  Your blood pressure, heart rate, breathing rate, and blood oxygen level will be monitored until the medicines you were given have worn off.  You will need to lie flat for a number of hours. Bending your legs can cause them to bleed and swell.  You will then be urged to get up and move around a number of times each day and to slowly become more active.  You will be given medicines to control pain.  Certain tests may be done after your procedure to check how well the endograft is working and to check its placement.  Do  not drive for 24 hours if you received a sedative. This information is not intended to replace advice given to you by your health care provider. Make sure you discuss any questions you have with your health care provider. Document Released: 05/15/2008 Document Revised: 07/17/2015 Document Reviewed: 03/23/2015 Elsevier Interactive Patient Education  2017 [employer]  

## 2016-01-01 NOTE — Assessment & Plan Note (Signed)
Cessation recommended.  This is a risk factor for AAA.

## 2016-01-01 NOTE — Telephone Encounter (Signed)
This encounter was created in error - please disregard.

## 2016-01-01 NOTE — Assessment & Plan Note (Signed)
I have had a long discussion today with the patient regarding his CT scan findings. He has a complex aneurysmal disease that will require a multistage repair. He can be repaired in an endovascular fashion, but this will require preliminary quill embolization of the right hypogastric artery. Traditionally, this would require staged coil embolizations of both hypogastric arteries, but with the branch device now available from [employer], I believe we can preserve his left hypogastric artery flow and still get a satisfactory repair. He is very desirous of this as he does not want the almost certain sexual side effects of bilateral coil embolizations or open surgery. We discussed sexual side effects were still common, but we would do our best to preserve pelvic flow. I have drawn pictures and discussed the repair in detail. He voices his understanding and is agreeable to proceed with this plan. This would be a right hypogastric artery coil embolization followed 1-2 weeks later by abdominal aortic aneurysm repair with a branched endoprosthesis in his left iliac system.

## 2016-01-01 NOTE — Assessment & Plan Note (Signed)
blood pressure control important in reducing the progression of atherosclerotic disease. On appropriate oral medications.  

## 2016-01-01 NOTE — Assessment & Plan Note (Signed)
lipid control important in reducing the progression of atherosclerotic disease. Continue statin therapy  

## 2016-01-01 NOTE — Telephone Encounter (Signed)
Surgical clearance request from [facility] for Jan 8 AAA repair placed in MD basket.

## 2016-01-12 ENCOUNTER — Encounter (INDEPENDENT_AMBULATORY_CARE_PROVIDER_SITE_OTHER): Payer: Self-pay

## 2016-01-12 NOTE — Telephone Encounter (Signed)
Clearance for AAA repair faxed to [facility], [phone]. Pt notified to hold plavix 7 days prior to procedure.

## 2016-01-13 ENCOUNTER — Other Ambulatory Visit (INDEPENDENT_AMBULATORY_CARE_PROVIDER_SITE_OTHER): Payer: Self-pay | Admitting: Vascular Surgery

## 2016-01-14 ENCOUNTER — Inpatient Hospital Stay: Admission: RE | Admit: 2016-01-14 | Payer: Medicare Other | Source: Ambulatory Visit

## 2016-01-15 ENCOUNTER — Telehealth: Payer: Self-pay | Admitting: Cardiovascular Disease

## 2016-01-15 ENCOUNTER — Encounter
Admission: RE | Admit: 2016-01-15 | Discharge: 2016-01-15 | Disposition: A | Discharge status: Home / Self Care | Payer: Medicare Other | Source: Ambulatory Visit | Attending: Vascular Surgery | Admitting: Vascular Surgery

## 2016-01-15 DIAGNOSIS — I252 Old myocardial infarction: Secondary | ICD-10-CM | POA: Diagnosis not present

## 2016-01-15 DIAGNOSIS — Z8249 Family history of ischemic heart disease and other diseases of the circulatory system: Secondary | ICD-10-CM | POA: Diagnosis not present

## 2016-01-15 DIAGNOSIS — I723 Aneurysm of iliac artery: Secondary | ICD-10-CM | POA: Diagnosis present

## 2016-01-15 DIAGNOSIS — I2510 Atherosclerotic heart disease of native coronary artery without angina pectoris: Secondary | ICD-10-CM | POA: Diagnosis not present

## 2016-01-15 DIAGNOSIS — F17210 Nicotine dependence, cigarettes, uncomplicated: Secondary | ICD-10-CM | POA: Diagnosis not present

## 2016-01-15 DIAGNOSIS — Z823 Family history of stroke: Secondary | ICD-10-CM | POA: Diagnosis not present

## 2016-01-15 DIAGNOSIS — J449 Chronic obstructive pulmonary disease, unspecified: Secondary | ICD-10-CM | POA: Diagnosis not present

## 2016-01-15 DIAGNOSIS — Z955 Presence of coronary angioplasty implant and graft: Secondary | ICD-10-CM | POA: Diagnosis not present

## 2016-01-15 DIAGNOSIS — E785 Hyperlipidemia, unspecified: Secondary | ICD-10-CM | POA: Diagnosis not present

## 2016-01-15 DIAGNOSIS — Z7902 Long term (current) use of antithrombotics/antiplatelets: Secondary | ICD-10-CM | POA: Diagnosis not present

## 2016-01-15 DIAGNOSIS — Z7982 Long term (current) use of aspirin: Secondary | ICD-10-CM | POA: Diagnosis not present

## 2016-01-15 DIAGNOSIS — I714 Abdominal aortic aneurysm, without rupture: Secondary | ICD-10-CM | POA: Diagnosis present

## 2016-01-15 DIAGNOSIS — I10 Essential (primary) hypertension: Secondary | ICD-10-CM | POA: Diagnosis not present

## 2016-01-15 HISTORY — DX: Peripheral vascular disease, unspecified: I73.9

## 2016-01-15 HISTORY — DX: Cough, unspecified: R05.9

## 2016-01-15 HISTORY — DX: Unspecified asthma, uncomplicated: J45.909

## 2016-01-15 HISTORY — DX: Dyspnea, unspecified: R06.00

## 2016-01-15 HISTORY — DX: Cough: R05

## 2016-01-15 LAB — CBC WITH DIFFERENTIAL/PLATELET
BASOS ABS: 0.1 10*3/uL (ref 0–0.1)
BASOS PCT: 1 %
EOS ABS: 0.1 10*3/uL (ref 0–0.7)
EOS PCT: 1 %
HCT: 42.1 % (ref 40.0–52.0)
Hemoglobin: 13.3 g/dL (ref 13.0–18.0)
Lymphocytes Relative: 30 %
Lymphs Abs: 2.3 10*3/uL (ref 1.0–3.6)
MCH: 26.5 pg (ref 26.0–34.0)
MCHC: 31.7 g/dL — ABNORMAL LOW (ref 32.0–36.0)
MCV: 83.8 fL (ref 80.0–100.0)
MONO ABS: 0.9 10*3/uL (ref 0.2–1.0)
Monocytes Relative: 12 %
Neutro Abs: 4.2 10*3/uL (ref 1.4–6.5)
Neutrophils Relative %: 56 %
PLATELETS: 251 10*3/uL (ref 150–440)
RBC: 5.03 MIL/uL (ref 4.40–5.90)
RDW: 16.7 % — AB (ref 11.5–14.5)
WBC: 7.5 10*3/uL (ref 3.8–10.6)

## 2016-01-15 LAB — BASIC METABOLIC PANEL
Anion gap: 6 (ref 5–15)
BUN: 15 mg/dL (ref 6–20)
CALCIUM: 9.1 mg/dL (ref 8.9–10.3)
CO2: 34 mmol/L — ABNORMAL HIGH (ref 22–32)
CREATININE: 1.06 mg/dL (ref 0.61–1.24)
Chloride: 95 mmol/L — ABNORMAL LOW (ref 101–111)
GFR calc Af Amer: >60 mL/min (ref >60)
Glucose, Bld: 94 mg/dL (ref 65–99)
POTASSIUM: 3.1 mmol/L — AB (ref 3.5–5.1)
SODIUM: 135 mmol/L (ref 135–145)

## 2016-01-15 LAB — TYPE AND SCREEN
ABO/RH(D): A POS
ANTIBODY SCREEN: NEGATIVE

## 2016-01-15 LAB — PROTIME-INR
INR: 1.08
PROTHROMBIN TIME: 14 s (ref 11.4–15.2)

## 2016-01-15 LAB — APTT: APTT: 33 s (ref 24–36)

## 2016-01-15 NOTE — Pre-Procedure Instructions (Addendum)
PATIENT LEFT WITHOUT INSTRUCTION SHEET FOR 01/20/16 ENDO VAS AAA. SENT WITH CHART TO SPECIALS FOR 01/18/16 PROCEDURE. PATIENT HAS COUGH,DENIES WHEEZING,FEVER,STATES NONPRODUCTIVE. STATES PCP WOULD NOT REFILL INHALER. PCP OFFICE CLOSED. PAGED PCP TO SEE IF HE WILL REFILL INHALER AT [facility] [address] TO USE UNTIL AFTER SURGERY  FAXED REQUEST AS INSTRUCTED BY DR Karlton LemonKARENZ TO DR Kirke CorinARIDA FOR CARDIAC CLEARANCE NOTE

## 2016-01-15 NOTE — Patient Instructions (Signed)
  Your procedure is scheduled on: 01/20/16 Report to Day Surgery. [facility]OND FLOOR To find out your arrival time please call [phone] between 1PM - 3PM on 01/19/16.  Remember: Instructions that are not followed completely may result in serious medical risk, up to and including death, or upon the discretion of your surgeon and anesthesiologist your surgery may need to be rescheduled.    __X__ 1. Do not eat food or drink liquids after midnight. No gum chewing or hard candies.     ____ 2. No Alcohol for 24 hours before or after surgery.   ___X_ 3. Do Not Smoke For 24 Hours Prior to Your Surgery.   ____ 4. Bring all medications with you on the day of surgery if instructed.    ____ 5. Notify your doctor if there is any change in your medical condition     (cold, fever, infections).       Do not wear jewelry, make-up, hairpins, clips or nail polish.  Do not wear lotions, powders, or perfumes. You may wear deodorant.  Do not shave 48 hours prior to surgery. Men may shave face and neck.  Do not bring valuables to the hospital.    [facility] not responsible for any belongings or valuables.               Contacts, dentures or bridgework may not be worn into surgery.  Leave your suitcase in the car. After surgery it may be brought to your room.  For patients admitted to the hospital, discharge time is determined by your                treatment team.   Patients discharged the day of surgery will not be allowed to drive home.   Please read over the following fact sheets that you were given:   MRSA Information   _X__ Take these medicines the morning of surgery with A SIP OF WATER:    1. METOPROLOL  2.   3.   4.  5.  6.  ____ Fleet Enema (as directed)   __X__ Use CHG Soap as directed  _X___ Use inhalers on the day of surgery   USE INHALER DAILY UNTIL AFTER SURGERY 01/20/16 IF RESTARTED BY DR EASON  ____ Stop metformin 2 days prior to surgery    ____ Take 1/2 of  usual insulin dose the night before surgery and none on the morning of surgery.   __X__ Stop Coumadin/Plavix/aspirin on       PLAVIX ALREADY STOPPED  ____ Stop Anti-inflammatories on    ____ Stop supplements until after surgery.    ____ Bring C-Pap to the hospital.

## 2016-01-15 NOTE — Pre-Procedure Instructions (Addendum)
CLEARED BY DR Kirke CorinARIDA 01/05/16  LAURA AT DR DEW'S NOTIFIED KT 3.1 AND NEEDS SUPP STARTED. LAURA CALLED BACK AND STATEDS DR Wyn QuakerEW SAYS KT WILL INCREASE AFTER PROCEDURE 01/18/16. WILL RECHECK KT AM SURGERY

## 2016-01-15 NOTE — Telephone Encounter (Signed)
[facility] for 1/8 AAA repair Advised Sherry that clearance was faxed to [facility]. Faxed copy to PAT

## 2016-01-17 MED ORDER — DEXTROSE 5 % IV SOLN: 1.5000 g | INTRAVENOUS | Status: DC

## 2016-01-18 ENCOUNTER — Encounter: Payer: Self-pay | Admitting: Anesthesiology

## 2016-01-18 ENCOUNTER — Encounter
Admission: RE | Disposition: A | Discharge status: Home / Self Care | Payer: Self-pay | Source: Ambulatory Visit | Attending: Vascular Surgery

## 2016-01-18 ENCOUNTER — Ambulatory Visit: Payer: Medicare Other | Admitting: Anesthesiology

## 2016-01-18 ENCOUNTER — Ambulatory Visit
Admission: RE | Admit: 2016-01-18 | Discharge: 2016-01-18 | Disposition: A | Discharge status: Home / Self Care | Payer: Medicare Other | Source: Ambulatory Visit | Attending: Vascular Surgery | Admitting: Vascular Surgery

## 2016-01-18 DIAGNOSIS — Z823 Family history of stroke: Secondary | ICD-10-CM | POA: Insufficient documentation

## 2016-01-18 DIAGNOSIS — Z8249 Family history of ischemic heart disease and other diseases of the circulatory system: Secondary | ICD-10-CM | POA: Insufficient documentation

## 2016-01-18 DIAGNOSIS — I252 Old myocardial infarction: Secondary | ICD-10-CM | POA: Insufficient documentation

## 2016-01-18 DIAGNOSIS — I714 Abdominal aortic aneurysm, without rupture: Secondary | ICD-10-CM | POA: Insufficient documentation

## 2016-01-18 DIAGNOSIS — I2510 Atherosclerotic heart disease of native coronary artery without angina pectoris: Secondary | ICD-10-CM | POA: Insufficient documentation

## 2016-01-18 DIAGNOSIS — I10 Essential (primary) hypertension: Secondary | ICD-10-CM | POA: Insufficient documentation

## 2016-01-18 DIAGNOSIS — I728 Aneurysm of other specified arteries: Secondary | ICD-10-CM | POA: Diagnosis not present

## 2016-01-18 DIAGNOSIS — J449 Chronic obstructive pulmonary disease, unspecified: Secondary | ICD-10-CM | POA: Insufficient documentation

## 2016-01-18 DIAGNOSIS — I723 Aneurysm of iliac artery: Secondary | ICD-10-CM | POA: Insufficient documentation

## 2016-01-18 DIAGNOSIS — Z7902 Long term (current) use of antithrombotics/antiplatelets: Secondary | ICD-10-CM | POA: Insufficient documentation

## 2016-01-18 DIAGNOSIS — Z955 Presence of coronary angioplasty implant and graft: Secondary | ICD-10-CM | POA: Insufficient documentation

## 2016-01-18 DIAGNOSIS — F17210 Nicotine dependence, cigarettes, uncomplicated: Secondary | ICD-10-CM | POA: Insufficient documentation

## 2016-01-18 DIAGNOSIS — Z7982 Long term (current) use of aspirin: Secondary | ICD-10-CM | POA: Insufficient documentation

## 2016-01-18 DIAGNOSIS — E785 Hyperlipidemia, unspecified: Secondary | ICD-10-CM | POA: Insufficient documentation

## 2016-01-18 HISTORY — PX: PERIPHERAL VASCULAR CATHETERIZATION: SHX172C

## 2016-01-18 SURGERY — EMBOLIZATION
Anesthesia: General | Laterality: Right

## 2016-01-18 MED ORDER — NITROGLYCERIN 5 MG/ML IV SOLN
INTRAVENOUS | Status: AC
  Filled 2016-01-18: qty 10

## 2016-01-18 MED ORDER — HYDROMORPHONE HCL 1 MG/ML IJ SOLN: 1.0000 mg | Freq: Once | INTRAMUSCULAR | Status: DC

## 2016-01-18 MED ORDER — METOPROLOL TARTRATE 5 MG/5ML IV SOLN: 2.0000 mg | INTRAVENOUS | Status: DC | PRN

## 2016-01-18 MED ORDER — ACETAMINOPHEN 325 MG RE SUPP
325.0000 mg | RECTAL | Status: DC | PRN
  Filled 2016-01-18: qty 2

## 2016-01-18 MED ORDER — IPRATROPIUM-ALBUTEROL 0.5-2.5 (3) MG/3ML IN SOLN
RESPIRATORY_TRACT | Status: AC
  Filled 2016-01-18: qty 3

## 2016-01-18 MED ORDER — HEPARIN SODIUM (PORCINE) 1000 UNIT/ML IJ SOLN
INTRAMUSCULAR | Status: DC | PRN
  Administered 2016-01-18: 4000 [IU] via INTRAVENOUS

## 2016-01-18 MED ORDER — LIDOCAINE HCL (PF) 1 % IJ SOLN
INTRAMUSCULAR | Status: AC
  Filled 2016-01-18: qty 10

## 2016-01-18 MED ORDER — SODIUM CHLORIDE 0.9 % IV SOLN: 500.0000 mL | Freq: Once | INTRAVENOUS | Status: DC | PRN

## 2016-01-18 MED ORDER — HYDROMORPHONE HCL 1 MG/ML IJ SOLN: 0.5000 mg | INTRAMUSCULAR | Status: DC | PRN

## 2016-01-18 MED ORDER — FENTANYL CITRATE (PF) 100 MCG/2ML IJ SOLN
INTRAMUSCULAR | Status: DC | PRN
  Administered 2016-01-18 (×2): 25 ug via INTRAVENOUS

## 2016-01-18 MED ORDER — METHYLPREDNISOLONE SODIUM SUCC 125 MG IJ SOLR
INTRAMUSCULAR | Status: AC
  Administered 2016-01-18: 125 mg via INTRAVENOUS
  Filled 2016-01-18: qty 2

## 2016-01-18 MED ORDER — ACETAMINOPHEN 325 MG PO TABS: 325.0000 mg | ORAL_TABLET | ORAL | Status: DC | PRN

## 2016-01-18 MED ORDER — FENTANYL CITRATE (PF) 100 MCG/2ML IJ SOLN
INTRAMUSCULAR | Status: AC
  Filled 2016-01-18: qty 2

## 2016-01-18 MED ORDER — SODIUM CHLORIDE 0.9 % IJ SOLN
INTRAMUSCULAR | Status: AC
  Filled 2016-01-18: qty 50

## 2016-01-18 MED ORDER — HEPARIN SODIUM (PORCINE) 1000 UNIT/ML IJ SOLN
INTRAMUSCULAR | Status: AC
  Filled 2016-01-18: qty 1

## 2016-01-18 MED ORDER — HEPARIN (PORCINE) IN NACL 2-0.9 UNIT/ML-% IJ SOLN
INTRAMUSCULAR | Status: AC
  Filled 2016-01-18: qty 1000

## 2016-01-18 MED ORDER — LABETALOL HCL 5 MG/ML IV SOLN: 10.0000 mg | INTRAVENOUS | Status: DC | PRN

## 2016-01-18 MED ORDER — FAMOTIDINE 20 MG PO TABS: 40.0000 mg | ORAL_TABLET | ORAL | Status: DC | PRN

## 2016-01-18 MED ORDER — HYDRALAZINE HCL 20 MG/ML IJ SOLN: 5.0000 mg | INTRAMUSCULAR | Status: DC | PRN

## 2016-01-18 MED ORDER — GUAIFENESIN-DM 100-10 MG/5ML PO SYRP: 15.0000 mL | ORAL_SOLUTION | ORAL | Status: DC | PRN

## 2016-01-18 MED ORDER — MIDAZOLAM HCL 2 MG/2ML IJ SOLN
INTRAMUSCULAR | Status: DC | PRN
  Administered 2016-01-18 (×2): 0.5 mg via INTRAVENOUS

## 2016-01-18 MED ORDER — CEFAZOLIN IN D5W 1 GM/50ML IV SOLN
1.0000 g | Freq: Once | INTRAVENOUS | Status: AC
  Administered 2016-01-18: 1 g via INTRAVENOUS

## 2016-01-18 MED ORDER — ONDANSETRON HCL 4 MG/2ML IJ SOLN: 4.0000 mg | Freq: Four times a day (QID) | INTRAMUSCULAR | Status: DC | PRN

## 2016-01-18 MED ORDER — MIDAZOLAM HCL 2 MG/2ML IJ SOLN
INTRAMUSCULAR | Status: AC
Start: 2016-01-18 — End: 2016-01-18
  Filled 2016-01-18: qty 2

## 2016-01-18 MED ORDER — PHENOL 1.4 % MT LIQD: 1.0000 | OROMUCOSAL | Status: DC | PRN

## 2016-01-18 MED ORDER — CEFAZOLIN IN D5W 1 GM/50ML IV SOLN
INTRAVENOUS | Status: AC
  Filled 2016-01-18: qty 50

## 2016-01-18 MED ORDER — IOPAMIDOL (ISOVUE-300) INJECTION 61%
INTRAVENOUS | Status: DC | PRN
  Administered 2016-01-18: 50 mL via INTRAVENOUS

## 2016-01-18 MED ORDER — SODIUM CHLORIDE 0.9 % IV SOLN
INTRAVENOUS | Status: DC
  Administered 2016-01-18: 08:00:00 via INTRAVENOUS

## 2016-01-18 MED ORDER — OXYCODONE-ACETAMINOPHEN 5-325 MG PO TABS: 1.0000 | ORAL_TABLET | ORAL | Status: DC | PRN

## 2016-01-18 MED ORDER — METHYLPREDNISOLONE SODIUM SUCC 125 MG IJ SOLR
125.0000 mg | Freq: Once | INTRAMUSCULAR | Status: AC
  Administered 2016-01-18: 125 mg via INTRAVENOUS

## 2016-01-18 MED ORDER — METHYLPREDNISOLONE SODIUM SUCC 125 MG IJ SOLR: 125.0000 mg | INTRAMUSCULAR | Status: DC | PRN

## 2016-01-18 MED ORDER — IPRATROPIUM-ALBUTEROL 0.5-2.5 (3) MG/3ML IN SOLN
3.0000 mL | Freq: Once | RESPIRATORY_TRACT | Status: AC
  Administered 2016-01-18: 3 mL via RESPIRATORY_TRACT

## 2016-01-18 SURGICAL SUPPLY — 17 items
BAG DECANTER STRL (MISCELLANEOUS) ×3 IMPLANT
CATH 5FR REUT (CATHETERS) ×3 IMPLANT
CATH ACCU-VU SIZ PIG 5F 70CM (CATHETERS) ×3 IMPLANT
CATH BEACON 5.038 65CM KMP-01 (CATHETERS) ×3 IMPLANT
CATH RIM 65CM (CATHETERS) ×3 IMPLANT
COIL EMBL 14X103.2 LOOP (Vascular Products) ×3 IMPLANT
COIL MREYE 5X8 (Vascular Products) ×6 IMPLANT
COIL NESTER 14X10 (Vascular Products) ×6 IMPLANT
DEVICE PRESTO INFLATION (MISCELLANEOUS) ×3 IMPLANT
DEVICE STARCLOSE SE CLOSURE (Vascular Products) ×3 IMPLANT
GLIDEWIRE ADV .035X260CM (WIRE) ×3 IMPLANT
PACK ANGIOGRAPHY (CUSTOM PROCEDURE TRAY) ×3 IMPLANT
SHEATH ANL2 6FRX45 HC (SHEATH) ×3 IMPLANT
SHEATH BRITE TIP 5FRX11 (SHEATH) ×3 IMPLANT
SYR MEDRAD MARK V 150ML (SYRINGE) ×3 IMPLANT
TUBING CONTRAST HIGH PRESS 72 (TUBING) ×3 IMPLANT
WIRE J 3MM .035X145CM (WIRE) ×3 IMPLANT

## 2016-01-18 NOTE — Discharge Instructions (Signed)

## 2016-01-18 NOTE — Anesthesia Preprocedure Evaluation (Addendum)
Anesthesia Evaluation  Patient identified by MRN, date of birth, ID band Patient awake    Reviewed: Allergy & Precautions, H&P , NPO status , Patient's Chart, lab work & pertinent test results  History of Anesthesia Complications Negative for: history of anesthetic complications  Airway Mallampati: III  TM Distance: >3 FB Neck ROM: limited    Dental no notable dental hx. (+) Poor Dentition, Chipped, Missing, Caps   Pulmonary shortness of breath and with exertion, asthma , COPD,  COPD inhaler, Current Smoker,    + rhonchi  + decreased breath sounds+ wheezing      Cardiovascular Exercise Tolerance: Poor hypertension, (-) angina+ CAD, + Past MI, + Cardiac Stents and + Peripheral Vascular Disease  (-) DOE Normal cardiovascular exam(-) dysrhythmias  Rhythm:regular Rate:Normal     Neuro/Psych  Neuromuscular disease negative psych ROS   GI/Hepatic negative GI ROS, Neg liver ROS, neg GERD  ,  Endo/Other  negative endocrine ROS  Renal/GU      Musculoskeletal   Abdominal   Peds  Hematology negative hematology ROS (+)   Anesthesia Other Findings Past Medical History: No date: Asthma No date: CAD (coronary artery disease)     Comment: a. 2002 Cath: Nonobs dzs per patient;  b.               11/2014 NSTEMI/PCI: LM nl, LAD 30p, 60p, 80d,               D1/2/3 min irregs, RI min irregs, LCX, 60m, OM1              min irregs, OM2 nl, OM3 50ost, RCA 40p, 100d               (2.25x18 Xience Alpine DES), EF 55-65%; c.               01/2015 MV: nl EF, no ischemia. No date: COPD (chronic obstructive pulmonary disease) (* No date: Cough     Comment: SMOKER. DENIES FEVER/WHEEZING/ COUGH               NONPRODUCTIVE No date: Dyspnea     Comment: chronic No date: Hyperlipidemia No date: Hypertension No date: Hypertensive heart disease No date: Myocardial infarction     Comment: 2016 No date: Peripheral vascular disease (HCC) No  date: Tobacco abuse No date: Tobacco abuse  Past Surgical History: No date: CARDIAC CATHETERIZATION 12/08/2014: CARDIAC CATHETERIZATION Bilateral     Comment: Procedure: Left Heart Cath and Coronary               Angiography;  Surgeon: Iran Ouch, MD;                Location: [facility] INVASIVE CV LAB;  Service:               Cardiovascular;  Laterality: Bilateral; 12/08/2014: CARDIAC CATHETERIZATION N/A     Comment: Procedure: Coronary Stent Intervention;                Surgeon: Iran Ouch, MD;  Location: [facility]               INVASIVE CV LAB;  Service: Cardiovascular;                Laterality: N/A; No date: CORONARY ANGIOPLASTY     Comment: stent 2016 No date: SHOULDER SURGERY Left  BMI    Body Mass Index:  27.86 kg/m      Reproductive/Obstetrics negative OB ROS  Anesthesia Physical Anesthesia Plan  ASA: III  Anesthesia Plan: General ETT   Post-op Pain Management:    Induction:   Airway Management Planned:   Additional Equipment:   Intra-op Plan:   Post-operative Plan: Post-operative intubation/ventilation  Informed Consent: I have reviewed the patients History and Physical, chart, labs and discussed the procedure including the risks, benefits and alternatives for the proposed anesthesia with the patient or authorized representative who has indicated his/her understanding and acceptance.   Dental Advisory Given  Plan Discussed with: Anesthesiologist, CRNA and Surgeon  Anesthesia Plan Comments: (Patient was 95% on RA in preOp clinic, now 87% on room air with lungs feeling subjectively worse.  Patient improved to 89% on RA with DuoNeb.  Concern for COPD exacerbation.  Risks and benefits of anesthesia discussed with patient with our concern that any anesthetic we provide could worsen his possible COPD exacerbation.   Dr. Wyn Quakerew reports that he would normally do this case without anesthesia team.  Patient would like  to proceed without anesthesia.  Patient to see his PCP later today about his lungs.)      Anesthesia Quick Evaluation

## 2016-01-18 NOTE — Progress Notes (Signed)
Pt remains clinically stable post angiogram,with left groin without bleeding nor hematoma, pt awake,alert and oriented, vitals stable, denies complaints. Dr Wyn Quakerew out to speak with patient with questions answered. sr per monitor,

## 2016-01-18 NOTE — OR Nursing (Signed)
8:00 SOB with minimal exertion. Lungs with rhonchi and wheezes, worse in rt.  Dr. Wyn Quakerew notified. SVN duo-neb ordered and given.  Sats remain in upper 80's low 90's. Dr. Randa NgoPiscitello anesthesia in to see regarding using general anesthesia for the case.  Solumedrol given IV as ordered for resp. Condition.

## 2016-01-18 NOTE — H&P (Signed)
[facility] History & Physical Update  The patient was interviewed and re-examined.  The patient's previous History and Physical has been reviewed and is unchanged.  There is no change in the plan of care. We plan to proceed with the scheduled procedure.  Festus BarrenJason Dew, MD  01/18/2016, 8:08 AM

## 2016-01-18 NOTE — Op Note (Signed)
[facility] Percutaneous Study/Intervention Procedural Note     Surgeon(s): [employer]Jason Theressa Piedra  Assistants: none  Pre-operative Diagnosis: 1. 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 Ultrasound guidance for vascular access left femoral artery 2. Catheter placement into primary right hypogastric artery branches from left femoral approach 3. Aortogram and selective angiogram of the right hypogastric artery 4. Coil embolization of the right hypogastric artery using two 8 mm coils and three 10 mm coils. 5. 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 We are going to try to salvage his left hypogastric artery with a branch device, but his right hypogastric artery will need to be treated with coil embolization preliminary to an aneurysm repair to exclude the right hypogastric artery. The patient is brought in for angiography for further evaluation and potential treatment. Risks and benefits are discussed and informed consent is obtained  Procedure: The patient was identified and appropriate procedural time out was performed. The patient was then placed supine on the table and prepped and draped in the usual sterile fashion.Moderate conscious sedation was administered during a face to face encounter with the patient throughout the procedure with my supervision of the RN administering medicines and monitoring the patient's vital signs, pulse oximetry, telemetry and mental  status throughout from the start of the procedure until the patient was taken to the recovery room.  Ultrasound was used to evaluate the left common femoral artery. It was patent . A digital ultrasound image was acquired. A Seldinger needle was used to access the left common femoral artery under direct ultrasound guidance and a permanent image was performed. A 0.035 J wire was advanced without resistance and a 5Fr sheath was placed. Pigtail catheter was placed into the aorta and an AP aortogram was performed. This demonstrated the expected abdominal aortic aneurysm and bilateral iliac artery aneurysms. I then transitioned to an LAO projection and crossed the aortic bifurcation with a VS 1 catheter and an advantage wire. The patient was given 4000 units of intravenous heparin during the procedure. The VS 1 catheter was then removed and a 6 [place] Ansell sheath was placed over the aortic bifurcation into the right external iliac artery. It was then pulled back to the iliac bifurcation and a Kumpe catheter and the advantage wire were used to selectively cannulate the right hypogastric artery and advanced beyond the primary branches. Selective imaging of the right hypogastric artery was performed and we proceeded with coil embolization of the main right hypogastric artery. A total of 5 coils were used. The first 2 coils were 8 mm coils and the last 3 coils were 10 mm coils and this successfully occluded the main right hypogastric artery on selective imaging at the origin of the hypogastric artery with a Kumpe catheter. I elected to terminate the procedure. The diagnostic catheter was removed. StarClose closure device was deployed in usual fashion with excellent hemostatic result. The patient was taken to the recovery room in stable condition having tolerated the procedure well.     Findings: Successful occlusion of the right hypogastric artery with expected aortoiliac aneurysms.  Disposition: Patient was  taken to the recovery  room in stable condition having tolerated the procedure well.  Complications: None  [name] 01/18/2016 11:07 AM   This note was created with Dragon Medical transcription system. Any errors in dictation are purely unintentional.

## 2016-01-19 ENCOUNTER — Encounter: Payer: Self-pay | Admitting: Anesthesiology

## 2016-01-19 ENCOUNTER — Encounter: Payer: Self-pay | Admitting: Vascular Surgery

## 2016-01-20 ENCOUNTER — Encounter
Admission: RE | Disposition: A | Discharge status: Home / Self Care | Payer: Self-pay | Source: Ambulatory Visit | Attending: Vascular Surgery

## 2016-01-20 ENCOUNTER — Ambulatory Visit
Admission: RE | Admit: 2016-01-20 | Discharge: 2016-01-20 | Disposition: A | Discharge status: Home / Self Care | Payer: Medicare Other | Source: Ambulatory Visit | Attending: Vascular Surgery | Admitting: Vascular Surgery

## 2016-01-20 ENCOUNTER — Emergency Department
Admission: EM | Admit: 2016-01-20 | Discharge: 2016-01-20 | Disposition: A | Discharge status: Home / Self Care | Payer: Medicare Other | Attending: Emergency Medicine | Admitting: Emergency Medicine

## 2016-01-20 ENCOUNTER — Telehealth: Payer: Self-pay | Admitting: Urology

## 2016-01-20 ENCOUNTER — Encounter: Payer: Self-pay | Admitting: Emergency Medicine

## 2016-01-20 DIAGNOSIS — I10 Essential (primary) hypertension: Secondary | ICD-10-CM | POA: Diagnosis not present

## 2016-01-20 DIAGNOSIS — Z79899 Other long term (current) drug therapy: Secondary | ICD-10-CM | POA: Diagnosis not present

## 2016-01-20 DIAGNOSIS — I2510 Atherosclerotic heart disease of native coronary artery without angina pectoris: Secondary | ICD-10-CM | POA: Insufficient documentation

## 2016-01-20 DIAGNOSIS — N5089 Other specified disorders of the male genital organs: Secondary | ICD-10-CM

## 2016-01-20 DIAGNOSIS — N509 Disorder of male genital organs, unspecified: Secondary | ICD-10-CM | POA: Diagnosis not present

## 2016-01-20 DIAGNOSIS — N50811 Right testicular pain: Secondary | ICD-10-CM | POA: Diagnosis not present

## 2016-01-20 DIAGNOSIS — I714 Abdominal aortic aneurysm, without rupture: Secondary | ICD-10-CM | POA: Insufficient documentation

## 2016-01-20 DIAGNOSIS — J45909 Unspecified asthma, uncomplicated: Secondary | ICD-10-CM | POA: Insufficient documentation

## 2016-01-20 DIAGNOSIS — Z5309 Procedure and treatment not carried out because of other contraindication: Secondary | ICD-10-CM | POA: Insufficient documentation

## 2016-01-20 DIAGNOSIS — J449 Chronic obstructive pulmonary disease, unspecified: Secondary | ICD-10-CM | POA: Insufficient documentation

## 2016-01-20 DIAGNOSIS — F17210 Nicotine dependence, cigarettes, uncomplicated: Secondary | ICD-10-CM | POA: Insufficient documentation

## 2016-01-20 DIAGNOSIS — L02214 Cutaneous abscess of groin: Secondary | ICD-10-CM | POA: Diagnosis present

## 2016-01-20 DIAGNOSIS — N492 Inflammatory disorders of scrotum: Secondary | ICD-10-CM | POA: Diagnosis not present

## 2016-01-20 LAB — CBC WITH DIFFERENTIAL/PLATELET
BASOS ABS: 0.1 10*3/uL (ref 0–0.1)
BASOS PCT: 1 %
Eosinophils Absolute: 0 10*3/uL (ref 0–0.7)
Eosinophils Relative: 0 %
HEMATOCRIT: 37.6 % — AB (ref 40.0–52.0)
Hemoglobin: 12.1 g/dL — ABNORMAL LOW (ref 13.0–18.0)
LYMPHS PCT: 12 %
Lymphs Abs: 1.5 10*3/uL (ref 1.0–3.6)
MCH: 26.5 pg (ref 26.0–34.0)
MCHC: 32.1 g/dL (ref 32.0–36.0)
MCV: 82.4 fL (ref 80.0–100.0)
Monocytes Absolute: 1.3 10*3/uL — ABNORMAL HIGH (ref 0.2–1.0)
Monocytes Relative: 11 %
NEUTROS ABS: 9.8 10*3/uL — AB (ref 1.4–6.5)
Neutrophils Relative %: 76 %
Platelets: 227 10*3/uL (ref 150–440)
RBC: 4.57 MIL/uL (ref 4.40–5.90)
RDW: 16.4 % — ABNORMAL HIGH (ref 11.5–14.5)
WBC: 12.8 10*3/uL — AB (ref 3.8–10.6)

## 2016-01-20 LAB — COMPREHENSIVE METABOLIC PANEL
ALT: 8 U/L — ABNORMAL LOW (ref 17–63)
ANION GAP: 9 (ref 5–15)
AST: 21 U/L (ref 15–41)
Albumin: 3.4 g/dL — ABNORMAL LOW (ref 3.5–5.0)
Alkaline Phosphatase: 45 U/L (ref 38–126)
BUN: 13 mg/dL (ref 6–20)
CO2: 31 mmol/L (ref 22–32)
Calcium: 8.8 mg/dL — ABNORMAL LOW (ref 8.9–10.3)
Chloride: 95 mmol/L — ABNORMAL LOW (ref 101–111)
Creatinine, Ser: 1.01 mg/dL (ref 0.61–1.24)
Glucose, Bld: 116 mg/dL — ABNORMAL HIGH (ref 65–99)
Potassium: 3 mmol/L — ABNORMAL LOW (ref 3.5–5.1)
Sodium: 135 mmol/L (ref 135–145)
TOTAL PROTEIN: 7.9 g/dL (ref 6.5–8.1)
Total Bilirubin: 0.5 mg/dL (ref 0.3–1.2)

## 2016-01-20 LAB — SURGICAL PCR SCREEN
MRSA, PCR: NEGATIVE
Staphylococcus aureus: POSITIVE — AB

## 2016-01-20 SURGERY — ENDOVASCULAR REPAIR/STENT GRAFT
Anesthesia: General

## 2016-01-20 MED ORDER — CHLORHEXIDINE GLUCONATE CLOTH 2 % EX PADS: 6.0000 | MEDICATED_PAD | Freq: Once | CUTANEOUS | Status: DC

## 2016-01-20 MED ORDER — PIPERACILLIN-TAZOBACTAM 3.375 G IVPB 30 MIN
3.3750 g | Freq: Once | INTRAVENOUS | Status: AC
  Administered 2016-01-20: 3.375 g via INTRAVENOUS
  Filled 2016-01-20: qty 50

## 2016-01-20 MED ORDER — SODIUM CHLORIDE 0.9 % IV SOLN: INTRAVENOUS | Status: DC

## 2016-01-20 MED ORDER — TRAMADOL HCL 50 MG PO TABS: 50.0000 mg | ORAL_TABLET | Freq: Two times a day (BID) | ORAL | 0 refills | Status: DC | PRN

## 2016-01-20 MED ORDER — FAMOTIDINE 20 MG PO TABS: 20.0000 mg | ORAL_TABLET | Freq: Once | ORAL | Status: DC

## 2016-01-20 MED ORDER — SULFAMETHOXAZOLE-TRIMETHOPRIM 800-160 MG PO TABS: 1.0000 | ORAL_TABLET | Freq: Two times a day (BID) | ORAL | 0 refills | Status: DC

## 2016-01-20 MED ORDER — LIDOCAINE HCL (PF) 1 % IJ SOLN
25.0000 mL | Freq: Once | INTRAMUSCULAR | Status: AC
  Administered 2016-01-20: 25 mL via INTRADERMAL
  Filled 2016-01-20: qty 30

## 2016-01-20 MED ORDER — LIDOCAINE HCL (PF) 1 % IJ SOLN
INTRAMUSCULAR | Status: AC
  Administered 2016-01-20: 25 mL via INTRADERMAL
  Filled 2016-01-20: qty 25

## 2016-01-20 MED ORDER — HYDROMORPHONE HCL 1 MG/ML IJ SOLN
0.5000 mg | Freq: Once | INTRAMUSCULAR | Status: AC
  Administered 2016-01-20: 0.5 mg via INTRAVENOUS
  Filled 2016-01-20: qty 1

## 2016-01-20 MED ORDER — CEFAZOLIN IN D5W 1 GM/50ML IV SOLN
1.0000 g | Freq: Once | INTRAVENOUS | Status: AC
  Administered 2016-01-20: 1 g via INTRAVENOUS
  Filled 2016-01-20: qty 50

## 2016-01-20 MED ORDER — CEFAZOLIN SODIUM-DEXTROSE 2-4 GM/100ML-% IV SOLN
2.0000 g | INTRAVENOUS | Status: DC
  Filled 2016-01-20: qty 100

## 2016-01-20 NOTE — ED Notes (Signed)
Dr. Brandon at bedside.  

## 2016-01-20 NOTE — ED Provider Notes (Signed)
[facility] Emergency Department Provider Note   ____________________________________________   First MD Initiated Contact with Patient 01/20/16 1004     (approximate)  I have reviewed the triage vital signs and the nursing notes.   HISTORY  Chief Complaint Abscess    HPI Jacob Ford is a 76 y.o. male patient is sent over from pre-op secondary to a right scrotal mass. Patient was scheduled for AAA repair today. It was noted at preop the area they were trying prepping is edematous and erythematous. Patient rates his pain as a 5 over 5. No palliative measures for this complaint.   Past Medical History:  Diagnosis Date  . Asthma   . CAD (coronary artery disease)    a. 2002 Cath: Nonobs dzs per patient;  b. 11/2014 NSTEMI/PCI: LM nl, LAD 30p, 60p, 80d, D1/2/3 min irregs, RI min irregs, LCX, 13m, OM1 min irregs, OM2 nl, OM3 50ost, RCA 40p, 100d (2.25x18 Xience Alpine DES), EF 55-65%; c. 01/2015 MV: nl EF, no ischemia.  [name] COPD (chronic obstructive pulmonary disease) (HCC)   . Cough    SMOKER. DENIES FEVER/WHEEZING/ COUGH NONPRODUCTIVE  . Dyspnea    chronic  . Hyperlipidemia   . Hypertension   . Hypertensive heart disease   . Myocardial infarction    2016  . Peripheral vascular disease (HCC)   . Tobacco abuse   . Tobacco abuse     Patient Active Problem List   Diagnosis Date Noted  . Essential hypertension, benign 12/25/2015  . AAA (abdominal aortic aneurysm) without rupture (HCC) 12/25/2015  . Tobacco abuse   . Hyperlipidemia   . CAD (coronary artery disease)   . Hypertensive heart disease   . COPD (chronic obstructive pulmonary disease) (HCC)   . Angina pectoris (HCC)   . Coronary artery disease involving native coronary artery of native heart with angina pectoris with documented spasm (HCC)   . Smoker   . NSTEMI (non-ST elevated myocardial infarction) (HCC) 12/07/2014    Past Surgical History:  Procedure Laterality Date  . CARDIAC  CATHETERIZATION    . CARDIAC CATHETERIZATION Bilateral 12/08/2014   Procedure: Left Heart Cath and Coronary Angiography;  Surgeon: Iran Ouch, MD;  Location: [facility] INVASIVE CV LAB;  Service: Cardiovascular;  Laterality: Bilateral;  . CARDIAC CATHETERIZATION N/A 12/08/2014   Procedure: Coronary Stent Intervention;  Surgeon: Iran Ouch, MD;  Location: [facility] INVASIVE CV LAB;  Service: Cardiovascular;  Laterality: N/A;  . CORONARY ANGIOPLASTY     stent 2016  . PERIPHERAL VASCULAR CATHETERIZATION Right 01/18/2016   Procedure: Embolization;  Surgeon: Annice Needy, MD;  Location: [facility] INVASIVE CV LAB;  Service: Cardiovascular;  Laterality: Right;  . SHOULDER SURGERY Left     Prior to Admission medications   Medication Sig Start Date End Date Taking? Authorizing Provider  aspirin EC 81 MG EC tablet Take 1 tablet (81 mg total) by mouth daily. 12/09/14   Enedina Finner, MD  atorvastatin (LIPITOR) 80 MG tablet Take 1 tablet (80 mg total) by mouth daily at 6 PM. 12/09/14   Enedina Finner, MD  clopidogrel (PLAVIX) 75 MG tablet Take 1 tablet (75 mg total) by mouth daily. 12/15/15   Iran Ouch, MD  lisinopril-hydrochlorothiazide (PRINZIDE,ZESTORETIC) 20-25 MG tablet Take 1 tablet by mouth daily.    Historical Provider, MD  metoprolol (LOPRESSOR) 25 MG tablet Take 1 tablet (25 mg total) by mouth 2 (two) times daily. 01/15/15   Ok Anis, NP  montelukast (SINGULAIR) 10 MG tablet  Take 10 mg by mouth at bedtime.    Historical Provider, MD  nitroGLYCERIN (NITROSTAT) 0.4 MG SL tablet Place 1 tablet (0.4 mg total) under the tongue every 5 (five) minutes as needed for chest pain. 12/09/14   Antonieta Ibaimothy J Gollan, MD  sulfamethoxazole-trimethoprim (BACTRIM DS,SEPTRA DS) 800-160 MG tablet Take 1 tablet by mouth 2 (two) times daily. 01/20/16   Joni Reiningonald K Kassy Mcenroe, PA-C  traMADol (ULTRAM) 50 MG tablet Take 1 tablet (50 mg total) by mouth every 12 (twelve) hours as needed. 01/20/16   Joni Reiningonald K Ardis Lawley, PA-C     Allergies Patient has no known allergies.  Family History  Problem Relation Age of Onset  . Hypertension Mother   . Hypertension Father   . CAD Brother   . CAD Brother   . Stroke Sister   . Stroke Sister     Social History Social History  Substance Use Topics  . Smoking status: Current Every Day Smoker    Packs/day: 1.00    Types: Cigarettes  . Smokeless tobacco: Never Used  . Alcohol use No    Review of Systems Constitutional: No fever/chills Eyes: No visual changes. ENT: No sore throat. Cardiovascular: Denies chest pain. Respiratory: Denies shortness of breath. Gastrointestinal: No abdominal pain.  No nausea, no vomiting.  No diarrhea.  No constipation. Genitourinary: Negative for dysuria. Musculoskeletal: Negative for back pain. Skin: Negative for rash. Scrotum edema Neurological: Negative for headaches, focal weakness or numbness. Endocrine:Hyperlipidemia and hypertension. ____________________________________________   PHYSICAL EXAM:  VITAL SIGNS: ED Triage Vitals  Enc Vitals Group     BP 01/20/16 0928 130/79     Pulse Rate 01/20/16 0928 81     Resp 01/20/16 0928 18     Temp 01/20/16 0928 98.5 F (36.9 C)     Temp Source 01/20/16 0928 Oral     SpO2 01/20/16 0928 98 %     Weight 01/20/16 0929 217 lb (98.4 kg)     Height 01/20/16 0929 6\' 2"  (1.88 m)     Head Circumference --      Peak Flow --      Pain Score 01/20/16 0936 5     Pain Loc --      Pain Edu? --      Excl. in GC? --     Constitutional: Alert and oriented. Well appearing and in no acute distress. Eyes: Conjunctivae are normal. PERRL. EOMI. Head: Atraumatic. Nose: No congestion/rhinnorhea. Mouth/Throat: Mucous membranes are moist.  Oropharynx non-erythematous. Neck: No stridor.  No cervical spine tenderness to palpation. Hematological/Lymphatic/Immunilogical: No cervical lymphadenopathy. Cardiovascular: Normal rate, regular rhythm. Grossly normal heart sounds.  Good peripheral  circulation. Respiratory: Normal respiratory effort.  No retractions. Lungs CTAB. Gastrointestinal: Soft and nontender. No distention. No abdominal bruits. No CVA tenderness. Musculoskeletal: No lower extremity tenderness nor edema.  No joint effusions. Neurologic:  Normal speech and language. No gross focal neurologic deficits are appreciated. No gait instability. Skin:  Skin is warm, dry and intact. No rash noted. Edema and erythema right scrotum area. Psychiatric: Mood and affect are normal. Speech and behavior are normal.  ____________________________________________   LABS (all labs ordered are listed, but only abnormal results are displayed)  Labs Reviewed  COMPREHENSIVE METABOLIC PANEL - Abnormal; Notable for the following:       Result Value   Potassium 3.0 (*)    Chloride 95 (*)    Glucose, Bld 116 (*)    Calcium 8.8 (*)    Albumin 3.4 (*)  ALT 8 (*)    All other components within normal limits  CBC WITH DIFFERENTIAL/PLATELET - Abnormal; Notable for the following:    WBC 12.8 (*)    Hemoglobin 12.1 (*)    HCT 37.6 (*)    RDW 16.4 (*)    Neutro Abs 9.8 (*)    Monocytes Absolute 1.3 (*)    All other components within normal limits  CULTURE, BLOOD (ROUTINE X 2)  CULTURE, BLOOD (ROUTINE X 2)  AEROBIC CULTURE (SUPERFICIAL SPECIMEN)   ____________________________________________  EKG   ____________________________________________  RADIOLOGY   ____________________________________________   PROCEDURES  Procedure(s) performed: None  Procedures  Critical Care performed: No  ____________________________________________   INITIAL IMPRESSION / ASSESSMENT AND PLAN / ED COURSE  Pertinent labs & imaging results that were available during my care of the patient were reviewed by me and considered in my medical decision making (see chart for details). Scrotal abscess. Patient given discharge care instructions. Patient advised follow urologist in 2 weeks.  Patient discharged with prescription for Bactrim DS for 2 weeks. Patient given prescription for tramadol twice a day for 5 days.  Clinical Course    Patient sent from preop secondary to scrotum edema or erythema. Exam is consistent with abscess and urologist on-call was consulted. Patient was started on Ancef IV and per consult with urologist zosyn was initiated prior to her arrival to incise and drain the lesion. Status post procedure by urologist patient was discharged with instructions for home care. ____________________________________________   FINAL CLINICAL IMPRESSION(S) / ED DIAGNOSES  Final diagnoses:  Scrotum, abscess      NEW MEDICATIONS STARTED DURING THIS VISIT:  New Prescriptions   SULFAMETHOXAZOLE-TRIMETHOPRIM (BACTRIM DS,SEPTRA DS) 800-160 MG TABLET    Take 1 tablet by mouth 2 (two) times daily.   TRAMADOL (ULTRAM) 50 MG TABLET    Take 1 tablet (50 mg total) by mouth every 12 (twelve) hours as needed.     Note:  This document was prepared using Dragon voice recognition software and may include unintentional dictation errors.    Joni Reining, PA-C 01/20/16 1317    Nita Sickle, MD 01/21/16 [phone]

## 2016-01-20 NOTE — ED Notes (Signed)
Pt verbalized understanding of d/c instructions, medications and follow up 

## 2016-01-20 NOTE — OR Nursing (Signed)
9:10  Patient in for AA repair. Lungs with scattered rhonchi and wheezes. sats 97% on RA. Has a possible rt scrotal abscess. The area is hard and draining a mod. amount of clear looking liquid.  Dr. Wyn Quakerew notified and request the patient go to the ED.  The ED charge nurse notified of the patient condition and reason for coming for evaluation and treatment.  The AAA repair cancelled for now.

## 2016-01-20 NOTE — ED Triage Notes (Signed)
Pt to ed with c/o abscess in right groin.  Pt states he was scheduled for AAA repair today but the area they were going to use has an abscess in it.  Pt was sent here from pre op.

## 2016-01-20 NOTE — Telephone Encounter (Signed)
done

## 2016-01-20 NOTE — Consult Note (Signed)
Urology Consult  I have been asked to see the patient by Dr. Knox SalivaVeronese/ Ronald Smith, PA, for evaluation and management of right scrotal abscess.  Chief Complaint: scrotal swelling  History of Present Illness: Jacob Ford is a 76 y.o. year old male initially scheduled today to undergo repair of his AAA via endovascular procedure was found to have a large, fluctuant right hemiscrotal mass consistent with scrotal abscess. He notes that its been present for at least 5 days and started to drain slightly yesterday but not completely.  He denies any associated fevers or chills. 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 He reports that he gets one at least once a year but has never sought medical attention for these. He says they ultimately end up draining and resolving spontaneously without antibiotics or drainage.  Past Medical History:  Diagnosis Date  . Asthma   . CAD (coronary artery disease)    a. 2002 Cath: Nonobs dzs per patient;  b. 11/2014 NSTEMI/PCI: LM nl, LAD 30p, 60p, 80d, D1/2/3 min irregs, RI min irregs, LCX, 6071m, OM1 min irregs, OM2 nl, OM3 50ost, RCA 40p, 100d (2.25x18 Xience Alpine DES), EF 55-65%; c. 01/2015 MV: nl EF, no ischemia.  [name]. COPD (chronic obstructive pulmonary disease) (HCC)   . Cough    SMOKER. DENIES FEVER/WHEEZING/ COUGH NONPRODUCTIVE  . Dyspnea    chronic  . Hyperlipidemia   . Hypertension   . Hypertensive heart disease   . Myocardial infarction    2016  . Peripheral vascular disease (HCC)   . Tobacco abuse   . Tobacco abuse     Past Surgical History:  Procedure Laterality Date  . CARDIAC CATHETERIZATION    . CARDIAC CATHETERIZATION Bilateral 12/08/2014   Procedure: Left Heart Cath and Coronary Angiography;  Surgeon: [name], MD;  Location: [facility] INV[facility]IVE CV [facility];  Service: Cardiovascular;  Laterality: Bilateral;  . CARDIAC CATHETERIZATION N/A 12/08/2014   Procedure: Coronary Stent Intervention;  Surgeon: [name], MD;  Location: [facility];  Service: Cardiovascular;  Laterality: N/A;  . CORONARY ANGIOPLASTY     stent 2016  . PERIPHERAL VASCULAR CATHETERIZATION Right 01/18/2016   Procedure: Embolization;  Surgeon: Annice NeedyJason S Dew, MD;  Location: [facility] CV [facility];  Service: Cardiovascular;  Laterality: Right;  . SHOULDER SURGERY Left     Home Medications:  No outpatient prescriptions have been marked as taking for the 01/20/16 encounter [facility](Hospital Encounter).    Allergies: No Known Allergies  Family History  Problem Relation Age of Onset  . Hypertension Mother   . Hypertension Father   . CAD Brother   . CAD Brother   . Stroke Sister   . Stroke Sister     Social History:  reports that he has been smoking Cigarettes.  He has been smoking about 1.00 pack per day. He has never used smokeless tobacco. He reports that he does not drink alcohol or use drugs.  ROS: A complete review of systems was performed.  All systems are negative except for pertinent findings as noted.  Physical Exam:  Vital signs in last 24 hours: Temp:  [98.5 F (36.9 C)] 98.5 F (36.9 C) (01/10 0928) Pulse Rate:  [81-85] 85 (01/10 1154) Resp:  [18] 18 (01/10 1154) BP: (130-142)/(79-82) 142/82 (01/10 1154) SpO2:  [97 %-98 %] 97 % (01/10 1154) Weight:  [217 lb (98.4 kg)] 217 lb (98.4 kg) (01/10 0929) Constitutional:  Alert and oriented, No acute distress HEENT: [place] AT, moist mucus membranes.  Trachea midline, no masses Cardiovascular: Regular rate and rhythm, no clubbing, cyanosis, or edema. Respiratory: Normal respiratory effort, lungs clear bilaterally GI: Abdomen is soft, nontender, nondistended, no abdominal masses GU: Uncircumcised phallus with orthotopic meatus. Chronic scrotal thickening appreciated particularly over right hemiscrotum. There is a large tender fluctuant mass in the mid lateral portion of the right hemiscrotum consistent with  abscess and a scant amount of drainage. Left hemiscrotum is normal. Bilateral testicles unremarkable, descended. Skin: No rashes, bruises or suspicious lesions Lymph: No cervical or inguinal adenopathy Neurologic: Grossly intact, no focal deficits, moving all 4 extremities Psychiatric: Normal mood and affect   Laboratory Data:   Recent Labs  01/20/16 1046  WBC 12.8*  HGB 12.1*  HCT 37.6*    Recent Labs  01/20/16 1046  NA 135  K 3.0*  CL 95*  CO2 31  GLUCOSE 116*  BUN 13  CREATININE 1.01  CALCIUM 8.8*     Procedure: Informed consent was obtained. Patient received IV Ancef and gentamicin prior to the procedure. He also received IV pain medicine. The site was prepped using Betadine solution and draped. Approximately 10 cc of 1% lidocaine was used to anesthetize the overlying skin. A cruciate incision was made using an 11 blade scalpel overlying the fluctuant area in the right hemiscrotum. At this point in time, a large amount of purulent, foul-smelling material was evacuated from the abscess cavity, at least 25 cc. Culture swab was used to obtain and anaerobic urine culture. The base of the cavity was probed to break up any loculations. The wound was then packed using quarter inch packing tape. There is some oozing noted is the patient's on anticoagulation. He was then cleaned and dried. 4 x 4's were placed over the incision.  The procedure was moderately well tolerated.  Impression/Assessment:  76 year old male with large right hemiscrotal abscess status post bedside I&D.  Plan:  -Patient advised to remove several inches of packing tape daily, do not feel that he would tolerate daily packing or be compliant with follow-up -Advised to be discharged on 2 weeks of Bactrim, will follow-up culture data and adjust as needed -I would like to see him in 2 weeks for a wound check -Warning symptoms return to ED were reviewed in detail -Recommendations were communicated with Nona Dell, PA  01/20/2016, 1:22 PM  Vanna Scotland,  MD

## 2016-01-21 ENCOUNTER — Encounter (INDEPENDENT_AMBULATORY_CARE_PROVIDER_SITE_OTHER): Payer: Self-pay

## 2016-01-22 LAB — AEROBIC CULTURE  (SUPERFICIAL SPECIMEN): SPECIAL REQUESTS: NORMAL

## 2016-01-22 LAB — AEROBIC CULTURE W GRAM STAIN (SUPERFICIAL SPECIMEN)

## 2016-01-25 LAB — CULTURE, BLOOD (ROUTINE X 2)
CULTURE: NO GROWTH
Culture: NO GROWTH

## 2016-02-03 ENCOUNTER — Ambulatory Visit: Payer: Medicare Other | Admitting: Urology

## 2016-02-03 ENCOUNTER — Encounter: Payer: Self-pay | Admitting: Urology

## 2016-02-03 VITALS — BP 94/58 | HR 99 | Ht 74.0 in | Wt 211.9 lb

## 2016-02-03 DIAGNOSIS — N492 Inflammatory disorders of scrotum: Secondary | ICD-10-CM

## 2016-02-03 NOTE — Progress Notes (Signed)
02/03/2016 3:58 PM   Jacob Ford 1940-06-12 [id number]  Referring provider: Derwood Kaplan, MD [address] [place], [place] [address]  Chief Complaint  Patient presents with  . Wound Check    HPI: Patient is a 76 year old African American male who presents today for a scrotal wound check.  Patient underwent an I & D of a right scrotal abscess on 01/20/2016 with Dr. Apolinar Junes for a large right hemiscrotal abscess.  Post procedurally he did not experience any fevers, chills, nausea or vomiting.  He has not had any further drainage, scrotal pain or swelling.    He has no other complaints at this time.     PMH: Past Medical History:  Diagnosis Date  . Asthma   . CAD (coronary artery disease)    a. 2002 Cath: Nonobs dzs per patient;  b. 11/2014 NSTEMI/PCI: LM nl, LAD 30p, 60p, 80d, D1/2/3 min irregs, RI min irregs, LCX, 69m, OM1 min irregs, OM2 nl, OM3 50ost, RCA 40p, 100d (2.25x18 Xience Alpine DES), EF 55-65%; c. 01/2015 MV: nl EF, no ischemia.  [name] COPD (chronic obstructive pulmonary disease) (HCC)   . Cough    SMOKER. DENIES FEVER/WHEEZING/ COUGH NONPRODUCTIVE  . Dyspnea    chronic  . Hyperlipidemia   . Hypertension   . Hypertensive heart disease   . Myocardial infarction    2016  . Peripheral vascular disease (HCC)   . Tobacco abuse   . Tobacco abuse     Surgical History: Past Surgical History:  Procedure Laterality Date  . CARDIAC CATHETERIZATION    . CARDIAC CATHETERIZATION Bilateral 12/08/2014   Procedure: Left Heart Cath and Coronary Angiography;  Surgeon: Iran Ouch, MD;  Location: [facility];  Service: Cardiovascular;  Laterality: Bilateral;  . CARDIAC CATHETERIZATION N/A 12/08/2014   Procedure: Coronary Stent Intervention;  Surgeon: Iran Ouch, MD;  Location: [facility];  Service: Cardiovascular;  Laterality: N/A;  . CORONARY ANGIOPLASTY     stent 2016  . PERIPHERAL VASCULAR CATHETERIZATION Right 01/18/2016   Procedure:  Embolization;  Surgeon: Annice Needy, MD;  Location: [facility];  Service: Cardiovascular;  Laterality: Right;  . SHOULDER SURGERY Left     Home Medications:  Allergies as of 02/03/2016   No Known Allergies     Medication List       Accurate as of 02/03/16  3:58 PM. Always use your most recent med list.          aspirin 81 MG EC tablet Take 1 tablet (81 mg total) by mouth daily.   atorvastatin 80 MG tablet Commonly known as:  LIPITOR Take 1 tablet (80 mg total) by mouth daily at 6 PM.   BREO ELLIPTA 100-25 MCG/INH Aepb Generic drug:  fluticasone furoate-vilanterol Inhale 1 puff into the lungs daily.   clopidogrel 75 MG tablet Commonly known as:  PLAVIX Take 1 tablet (75 mg total) by mouth daily.   lisinopril-hydrochlorothiazide 20-25 MG tablet Commonly known as:  PRINZIDE,ZESTORETIC Take 1 tablet by mouth daily.   metoprolol tartrate 25 MG tablet Commonly known as:  LOPRESSOR Take 1 tablet (25 mg total) by mouth 2 (two) times daily.   montelukast 10 MG tablet Commonly known as:  SINGULAIR Take 10 mg by mouth at bedtime.   nitroGLYCERIN 0.4 MG SL tablet Commonly known as:  NITROSTAT Place 1 tablet (0.4 mg total) under the tongue every 5 (five) minutes as needed for chest pain.  Allergies: No Known Allergies  Family History: Family History  Problem Relation Age of Onset  . Hypertension Mother   . Hypertension Father   . CAD Brother   . CAD Brother   . Stroke Sister   . Stroke Sister     Social History:  reports that he has been smoking Cigarettes.  He has been smoking about 1.00 pack per day. He has never used smokeless tobacco. He reports that he does not drink alcohol or use drugs.  ROS: UROLOGY Frequent Urination?: No Hard to postpone urination?: No Burning/pain with urination?: No Get up at night to urinate?: No Leakage of urine?: No Urine stream starts and stops?: No Trouble starting stream?: No Do you have to strain to  urinate?: No Blood in urine?: No Urinary tract infection?: No Sexually transmitted disease?: No Injury to kidneys or bladder?: No Painful intercourse?: No Weak stream?: No Erection problems?: No Penile pain?: No  Gastrointestinal Nausea?: No Vomiting?: No Indigestion/heartburn?: No Diarrhea?: No Constipation?: No  Constitutional Fever: No Night sweats?: No Weight loss?: No Fatigue?: No  Skin Skin rash/lesions?: No Itching?: No  Eyes Blurred vision?: Yes Double vision?: No  Ears/Nose/Throat Sore throat?: No Sinus problems?: No  Hematologic/Lymphatic Swollen glands?: No Easy bruising?: No  Cardiovascular Leg swelling?: No Chest pain?: No  Respiratory Cough?: Yes Shortness of breath?: Yes  Endocrine Excessive thirst?: No  Musculoskeletal Back pain?: No Joint pain?: No  Neurological Headaches?: No Dizziness?: No  Psychologic Depression?: No Anxiety?: No  Physical Exam: BP (!) 94/58 (BP Location: Left Arm, Patient Position: Sitting, Cuff Size: Normal)   Pulse 99   Ht 6\' 2"  (1.88 m)   Wt 211 lb 14.4 oz (96.1 kg)   BMI 27.21 kg/m   Constitutional: Well nourished. Alert and oriented, No acute distress. HEENT: [place] AT, moist mucus membranes. Trachea midline, no masses. Cardiovascular: No clubbing, cyanosis, or edema. Respiratory: Normal respiratory effort, no increased work of breathing. GI: Abdomen is soft, non tender, non distended, no abdominal masses. Liver and spleen not palpable.  No hernias appreciated.  Stool sample for occult testing is not indicated.   GU: No CVA tenderness.  No bladder fullness or masses.  Patient with uncircumcised phallus.  Foreskin easily retracted  Urethral meatus is patent.  No penile discharge. No penile lesions or rashes. Scrotum without lesions, cysts, rashes and/or edema.  Area of previous abscess is well healed.  Testicles are located scrotally bilaterally. No masses are appreciated in the testicles. Left and right  epididymis are normal. Rectal: Deferred.   Skin: No rashes, bruises or suspicious lesions. Lymph: No cervical or inguinal adenopathy. Neurologic: Grossly intact, no focal deficits, moving all 4 extremities. Psychiatric: Normal mood and affect.  Laboratory Data: Lab Results  Component Value Date   WBC 12.8 (H) 01/20/2016   HGB 12.1 (L) 01/20/2016   HCT 37.6 (L) 01/20/2016   MCV 82.4 01/20/2016   PLT 227 01/20/2016    Lab Results  Component Value Date   CREATININE 1.01 01/20/2016     Lab Results  Component Value Date   HGBA1C 6.6 (H) 12/08/2014        Component Value Date/Time   CHOL 122 01/15/2015 1110   CHOL 161 12/14/2012 0346   HDL 36 (L) 01/15/2015 1110   HDL 47 12/14/2012 0346   CHOLHDL 3.4 01/15/2015 1110   CHOLHDL 4.6 12/08/2014 0337   VLDL 10 12/08/2014 0337   VLDL 28 12/14/2012 0346   LDLCALC 72 01/15/2015 1110   LDLCALC 86  12/14/2012 0346    Lab Results  Component Value Date   AST 21 01/20/2016   Lab Results  Component Value Date   ALT 8 (L) 01/20/2016      Assessment & Plan:    1. Right scrotal abscess  - s/p I & D; well healed  - RTC prn    Return if symptoms worsen or fail to improve.  These notes generated with voice recognition software. I apologize for typographical errors.  Michiel CowboySHANNON Haiden Rawlinson, PA-C  [facility] [address], [address] [place], [place] [address] [phone]

## 2016-02-10 ENCOUNTER — Ambulatory Visit: Payer: Medicare Other | Admitting: Anesthesiology

## 2016-02-10 ENCOUNTER — Inpatient Hospital Stay
Admission: AD | Admit: 2016-02-10 | Discharge: 2016-02-11 | DRG: 269 | Disposition: A | Discharge status: Home / Self Care | Payer: Medicare Other | Source: Ambulatory Visit | Attending: Vascular Surgery | Admitting: Vascular Surgery

## 2016-02-10 ENCOUNTER — Encounter
Admission: AD | Disposition: A | Discharge status: Home / Self Care | Payer: Self-pay | Source: Ambulatory Visit | Attending: Vascular Surgery

## 2016-02-10 DIAGNOSIS — I119 Hypertensive heart disease without heart failure: Secondary | ICD-10-CM | POA: Diagnosis present

## 2016-02-10 DIAGNOSIS — Z8249 Family history of ischemic heart disease and other diseases of the circulatory system: Secondary | ICD-10-CM | POA: Diagnosis not present

## 2016-02-10 DIAGNOSIS — I723 Aneurysm of iliac artery: Secondary | ICD-10-CM | POA: Diagnosis present

## 2016-02-10 DIAGNOSIS — Z955 Presence of coronary angioplasty implant and graft: Secondary | ICD-10-CM | POA: Diagnosis not present

## 2016-02-10 DIAGNOSIS — E785 Hyperlipidemia, unspecified: Secondary | ICD-10-CM | POA: Diagnosis present

## 2016-02-10 DIAGNOSIS — I2510 Atherosclerotic heart disease of native coronary artery without angina pectoris: Secondary | ICD-10-CM | POA: Diagnosis present

## 2016-02-10 DIAGNOSIS — I252 Old myocardial infarction: Secondary | ICD-10-CM | POA: Diagnosis not present

## 2016-02-10 DIAGNOSIS — I739 Peripheral vascular disease, unspecified: Secondary | ICD-10-CM | POA: Diagnosis present

## 2016-02-10 DIAGNOSIS — F17210 Nicotine dependence, cigarettes, uncomplicated: Secondary | ICD-10-CM | POA: Diagnosis present

## 2016-02-10 DIAGNOSIS — I714 Abdominal aortic aneurysm, without rupture, unspecified: Secondary | ICD-10-CM | POA: Diagnosis present

## 2016-02-10 DIAGNOSIS — J449 Chronic obstructive pulmonary disease, unspecified: Secondary | ICD-10-CM | POA: Diagnosis present

## 2016-02-10 DIAGNOSIS — I771 Stricture of artery: Secondary | ICD-10-CM | POA: Diagnosis not present

## 2016-02-10 HISTORY — PX: ENDOVASCULAR REPAIR/STENT GRAFT: CATH118280

## 2016-02-10 LAB — MRSA PCR SCREENING: MRSA by PCR: NEGATIVE

## 2016-02-10 LAB — GLUCOSE, CAPILLARY: Glucose-Capillary: 125 mg/dL — ABNORMAL HIGH (ref 65–99)

## 2016-02-10 SURGERY — ENDOVASCULAR REPAIR/STENT GRAFT
Anesthesia: General

## 2016-02-10 MED ORDER — DOPAMINE-DEXTROSE 3.2-5 MG/ML-% IV SOLN: 3.0000 ug/kg/min | INTRAVENOUS | Status: DC

## 2016-02-10 MED ORDER — PHENYLEPHRINE HCL 10 MG/ML IJ SOLN
INTRAMUSCULAR | Status: AC
Start: 2016-02-10 — End: 2016-02-10
  Filled 2016-02-10: qty 1

## 2016-02-10 MED ORDER — POTASSIUM CHLORIDE CRYS ER 20 MEQ PO TBCR: 20.0000 meq | EXTENDED_RELEASE_TABLET | Freq: Every day | ORAL | Status: DC | PRN

## 2016-02-10 MED ORDER — ONDANSETRON HCL 4 MG/2ML IJ SOLN
INTRAMUSCULAR | Status: DC | PRN
  Administered 2016-02-10: 4 mg via INTRAVENOUS

## 2016-02-10 MED ORDER — ALUM & MAG HYDROXIDE-SIMETH 200-200-20 MG/5ML PO SUSP: 15.0000 mL | ORAL | Status: DC | PRN

## 2016-02-10 MED ORDER — FAMOTIDINE 20 MG PO TABS
ORAL_TABLET | ORAL | Status: AC
  Filled 2016-02-10: qty 2

## 2016-02-10 MED ORDER — DEXAMETHASONE SODIUM PHOSPHATE 10 MG/ML IJ SOLN
INTRAMUSCULAR | Status: AC
Start: 2016-02-10 — End: 2016-02-10
  Filled 2016-02-10: qty 1

## 2016-02-10 MED ORDER — SUGAMMADEX SODIUM 200 MG/2ML IV SOLN
INTRAVENOUS | Status: AC
Start: 2016-02-10 — End: 2016-02-10
  Filled 2016-02-10: qty 2

## 2016-02-10 MED ORDER — SODIUM CHLORIDE 0.9 % IV SOLN: 500.0000 mL | Freq: Once | INTRAVENOUS | Status: DC | PRN

## 2016-02-10 MED ORDER — FAMOTIDINE IN NACL 20-0.9 MG/50ML-% IV SOLN
20.0000 mg | Freq: Two times a day (BID) | INTRAVENOUS | Status: DC
Start: 2016-02-10 — End: 2016-02-11
  Administered 2016-02-10 – 2016-02-11 (×3): 20 mg via INTRAVENOUS
  Filled 2016-02-10 (×3): qty 50

## 2016-02-10 MED ORDER — GUAIFENESIN-DM 100-10 MG/5ML PO SYRP
15.0000 mL | ORAL_SOLUTION | ORAL | Status: DC | PRN
  Administered 2016-02-10: 15 mL via ORAL
  Filled 2016-02-10: qty 15

## 2016-02-10 MED ORDER — PROPOFOL 10 MG/ML IV BOLUS
INTRAVENOUS | Status: AC
  Filled 2016-02-10: qty 20

## 2016-02-10 MED ORDER — ONDANSETRON HCL 4 MG/2ML IJ SOLN: 4.0000 mg | Freq: Four times a day (QID) | INTRAMUSCULAR | Status: DC | PRN

## 2016-02-10 MED ORDER — HYDROCHLOROTHIAZIDE 25 MG PO TABS
25.0000 mg | ORAL_TABLET | Freq: Every day | ORAL | Status: DC
  Administered 2016-02-10 – 2016-02-11 (×2): 25 mg via ORAL
  Filled 2016-02-10 (×2): qty 1

## 2016-02-10 MED ORDER — PHENOL 1.4 % MT LIQD
1.0000 | OROMUCOSAL | Status: DC | PRN
  Filled 2016-02-10: qty 177

## 2016-02-10 MED ORDER — LISINOPRIL 20 MG PO TABS
20.0000 mg | ORAL_TABLET | Freq: Every day | ORAL | Status: DC
  Administered 2016-02-10 – 2016-02-11 (×2): 20 mg via ORAL
  Filled 2016-02-10 (×2): qty 1

## 2016-02-10 MED ORDER — FENTANYL CITRATE (PF) 100 MCG/2ML IJ SOLN
INTRAMUSCULAR | Status: DC | PRN
  Administered 2016-02-10 (×4): 50 ug via INTRAVENOUS

## 2016-02-10 MED ORDER — DEXAMETHASONE SODIUM PHOSPHATE 10 MG/ML IJ SOLN
INTRAMUSCULAR | Status: DC | PRN
  Administered 2016-02-10: 10 mg via INTRAVENOUS

## 2016-02-10 MED ORDER — CEFAZOLIN IN D5W 1 GM/50ML IV SOLN
INTRAVENOUS | Status: AC
  Filled 2016-02-10: qty 100

## 2016-02-10 MED ORDER — ROCURONIUM BROMIDE 100 MG/10ML IV SOLN
INTRAVENOUS | Status: DC | PRN
  Administered 2016-02-10: 10 mg via INTRAVENOUS
  Administered 2016-02-10: 50 mg via INTRAVENOUS
  Administered 2016-02-10: 10 mg via INTRAVENOUS
  Administered 2016-02-10: 20 mg via INTRAVENOUS

## 2016-02-10 MED ORDER — MORPHINE SULFATE (PF) 4 MG/ML IV SOLN: 2.0000 mg | INTRAVENOUS | Status: DC | PRN

## 2016-02-10 MED ORDER — SODIUM CHLORIDE 0.9 % IV SOLN
INTRAVENOUS | Status: DC
  Administered 2016-02-10: 1000 mL via INTRAVENOUS

## 2016-02-10 MED ORDER — ATROPINE SULFATE 0.4 MG/ML IV SOSY
PREFILLED_SYRINGE | INTRAVENOUS | Status: AC
  Filled 2016-02-10: qty 2.5

## 2016-02-10 MED ORDER — DOCUSATE SODIUM 100 MG PO CAPS
100.0000 mg | ORAL_CAPSULE | Freq: Every day | ORAL | Status: DC
  Administered 2016-02-11: 100 mg via ORAL
  Filled 2016-02-10: qty 1

## 2016-02-10 MED ORDER — MIDAZOLAM HCL 2 MG/2ML IJ SOLN
INTRAMUSCULAR | Status: AC
  Filled 2016-02-10: qty 2

## 2016-02-10 MED ORDER — MIDAZOLAM HCL 2 MG/2ML IJ SOLN
INTRAMUSCULAR | Status: DC | PRN
Start: 2016-02-10 — End: 2016-02-10
  Administered 2016-02-10: 1 mg via INTRAVENOUS

## 2016-02-10 MED ORDER — LISINOPRIL-HYDROCHLOROTHIAZIDE 20-25 MG PO TABS: 1.0000 | ORAL_TABLET | Freq: Every day | ORAL | Status: DC

## 2016-02-10 MED ORDER — SODIUM CHLORIDE 0.9 % IV SOLN
INTRAVENOUS | Status: DC | PRN
  Administered 2016-02-10: 40 ug/min via INTRAVENOUS

## 2016-02-10 MED ORDER — METOPROLOL TARTRATE 5 MG/5ML IV SOLN: 2.0000 mg | INTRAVENOUS | Status: DC | PRN

## 2016-02-10 MED ORDER — CEFAZOLIN SODIUM-DEXTROSE 2-3 GM-% IV SOLR
2.0000 g | Freq: Once | INTRAVENOUS | Status: AC
  Administered 2016-02-10: 2 g via INTRAVENOUS
  Filled 2016-02-10: qty 50

## 2016-02-10 MED ORDER — FENTANYL CITRATE (PF) 100 MCG/2ML IJ SOLN
INTRAMUSCULAR | Status: AC
Start: 2016-02-10 — End: 2016-02-10
  Filled 2016-02-10: qty 2

## 2016-02-10 MED ORDER — PROMETHAZINE HCL 25 MG/ML IJ SOLN: 6.2500 mg | INTRAMUSCULAR | Status: DC | PRN

## 2016-02-10 MED ORDER — IOPAMIDOL (ISOVUE-300) INJECTION 61%
INTRAVENOUS | Status: DC | PRN
  Administered 2016-02-10: 90 mL via INTRAVENOUS

## 2016-02-10 MED ORDER — CLOPIDOGREL BISULFATE 75 MG PO TABS
75.0000 mg | ORAL_TABLET | Freq: Every day | ORAL | Status: DC
  Administered 2016-02-11: 75 mg via ORAL
  Filled 2016-02-10 (×2): qty 1

## 2016-02-10 MED ORDER — NITROGLYCERIN IN D5W 200-5 MCG/ML-% IV SOLN
5.0000 ug/min | INTRAVENOUS | Status: DC
  Administered 2016-02-10: 5 ug/min via INTRAVENOUS
  Filled 2016-02-10: qty 250

## 2016-02-10 MED ORDER — FENTANYL CITRATE (PF) 100 MCG/2ML IJ SOLN
25.0000 ug | INTRAMUSCULAR | Status: DC | PRN
  Administered 2016-02-10 (×3): 50 ug via INTRAVENOUS

## 2016-02-10 MED ORDER — LACTATED RINGERS IV SOLN
INTRAVENOUS | Status: DC | PRN
  Administered 2016-02-10: 08:00:00 via INTRAVENOUS

## 2016-02-10 MED ORDER — SUGAMMADEX SODIUM 200 MG/2ML IV SOLN
INTRAVENOUS | Status: DC | PRN
  Administered 2016-02-10: 200 mg via INTRAVENOUS

## 2016-02-10 MED ORDER — ONDANSETRON HCL 4 MG/2ML IJ SOLN
INTRAMUSCULAR | Status: AC
Start: 2016-02-10 — End: 2016-02-10
  Filled 2016-02-10: qty 2

## 2016-02-10 MED ORDER — OXYCODONE HCL 5 MG/5ML PO SOLN: 5.0000 mg | Freq: Once | ORAL | Status: DC | PRN

## 2016-02-10 MED ORDER — MEPERIDINE HCL 25 MG/ML IJ SOLN: 6.2500 mg | INTRAMUSCULAR | Status: DC | PRN

## 2016-02-10 MED ORDER — MAGNESIUM SULFATE 2 GM/50ML IV SOLN
2.0000 g | Freq: Every day | INTRAVENOUS | Status: DC | PRN
  Filled 2016-02-10: qty 50

## 2016-02-10 MED ORDER — ACETAMINOPHEN 325 MG PO TABS: 325.0000 mg | ORAL_TABLET | ORAL | Status: DC | PRN

## 2016-02-10 MED ORDER — HEPARIN SODIUM (PORCINE) 1000 UNIT/ML IJ SOLN
INTRAMUSCULAR | Status: DC | PRN
  Administered 2016-02-10: 6000 [IU] via INTRAVENOUS
  Administered 2016-02-10: 2000 [IU] via INTRAVENOUS

## 2016-02-10 MED ORDER — DEXTROSE 5 % IV SOLN: 2000.0000 mg | Freq: Once | INTRAVENOUS | Status: DC

## 2016-02-10 MED ORDER — ATORVASTATIN CALCIUM 20 MG PO TABS
80.0000 mg | ORAL_TABLET | Freq: Every day | ORAL | Status: DC
  Administered 2016-02-10: 80 mg via ORAL
  Filled 2016-02-10: qty 4

## 2016-02-10 MED ORDER — ASPIRIN EC 81 MG PO TBEC
81.0000 mg | DELAYED_RELEASE_TABLET | Freq: Every day | ORAL | Status: DC
  Administered 2016-02-10 – 2016-02-11 (×2): 81 mg via ORAL
  Filled 2016-02-10 (×2): qty 1

## 2016-02-10 MED ORDER — HYDRALAZINE HCL 20 MG/ML IJ SOLN: 5.0000 mg | INTRAMUSCULAR | Status: DC | PRN

## 2016-02-10 MED ORDER — METOPROLOL TARTRATE 25 MG PO TABS
25.0000 mg | ORAL_TABLET | Freq: Two times a day (BID) | ORAL | Status: DC
  Administered 2016-02-10 – 2016-02-11 (×2): 25 mg via ORAL
  Filled 2016-02-10 (×2): qty 1

## 2016-02-10 MED ORDER — ACETAMINOPHEN 650 MG RE SUPP: 325.0000 mg | RECTAL | Status: DC | PRN

## 2016-02-10 MED ORDER — OXYCODONE-ACETAMINOPHEN 5-325 MG PO TABS
1.0000 | ORAL_TABLET | ORAL | Status: DC | PRN
  Administered 2016-02-10 (×2): 2 via ORAL
  Filled 2016-02-10 (×2): qty 2

## 2016-02-10 MED ORDER — OXYCODONE HCL 5 MG PO TABS: 5.0000 mg | ORAL_TABLET | Freq: Once | ORAL | Status: DC | PRN

## 2016-02-10 MED ORDER — DEXTROSE 5 % IV SOLN
1.5000 g | Freq: Two times a day (BID) | INTRAVENOUS | Status: AC
  Administered 2016-02-10 – 2016-02-11 (×2): 1.5 g via INTRAVENOUS
  Filled 2016-02-10 (×2): qty 1.5

## 2016-02-10 MED ORDER — FLUTICASONE FUROATE-VILANTEROL 100-25 MCG/INH IN AEPB
1.0000 | INHALATION_SPRAY | Freq: Every day | RESPIRATORY_TRACT | Status: DC
  Administered 2016-02-10 – 2016-02-11 (×2): 1 via RESPIRATORY_TRACT
  Filled 2016-02-10 (×2): qty 28

## 2016-02-10 MED ORDER — ROCURONIUM BROMIDE 50 MG/5ML IV SOSY
PREFILLED_SYRINGE | INTRAVENOUS | Status: AC
  Filled 2016-02-10: qty 10

## 2016-02-10 MED ORDER — NITROGLYCERIN 0.4 MG SL SUBL: 0.4000 mg | SUBLINGUAL_TABLET | SUBLINGUAL | Status: DC | PRN

## 2016-02-10 MED ORDER — LABETALOL HCL 5 MG/ML IV SOLN
10.0000 mg | INTRAVENOUS | Status: DC | PRN
  Administered 2016-02-10: 10 mg via INTRAVENOUS

## 2016-02-10 MED ORDER — PROPOFOL 10 MG/ML IV BOLUS
INTRAVENOUS | Status: DC | PRN
  Administered 2016-02-10: 100 mg via INTRAVENOUS

## 2016-02-10 MED ORDER — MONTELUKAST SODIUM 10 MG PO TABS
10.0000 mg | ORAL_TABLET | Freq: Every day | ORAL | Status: DC
  Administered 2016-02-10: 10 mg via ORAL
  Filled 2016-02-10: qty 1

## 2016-02-10 SURGICAL SUPPLY — 77 items
BALLN ARMADA 14X40X80 (BALLOONS) ×3
BALLN ULTRVRSE  6X40X75C (BALLOONS) ×2
BALLN ULTRVRSE 10X40X75 (BALLOONS) ×3
BALLN ULTRVRSE 6X40X75C (BALLOONS) ×1
BALLN ULTRVRSE 8X40X75C (BALLOONS) ×3
BLADE SURG 15 STRL LF DISP TIS (BLADE) ×1
BLADE SURG 15 STRL SS (BLADE) ×2
BLADE SURG SZ11 CARB STEEL (BLADE) ×3
BOOT SUTURE AID YELLOW STND (SUTURE) ×3
BRUSH SCRUB 4% CHG (MISCELLANEOUS) ×3
CATH ACCU-VU SIZ PIG 5F 70CM (CATHETERS) ×3
CATH BALLN CODA 9X100X32 (BALLOONS) ×3
CATH BEACON 5.038 65CM KMP-01 (CATHETERS) ×3
CATH C2 65CM (CATHETERS) ×3
CATH CXI 4F 90 DAV (CATHETERS) ×3
CATH CXI SUPP ST 4FR 135CM (MICROCATHETER) ×3
DERMABOND ADVANCED (GAUZE/BANDAGES/DRESSINGS) ×2
DERMABOND ADVANCED .7 DNX12 (GAUZE/BANDAGES/DRESSINGS) ×1
DEVICE PRESTO INFLATION (MISCELLANEOUS) ×12
DEVICE SAFEGUARD 24CM (GAUZE/BANDAGES/DRESSINGS) ×6
DEVICE TORQUE (MISCELLANEOUS) ×6
DRAPE INCISE IOBAN 66X45 STRL (DRAPES) ×3
DRYSEAL FLEXSHEATH 12FR, 33CM (SHEATH) ×3
DRYSEAL FLEXSHEATH 16FR 33CM (SHEATH) ×2
DRYSEAL FLEXSHEATH 18FR 33CM (SHEATH) ×2
ELECT CAUTERY BLADE 6.4 (BLADE) ×3
ELECT REM PT RETURN 9FT ADLT (ELECTROSURGICAL) ×3
ENDOPRO ILIAC 23X12X10 FA (Endovascular Graft) ×3 IMPLANT
ENDOPRO ILIAC HC 16X14X7 FA (Endovascular Graft) IMPLANT
ENSNARE 18-30 (MISCELLANEOUS) ×3
EXCLUDER TRUNK 28.5X14.5X18CM (Endovascular Graft) ×3 IMPLANT
GLIDECATH NONTAPER ANGL 5FR (CATHETERS) ×3
GLIDEWIRE ANGLED SS 035X260CM (WIRE) ×3
GLIDEWIRE STIFF .35X180X3 HYDR (WIRE) ×3
GLOVE BIO SURGEON STRL SZ7 (GLOVE) ×6
GLOVE BIO SURGEON STRL SZ8 (GLOVE) ×6
GOWN STRL REUS W/ TWL LRG LVL3 (GOWN DISPOSABLE) ×1
GOWN STRL REUS W/ TWL XL LVL3 (GOWN DISPOSABLE) ×2
GOWN STRL REUS W/TWL LRG LVL3 (GOWN DISPOSABLE) ×2
GOWN STRL REUS W/TWL XL LVL3 (GOWN DISPOSABLE) ×4
GRAFT EXCLUDER LEG (Endovascular Graft) ×3 IMPLANT
GUIDEWIRE AMPLATZ SS .035X260 (WIRE) ×3
GUIDEWIRE ZIPWIRE .035X180CM (WIRE) ×3
HEMOSTAT SURGICEL 2X3 (HEMOSTASIS) ×3
LEG CONTRALATERAL 27X12 (Vascular Products) ×3 IMPLANT
LOOP RED MAXI  1X406MM (MISCELLANEOUS) ×2
LOOP VESSEL MAXI 1X406 RED (MISCELLANEOUS) ×1
LOOP VESSEL MINI 0.8X406 BLUE (MISCELLANEOUS) ×1
LOOPS BLUE MINI 0.8X406MM (MISCELLANEOUS) ×2
NEEDLE ENTRY 21GA 7CM ECHOTIP (NEEDLE) ×3
PACK ANGIOGRAPHY (CUSTOM PROCEDURE TRAY) ×6
PACK BASIN MAJOR ARMC (MISCELLANEOUS) ×3
PERCLOSE PROGLIDE 6F (VASCULAR PRODUCTS) ×12
SET INTRO CAPELLA COAXIAL (SET/KITS/TRAYS/PACK) ×3
SHEATH 9FRX11 (SHEATH) ×6
SHEATH BRITE TIP 5FRX11 (SHEATH) ×3
SHEATH BRITE TIP 6FRX11 (SHEATH) ×3
SHEATH DRYSEAL FLEX 16FR 33CM (SHEATH) ×1
SHEATH DRYSEAL FLEX 18FR 33CM (SHEATH) ×1
SPONGE XRAY 4X4 16PLY STRL (MISCELLANEOUS) ×21
STENT VIABAHN 13X50X120 (Permanent Stent) ×3 IMPLANT
SUT MNCRL 4-0 (SUTURE) ×2
SUT MNCRL 4-0 27XMFL (SUTURE) ×1
SUT PROLENE 6 0 BV (SUTURE) ×12
SUT SILK 2 0 (SUTURE) ×2
SUT SILK 2-0 18XBRD TIE 12 (SUTURE) ×1
SUT SILK 3 0 (SUTURE) ×2
SUT SILK 3-0 18XBRD TIE 12 (SUTURE) ×1
SUT SILK 4 0 (SUTURE) ×2
SUT SILK 4-0 18XBRD TIE 12 (SUTURE) ×1
SUT VIC AB 3-0 SH 27 (SUTURE) ×2
SUT VIC AB 3-0 SH 27X BRD (SUTURE) ×1
SYR 20CC LL (SYRINGE) ×3
TUBING CONTRAST HIGH PRESS 72 (TUBING) ×3
WIRE AMPLATZ SSTIFF .035X260CM (WIRE) ×12
WIRE J 3MM .035X145CM (WIRE) ×6
WIRE SPARTACORE .014X300CM (WIRE) ×3

## 2016-02-10 NOTE — Op Note (Signed)
OPERATIVE NOTE   PROCEDURE: 1. US guidance for vascular access, bilateral femoral arteries 2. Catheter placement into aorta from bilateral femoral approaches 3. Catheter placement into tertiary left hypogastric artery branches from right femoral approach 4. Placement of a 28 mm diameter 18 cm length Gore Excluder Endoprosthesis main body right with a 27 mm diameter by 12 cm length contralateral limb to bridge to the bifurcated device in the left iliac artery 5. Placement of a 23 mm diameter proximal 12 mm diameter distal 10 cm in length bifurcated device in the left common iliac artery extending down into the left external iliac artery in the proximal left internal iliac artery 6. Placement of a 13 mm diameter by 5 cm length Viabahn stent into the left hypogastric artery 7. Placement of a 14 mm diameter by 10 cm length extension limb into the right external iliac artery out 8. ProGlide closure devices bilateral femoral arteries  PRE-OPERATIVE DIAGNOSIS: AAA, bilateral iliac artery branches  POST-OPERATIVE DIAGNOSIS: same  SURGEON: Leotis Pain, MD and Hortencia Pilar, MD - Co-surgeons  ANESTHESIA: General  ESTIMATED BLOOD LOSS: 150 cc  FINDING(S): 1.  AAA  SPECIMEN(S):  none  INDICATIONS:   Jacob Ford is a 76 y.o. 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 The anatomy was suitable for endovascular repair but would require extensive treatment to maintain pelvic blood flow on the left side and require coil embolization on the right side which had been already done. We would have to extend into both external iliac artery secondary to aneurysmal disease continuing throughout the iliac vessels to the bifurcation and a branch device was planned to maintain flow to the left hypogastric artery as well.  Risks and benefits of repair in an endovascular fashion were discussed and informed consent was  obtained.  DESCRIPTION: After obtaining full informed written consent, the patient was brought back to the operating room and placed supine upon the operating table.  The patient received IV antibiotics prior to induction.  After obtaining adequate anesthesia, the patient was prepped and draped in the standard fashion for endovascular AAA repair.  We then began by gaining access to both femoral arteries with US guidance with me working on the right and Dr. Delana Meyer working on the left.  The femoral arteries were found to be patent and accessed without difficulty with a needle under ultrasound guidance without difficulty on each side and permanent images were recorded.  We then placed 2 proglide devices on each side in a pre-close fashion and placed 8 French sheaths. The patient was then given 6000 units of intravenous heparin and later an additional 2000 units of heparin were given. We started by getting ready to place our branch device in the left common iliac artery. A stiff wire was placed up the left and a 16 French sheath was placed through the left femoral artery. Oblique arteriogram was performed to help splay out the hypogastric artery origin. A snare was placed up the right side and into the aorta and a Glidewire was snared from the right side after being advanced through the left femoral sheath and then brought out the right femoral sheath for a through and through wire. We then upsized to a 12 [place] sheath on the right side and advanced this up and over the aortic bifurcation and down into the left common iliac artery. A 23 mm diameter bifurcated device was then deployed in the left common  iliac artery with a 12 mm diameter by 10 cm length left external iliac artery portion that went down about 5-6 cm into the left external iliac artery. I then used a C2 catheter and a stiff angle Glidewire and through the hypogastric artery portion of the bifurcated iliac device advanced down into the left hypogastric  artery. I then took the Glidewire and the C2 catheter out beyond the primary and secondary branches and into a tertiary branch of the left hypogastric artery confirming intraluminal flow before exchanging for the stiff wire. There was some constraint and an 8 mm diameter by 4 cm length balloon was used to balloon open the gate. The 14 mm diameter by 7 cm length iliac extension limb was then found to be too long and would be beyond the initial branches of the hypogastric artery limiting flow. There is also stenosis in the secondary hypogastric artery continuation that appeared to be greater than 75%. Over the Amplatz Super Stiff wire and through the bifurcated device into the left hypogastric artery we advanced a 13 mm diameter by 5 cm length Viabahn stent. This was deployed in the left hypogastric artery bridging up into the bifurcated device in the left iliac artery with appropriate overlap. This terminated just before the initial branches of the hypogastric artery. When the secondary hypogastric artery branch of then took a 6 mm diameter by 2 cm length angioplasty balloon to treat the stenosis in the left hypogastric artery to help promote outflow. This balloon was inflated to 10 atm for 1 minute completion tangential gram showed only about a 20-30% residual stenosis. There was some constraint in the left external iliac artery portion of the device and a 10 mm diameter by 4 cm length noncompliant balloon was then used to open up the stent graft with good results. We were pleased with the appearance of the left common iliac artery bifurcated graft and the extensions into the left hypogastric artery and left external iliac artery at this point. The Pigtail catheter was placed into the aorta from the left side. Using this image, we selected a 28 mm diameter by 18 cm length Main body device.  The 12 French sheath was then removed from the right side. Over a stiff wire, an 40 French sheath was placed up the right side.  The main body was then placed through the 18 French sheath. A Kumpe catheter was placed up the left side and a magnified image at the renal arteries was performed. The main body was then deployed just below the lowest renal artery which was the left renal artery. The Kumpe catheter was used to cannulate the contralateral gate without difficulty and successful cannulation was confirmed by twirling the pigtail catheter in the main body. We then placed a stiff wire and a retrograde arteriogram was performed through the left femoral sheath.  Through the 16 French sheath in the left femoral artery was placed the contralateral limb and a 27 mm diameter by 12 cm length limb was selected and deployed. This would bridge between the main body in the aorta and the bifurcated device in the left common iliac artery. The main body deployment was then completed. Based off the angiographic findings, extension limbs were necessary as the longest main body device got just to the iliac bifurcation on the right and we needed to get down into the external iliac artery for appropriate seal. The right hypogastric artery had already been coil embolized. A 14 mm diameter x 10 cm length  right iliac extension limb was then deployed into the right external iliac artery about 5 cm beyond its origin. All junction points and seals zones were treated with the compliant balloon. The pigtail catheter was then replaced and a completion angiogram was performed.  No Endoleak was detected on completion angiography. The renal arteries were found to be widely patent. The left hypogastric artery stent and angioplasty were patent with good flow.  The extensions into the external iliac arteries were patent with good flow. At this point we elected to terminate the procedure. We secured the pro glide devices for hemostasis on the femoral arteries. The skin incision was closed with a 4-0 Monocryl. Dermabond and pressure dressing were placed. The patient was  taken to the recovery room in stable condition having tolerated the procedure well.  COMPLICATIONS: none  CONDITION: stable  Leotis Pain  02/10/2016, 11:00 AM   This note was created with Dragon Medical transcription system. Any errors in dictation are purely unintentional.

## 2016-02-10 NOTE — Progress Notes (Signed)
   02/10/16 1300  Vitals  Temp 98.5 F (36.9 C)  Temp Source Oral  BP (!) 167/87  MAP (mmHg) 109  Pulse Rate 73  ECG Heart Rate 73  Resp 15  Oxygen Therapy  SpO2 98 %  O2 Device Nasal Cannula  O2 Flow Rate (L/min) 2 L/min  Pulse Oximetry Type Continuous  Pain Assessment  Pain Assessment No/denies pain  Glasgow Coma Scale  Eye Opening 4  Best Verbal Response (NON-intubated) 5  Best Motor Response 6  Glasgow Coma Scale Score 15  Height and Weight  Height 6\' 2"  (1.88 m)  Weight 95.2 kg (209 lb 14.1 oz)  Type of Scale Used Bed  Type of Weight Actual  Drug Calculation Weight 95.2 kg (209 lb 14.1 oz)  BSA (Calculated - sq m) 2.23 sq meters  BMI (Calculated) 27  Weight in (lb) to have BMI = 25 194.3    Admission to unit

## 2016-02-10 NOTE — Anesthesia Post-op Follow-up Note (Cosign Needed)
Anesthesia QCDR form completed.        

## 2016-02-10 NOTE — Anesthesia Preprocedure Evaluation (Signed)
Anesthesia Evaluation  Patient identified by MRN, date of birth, ID band Patient awake    Reviewed: Allergy & Precautions, NPO status , Patient's Chart, lab work & pertinent test results  History of Anesthesia Complications Negative for: history of anesthetic complications  Airway Mallampati: II  TM Distance: >3 FB Neck ROM: Full    Dental  (+) Missing, Poor Dentition   Pulmonary shortness of breath and with exertion, COPD,  COPD inhaler, Current Smoker,    breath sounds clear to auscultation- rhonchi (-) wheezing      Cardiovascular hypertension, Pt. on medications (-) angina+ CAD, + Past MI, + Cardiac Stents (2016) and + Peripheral Vascular Disease   Rhythm:Regular Rate:Normal - Systolic murmurs and - Diastolic murmurs L heart cath 16/10/9609/28/16:  Prox RCA to Mid RCA lesion, 40% stenosed.  Ost RPDA lesion, 80% stenosed.  Post Atrio lesion, 60% stenosed.  1st RPLB lesion, 95% stenosed.  Mid Cx lesion, 30% stenosed.  Ost 3rd Mrg to 3rd Mrg lesion, 50% stenosed.  Dist LAD lesion, 80% stenosed.  Prox LAD-2 lesion, 60% stenosed.  Prox LAD-1 lesion, 30% stenosed.  The left ventricular systolic function is normal.  Dist RCA lesion, 100% stenosed. Post intervention, there is a 0% residual stenosis.   Neuro/Psych negative neurological ROS  negative psych ROS   GI/Hepatic negative GI ROS, Neg liver ROS,   Endo/Other  negative endocrine ROSneg diabetes  Renal/GU negative Renal ROS     Musculoskeletal negative musculoskeletal ROS (+)   Abdominal (+) - obese,   Peds  Hematology negative hematology ROS (+)   Anesthesia Other Findings Past Medical History: No date: Asthma No date: CAD (coronary artery disease)     Comment: a. 2002 Cath: Nonobs dzs per patient;  b.               11/2014 NSTEMI/PCI: LM nl, LAD 30p, 60p, 80d,               D1/2/3 min irregs, RI min irregs, LCX, 6137m, OM1              min irregs, OM2  nl, OM3 50ost, RCA 40p, 100d               (2.25x18 Xience Alpine DES), EF 55-65%; c.               01/2015 MV: nl EF, no ischemia. No date: COPD (chronic obstructive pulmonary disease) (* No date: Cough     Comment: SMOKER. DENIES FEVER/WHEEZING/ COUGH               NONPRODUCTIVE No date: Dyspnea     Comment: chronic No date: Hyperlipidemia No date: Hypertension No date: Hypertensive heart disease No date: Myocardial infarction     Comment: 2016 No date: Peripheral vascular disease (HCC) No date: Tobacco abuse No date: Tobacco abuse   Reproductive/Obstetrics                             Anesthesia Physical Anesthesia Plan  ASA: III  Anesthesia Plan: General   Post-op Pain Management:    Induction: Intravenous  Airway Management Planned: Oral ETT  Additional Equipment:   Intra-op Plan:   Post-operative Plan: Extubation in OR  Informed Consent: I have reviewed the patients History and Physical, chart, labs and discussed the procedure including the risks, benefits and alternatives for the proposed anesthesia with the patient or authorized representative who has indicated his/her understanding and acceptance.  Dental advisory given  Plan Discussed with: CRNA and Anesthesiologist  Anesthesia Plan Comments:         Lab Results  Component Value Date   WBC 12.8 (H) 01/20/2016   HGB 12.1 (L) 01/20/2016   HCT 37.6 (L) 01/20/2016   MCV 82.4 01/20/2016   PLT 227 01/20/2016    Anesthesia Quick Evaluation

## 2016-02-10 NOTE — Op Note (Signed)
OPERATIVE NOTE   PROCEDURE: 1. US guidance for vascular access, bilateral femoral arteries 2. Catheter placement into aorta from bilateral femoral approaches 3. Catheter placement into tertiary branches of the left internal iliac artery right femoral approach 4. Placement of a C3 Gore Excluder Endoprosthesis main body 28 x 18 with a 27 x 12 contralateral limb 5. Placement of a C3 Gore Excluder bifurcated iliac segment 23 x 12 x 10 which is deployed into the left external iliac artery 6. Placement of a Viabahn 13 mm x 5 cm Viabahn stent into the left hypogastric artery 7. Placement of a 14 mm x 10 cm extender limb right external iliac artery 8. Pro glide closures utilized in a pre-close fashion bilateral common femoral arteries  PRE-OPERATIVE DIAGNOSIS: AAA  POST-OPERATIVE DIAGNOSIS: same  SURGEON: Jacob DredgeGregory Dawn Kiper, MD and Jacob BarrenJason Dew, MD - Co-surgeons  ANESTHESIA: general  ESTIMATED BLOOD LOSS: 150 cc  FINDING(S): 1.  AAA  SPECIMEN(S):  none  INDICATIONS:   Jacob Ford is a 76 y.o. y.o. 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 He will require endovascular repair to prevent lethal rupture.  DESCRIPTION: After obtaining full informed written consent, the patient was brought back to the operating room and placed supine upon the operating table.  The patient received IV antibiotics prior to induction.  After obtaining adequate anesthesia, the patient was prepped and draped in the standard fashion for endovascular AAA repair.  We then began by gaining access to both femoral arteries with US guidance with me working on the left and Dr. Wyn Ford working on the right.  The femoral arteries were found to be patent and accessed without difficulty with a needle under ultrasound guidance without difficulty on each side and permanent images were recorded.  We then placed 2 proglide devices on each side in a pre-close fashion and placed 8 French  sheaths.  The patient was then given  6000 units of intravenous heparin.   The Pigtail catheter was placed into the aorta from the  left side. Image was then obtained demonstrating the left iliac bifurcation. Stiff Amplatz wire was then advanced up both the right and left sides. 16 French sheath was then advanced up the left side and the 8 French sheath was exchanged for a 12 French sheath up the right side. Subsequently a Glidewire was advanced up the left side and snared by Dr. Wyn Ford on the right side and pulled extracorporeally. Using this wire which now is traveling from the left groin over the aortic bifurcation to the right groin the 12 French sheath is advanced and positioned just proximal to the iliac bifurcation. The 23 mm x 12 mm x 10 cm iliac bifurcation device was then prepped on the back table and advanced through the 16 French sheath above the iliac bifurcation. The proximal portion was then opened and the right groin sheath was advanced into the main portion of the iliac bifurcation device. Dr. Wyn Ford then used a combination of catheters including a KMP as well as a C2 catheter and a Glidewire to negotiate the left internal iliac artery. Catheter was then introduced down into the tertiary branches hand injection contrast was used to localize the first major bifurcation as well as the distal anatomy. Subsequently an Amplatz Super Stiff wire was advanced through the catheter and the catheter removed. Subsequently the 13 mm x 5 cm Viabahn stent was deployed bridging the iliac bifurcation device with the left internal  iliac device. Angioplasty balloon was then advanced up both the right and left side and simultaneously inflated using a 10 mm balloon in the left external iliac and a 14 mm balloon in the left internal iliac. A 6 x 4 balloon was advanced out farther into the internal iliac and angioplasty was performed of a stenosis in the outflow tract of the iliac artery. Once this had been achieved the  crossing wire was removed the right-sided sheath was pulled back into the right common iliac and an stiff angle Glidewire was advanced under fluoroscopic guidance into the descending thoracic aorta. The 12 French sheath on the right was then exchanged for a 16 [place] sheath.   Pigtail catheter was then reintroduced and angiography of the abdominal aorta was obtained localized in the renal arteries. Using this image, we selected a 28 x 18 Main body device.  Over a stiff wire, an 17 French sheath was placed. The main body was then placed through the 18 French sheath. A Kumpe catheter was placed up the left side and a magnified image at the renal arteries was performed. The main body was then deployed just below the lowest renal artery. The Kumpe catheter was used to cannulate the contralateral gate without difficulty and successful cannulation was confirmed by twirling the pigtail catheter in the main body. We then placed a stiff wire and a retrograde arteriogram was performed through the left femoral sheath. We upsized to the 12 [place] sheath for the contralateral limb and a 27 x 12 limb was selected and deployed. The main body deployment was then completed. Based off the angiographic findings, extension limbs were necessary.  A 14 x 10 extender limb was then advanced up the right side and deployed. This was postdilated with a 10 mm antroplasty balloon as well. . All remaining junction points and seals zones were treated with the compliant balloon.   The pigtail catheter was then replaced and a completion angiogram was performed.   No Endoleak was detected on completion angiography. The renal arteries were found to be widely patent.   At this point we elected to terminate the procedure. We secured the pro glide devices for hemostasis on the femoral arteries. The skin incision was closed with a 4-0 Monocryl. Dermabond and pressure dressing were placed. The patient was taken to the recovery room in stable  condition having tolerated the procedure well.  COMPLICATIONS: none  CONDITION: stable  Jacob Ford  05/17/2014, 3:51 PM

## 2016-02-10 NOTE — Progress Notes (Addendum)
   02/10/16 1645  Vitals  BP (!) 183/77  MAP (mmHg) 108  Pulse Rate 75  ECG Heart Rate 80  Resp 16  Oxygen Therapy  SpO2 100 %     Nitro drip 15mcg

## 2016-02-10 NOTE — OR Nursing (Signed)
On patient's arrival, c/o extreme pain in mid back at his belt line.  Dr. Wyn Quakerew made aware and in to check the [phone]\

## 2016-02-10 NOTE — Anesthesia Procedure Notes (Signed)
Procedure Name: Intubation Date/Time: 02/10/2016 8:05 AM Performed by: Darlyne Russian Pre-anesthesia Checklist: Patient identified, Emergency Drugs available, Suction available, Patient being monitored and Timeout performed Patient Re-evaluated:Patient Re-evaluated prior to inductionOxygen Delivery Method: Circle system utilized Preoxygenation: Pre-oxygenation with 100% oxygen Intubation Type: IV induction Ventilation: Mask ventilation without difficulty Laryngoscope Size: McGraph and 4 (no view x 2 with MAC 4, changed to [place] with good view of cords.) Grade View: Grade I Tube type: Oral Tube size: 7.5 mm Number of attempts: 3 Airway Equipment and Method: Stylet and Video-laryngoscopy Placement Confirmation: ETT inserted through vocal cords under direct vision,  positive ETCO2 and breath sounds checked- equal and bilateral Secured at: 22 cm Tube secured with: Tape Dental Injury: Teeth and Oropharynx as per pre-operative assessment  Difficulty Due To: Difficulty was unanticipated and Difficult Airway- due to anterior larynx Future Recommendations: Recommend- induction with short-acting agent, and alternative techniques readily available

## 2016-02-10 NOTE — Transfer of Care (Signed)
Immediate Anesthesia Transfer of Care Note  Patient: Jacob Ford  Procedure(s) Performed: Procedure(s): Endovascular Repair/Stent Graft (N/A)  Patient Location: PACU  Anesthesia Type:General  Level of Consciousness: awake, alert , oriented and patient cooperative  Airway & Oxygen Therapy: Patient Spontanous Breathing and Patient connected to nasal cannula oxygen  Post-op Assessment: Report given to RN, Post -op Vital signs reviewed and stable and Patient moving all extremities X 4  Post vital signs: Reviewed and stable  Last Vitals:  Vitals:   02/10/16 0642 02/10/16 1123  BP: 140/85 (!) 157/84  Pulse: 76 72  Resp: 14 19  Temp: 36.7 C 36.3 C    Last Pain:  Vitals:   02/10/16 1123  TempSrc: Tympanic  PainSc: 6          Complications: No apparent anesthesia complications

## 2016-02-10 NOTE — H&P (Signed)
[facility] Admission History & Physical  MRN : [id number]  Jacob Ford is a 76 y.o. (1941-01-01) male who presents with chief complaint of No chief complaint on file. Jacob Ford.  History of Present Illness: Patient here for elective repair of his >5 cm AAA and bilateral iliac artery aneurysms. Had this delayed three weeks due to scrotal abscess that is now healed.  He continues to smoke.  He has no new complaints today.  Current Facility-Administered Medications  Medication Dose Route Frequency Provider Last Rate Last Dose  . ceFAZolin (ANCEF) IVPB 2 g/50 mL premix  2 g Intravenous Once Annice NeedyJason S Vanita Cannell, MD        Past Medical History:  Diagnosis Date  . Asthma   . CAD (coronary artery disease)    a. 2002 Cath: Nonobs dzs per patient;  b. 11/2014 NSTEMI/PCI: LM nl, LAD 30p, 60p, 80d, D1/2/3 min irregs, RI min irregs, LCX, 283m, OM1 min irregs, OM2 nl, OM3 50ost, RCA 40p, 100d (2.25x18 Xience Alpine DES), EF 55-65%; c. 01/2015 MV: nl EF, no ischemia.  Jacob Ford. COPD (chronic obstructive pulmonary disease) (HCC)   . Cough    SMOKER. DENIES FEVER/WHEEZING/ COUGH NONPRODUCTIVE  . Dyspnea    chronic  . Hyperlipidemia   . Hypertension   . Hypertensive heart disease   . Myocardial infarction    2016  . Peripheral vascular disease (HCC)   . Tobacco abuse   . Tobacco abuse     Past Surgical History:  Procedure Laterality Date  . CARDIAC CATHETERIZATION    . CARDIAC CATHETERIZATION Bilateral 12/08/2014   Procedure: Left Heart Cath and Coronary Angiography;  Surgeon: [name], MD;  Location: [facility] INVASIVE CV LAB;  Service: Cardiovascular;  Laterality: Bilateral;  . CARDIAC CATHETERIZATION N/A 12/08/2014   Procedure: Coronary Stent Intervention;  Surgeon: [name], MD;  Location: [facility] INVASIVE CV LAB;  Service: Cardiovascular;  Laterality: N/A;  . CORONARY ANGIOPLASTY     stent 2016  . PERIPHERAL VASCULAR CATHETERIZATION Right 01/18/2016   Procedure: Embolization;   Surgeon: Annice NeedyJason S Dwane Andres, MD;  Location: [facility] INVASIVE CV LAB;  Service: Cardiovascular;  Laterality: Right;  . SHOULDER SURGERY Left     Social History Social History  Substance Use Topics  . Smoking status: Current Every Day Smoker    Packs/day: 1.00    Types: Cigarettes  . Smokeless tobacco: Never Used  . Alcohol use No  No IVDU  Family History Family History  Problem Relation Age of Onset  . Hypertension Mother   . Hypertension Father   . CAD Brother   . CAD Brother   . Stroke Sister   . Stroke Sister     No Known Allergies   REVIEW OF SYSTEMS (Negative unless checked)  Constitutional: [] Weight loss  [] Fever  [] Chills Cardiac: [] Chest pain   [] Chest pressure   [] Palpitations   [] Shortness of breath when laying flat   [] Shortness of breath at rest   [] Shortness of breath with exertion. Vascular:  [] Pain in legs with walking   [] Pain in legs at rest   [] Pain in legs when laying flat   [] Claudication   [] Pain in feet when walking  [] Pain in feet at rest  [] Pain in feet when laying flat   [] History of DVT   [] Phlebitis   [] Swelling in legs   [] Varicose veins   [] Non-healing ulcers Pulmonary:   [] Uses home oxygen   [] Productive cough   [] Hemoptysis   [] Wheeze  [x] COPD   []   Asthma Neurologic:  [] Dizziness  [] Blackouts   [] Seizures   [] History of stroke   [] History of TIA  [] Aphasia   [] Temporary blindness   [] Dysphagia   [] Weakness or numbness in arms   [] Weakness or numbness in legs Musculoskeletal:  [] Arthritis   [] Joint swelling   [] Joint pain   [] Low back pain Hematologic:  [] Easy bruising  [] Easy bleeding   [] Hypercoagulable state   [] Anemic  [] Hepatitis Gastrointestinal:  [] Blood in stool   [] Vomiting blood  [] Gastroesophageal reflux/heartburn   [] Difficulty swallowing. Genitourinary:  [] Chronic kidney disease   [] Difficult urination  [] Frequent urination  [] Burning with urination   [] Blood in urine Skin:  [] Rashes   [] Ulcers   [] Wounds Psychological:  [] History of anxiety   []   History of major depression.  Physical Examination  Vitals:   02/10/16 0642  BP: 140/85  Pulse: 76  Resp: 14  Temp: 98 F (36.7 C)  TempSrc: Oral  SpO2: 94%  Weight: 96.2 kg (212 lb)  Height: 6\' 2"  (1.88 m)   Body mass index is 27.22 kg/m. Gen: WD/WN, NAD Head: [place]/AT, No temporalis wasting. Prominent temp pulse not noted. Ear/Nose/Throat: Hearing grossly intact, nares w/o erythema or drainage, oropharynx w/o Erythema/Exudate,  Eyes: Conjunctiva clear, sclera non-icteric Neck: Trachea midline.  No JVD.  Pulmonary:  Good air movement, respirations not labored, no use of accessory muscles.  Cardiac: RRR, normal S1, S2. Vascular:  Vessel Right Left  Radial Palpable Palpable                                   Gastrointestinal: soft, non-tender/non-distended. No guarding/reflex.  Musculoskeletal: M/S 5/5 throughout.  Extremities without ischemic changes.  No deformity or atrophy.  Neurologic: Sensation grossly intact in extremities.  Symmetrical.  Speech is fluent. Motor exam as listed above. Psychiatric: Judgment intact, Mood & affect appropriate for pt's clinical situation. Dermatologic: No rashes or ulcers noted.  No cellulitis or open wounds. Lymph : No Cervical, Axillary, or Inguinal lymphadenopathy.     CBC Lab Results  Component Value Date   WBC 12.8 (H) 01/20/2016   HGB 12.1 (L) 01/20/2016   HCT 37.6 (L) 01/20/2016   MCV 82.4 01/20/2016   PLT 227 01/20/2016    BMET    Component Value Date/Time   NA 135 01/20/2016 1046   NA 138 12/14/2012 0346   K 3.0 (L) 01/20/2016 1046   K 3.6 12/14/2012 0346   CL 95 (L) 01/20/2016 1046   CL 101 12/14/2012 0346   CO2 31 01/20/2016 1046   CO2 28 12/14/2012 0346   GLUCOSE 116 (H) 01/20/2016 1046   GLUCOSE 129 (H) 12/14/2012 0346   BUN 13 01/20/2016 1046   BUN 14 12/14/2012 0346   CREATININE 1.01 01/20/2016 1046   CREATININE 0.92 12/14/2012 0346   CALCIUM 8.8 (L) 01/20/2016 1046   CALCIUM 9.0 12/14/2012 0346    GFRNONAA >60 01/20/2016 1046   GFRNONAA >60 12/14/2012 0346   GFRAA >60 01/20/2016 1046   GFRAA >60 12/14/2012 0346   Estimated Creatinine Clearance: 73.5 mL/min (by C-G formula based on SCr of 1.01 mg/dL).  COAG Lab Results  Component Value Date   INR 1.08 01/15/2016   INR 1.07 12/07/2014   INR 1.0 12/13/2012    Radiology No results found.    Assessment/Plan 1. AAA. For endovascular repair.  Already had right hypo coiled.  Plan branch graft to left hypo if possible 2. HTN. Stable  on outpatient meds. 3. Tobacco dependence. A cause of his AAA. Needs to stop smoking 4. CAD. Stable on outpatient meds.   [name], MD  02/10/2016 7:43 AM

## 2016-02-11 ENCOUNTER — Encounter: Payer: Self-pay | Admitting: Vascular Surgery

## 2016-02-11 LAB — BASIC METABOLIC PANEL
Anion gap: 6 (ref 5–15)
BUN: 9 mg/dL (ref 6–20)
CALCIUM: 8.5 mg/dL — AB (ref 8.9–10.3)
CHLORIDE: 102 mmol/L (ref 101–111)
CO2: 30 mmol/L (ref 22–32)
CREATININE: 0.86 mg/dL (ref 0.61–1.24)
GFR calc non Af Amer: >60 mL/min (ref >60)
Glucose, Bld: 96 mg/dL (ref 65–99)
Potassium: 3.8 mmol/L (ref 3.5–5.1)
SODIUM: 138 mmol/L (ref 135–145)

## 2016-02-11 LAB — CBC
HCT: 33.2 % — ABNORMAL LOW (ref 40.0–52.0)
HEMOGLOBIN: 10.7 g/dL — AB (ref 13.0–18.0)
MCH: 26.3 pg (ref 26.0–34.0)
MCHC: 32.2 g/dL (ref 32.0–36.0)
MCV: 81.6 fL (ref 80.0–100.0)
Platelets: 189 10*3/uL (ref 150–440)
RBC: 4.07 MIL/uL — ABNORMAL LOW (ref 4.40–5.90)
RDW: 16.6 % — ABNORMAL HIGH (ref 11.5–14.5)
WBC: 10.9 10*3/uL — AB (ref 3.8–10.6)

## 2016-02-11 LAB — TYPE AND SCREEN
ABO/RH(D): A POS
ANTIBODY SCREEN: NEGATIVE
Unit division: 0
Unit division: 0

## 2016-02-11 LAB — PREPARE RBC (CROSSMATCH)

## 2016-02-11 MED ORDER — OXYCODONE-ACETAMINOPHEN 5-325 MG PO TABS: 1.0000 | ORAL_TABLET | ORAL | 0 refills | Status: DC | PRN

## 2016-02-11 NOTE — Progress Notes (Signed)
Pt discharged in stable condition. Pt provided discharge teaching. All belongings with patient. Pt escorted to car by this nurse. Full assessment in EPIC.

## 2016-02-11 NOTE — Discharge Summary (Signed)
[facility]    Discharge Summary    Patient ID:  Jacob Ford MRN: [id number] DOB/AGE: 1940/10/31 76 y.o.  Admit date: 02/10/2016 Discharge date: 02/11/2016 Date of Surgery: 02/10/2016 Surgeon: Surgeon(s): Annice Needy, MD [name], MD  Admission Diagnosis: AAA Repair Rescheduled from January 10th.  GORE Rep needed  Discharge Diagnoses:  AAA Repair Rescheduled from January 10th.  GORE Rep needed  Secondary Diagnoses: Past Medical History:  Diagnosis Date  . Asthma   . CAD (coronary artery disease)    a. 2002 Cath: Nonobs dzs per patient;  b. 11/2014 NSTEMI/PCI: LM nl, LAD 30p, 60p, 80d, D1/2/3 min irregs, RI min irregs, LCX, 18m, OM1 min irregs, OM2 nl, OM3 50ost, RCA 40p, 100d (2.25x18 Xience Alpine DES), EF 55-65%; c. 01/2015 MV: nl EF, no ischemia.  [name] COPD (chronic obstructive pulmonary disease) (HCC)   . Cough    SMOKER. DENIES FEVER/WHEEZING/ COUGH NONPRODUCTIVE  . Dyspnea    chronic  . Hyperlipidemia   . Hypertension   . Hypertensive heart disease   . Myocardial infarction    2016  . Peripheral vascular disease (HCC)   . Tobacco abuse   . Tobacco abuse     Procedure(s): Endovascular Repair/Stent Graft  Discharged Condition: good  HPI:  Patient here for endovascular AAA repair.  Hospital Course:  Jacob Ford is a 76 y.o. male is S/P Neither Procedure(s): Endovascular Repair/Stent Graft Extubated: POD # 0 Physical exam: AF/VSS.  Feet warm Post-op wounds clean, dry, intact or healing well Pt. Ambulating, voiding and taking PO diet without difficulty. Pt pain controlled with PO pain meds. Labs as below Complications:none  Consults:    Significant Diagnostic Studies: CBC Lab Results  Component Value Date   WBC 10.9 (H) 02/11/2016   HGB 10.7 (L) 02/11/2016   HCT 33.2 (L) 02/11/2016   MCV 81.6 02/11/2016   PLT 189 02/11/2016    BMET    Component Value Date/Time   NA 138 02/11/2016 0524   NA 138  12/14/2012 0346   K 3.8 02/11/2016 0524   K 3.6 12/14/2012 0346   CL 102 02/11/2016 0524   CL 101 12/14/2012 0346   CO2 30 02/11/2016 0524   CO2 28 12/14/2012 0346   GLUCOSE 96 02/11/2016 0524   GLUCOSE 129 (H) 12/14/2012 0346   BUN 9 02/11/2016 0524   BUN 14 12/14/2012 0346   CREATININE 0.86 02/11/2016 0524   CREATININE 0.92 12/14/2012 0346   CALCIUM 8.5 (L) 02/11/2016 0524   CALCIUM 9.0 12/14/2012 0346   GFRNONAA >60 02/11/2016 0524   GFRNONAA >60 12/14/2012 0346   GFRAA >60 02/11/2016 0524   GFRAA >60 12/14/2012 0346   COAG Lab Results  Component Value Date   INR 1.08 01/15/2016   INR 1.07 12/07/2014   INR 1.0 12/13/2012     Disposition:  Discharge to :Home Discharge Instructions    Call MD for:  redness, tenderness, or signs of infection (pain, swelling, bleeding, redness, odor or green/yellow discharge around incision site)    Complete by:  As directed    Call MD for:  severe or increased pain, loss or decreased feeling  in affected limb(s)    Complete by:  As directed    Call MD for:  temperature >100.5    Complete by:  As directed    Driving Restrictions    Complete by:  As directed    No driving for 48 hours   Lifting restrictions  Complete by:  As directed    No lifting for 48 hours   No dressing needed    Complete by:  As directed    Replace only if drainage present   Resume previous diet    Complete by:  As directed      Allergies as of 02/11/2016   No Known Allergies     Medication List    TAKE these medications   aspirin 81 MG EC tablet Take 1 tablet (81 mg total) by mouth daily.   atorvastatin 80 MG tablet Commonly known as:  LIPITOR Take 1 tablet (80 mg total) by mouth daily at 6 PM.   BREO ELLIPTA 100-25 MCG/INH Aepb Generic drug:  fluticasone furoate-vilanterol Inhale 1 puff into the lungs daily.   clopidogrel 75 MG tablet Commonly known as:  PLAVIX Take 1 tablet (75 mg total) by mouth daily.   lisinopril-hydrochlorothiazide  20-25 MG tablet Commonly known as:  PRINZIDE,ZESTORETIC Take 1 tablet by mouth daily.   metoprolol tartrate 25 MG tablet Commonly known as:  LOPRESSOR Take 1 tablet (25 mg total) by mouth 2 (two) times daily.   montelukast 10 MG tablet Commonly known as:  SINGULAIR Take 10 mg by mouth at bedtime.   nitroGLYCERIN 0.4 MG SL tablet Commonly known as:  NITROSTAT Place 1 tablet (0.4 mg total) under the tongue every 5 (five) minutes as needed for chest pain.   oxyCODONE-acetaminophen 5-325 MG tablet Commonly known as:  PERCOCET/ROXICET Take 1-2 tablets by mouth every 4 (four) hours as needed for moderate pain.      Verbal and written Discharge instructions given to the patient. Wound care per Discharge AVS Follow-up Information    KIMBERLY A STEGMAYER, PA-C Follow up in 3 week(s).   Specialty:  Physician Assistant Why:  with [facility] duplex Contact information: [address] [place] [address] [phone]           Signed: Festus BarrenJason Randell Detter, MD  02/11/2016, 2:09 PM

## 2016-02-11 NOTE — Progress Notes (Signed)
Dr.Dew paged to inform of patients BP being 169/90. Dr.Dew acknowledged and instructed this nurse to continue with discharge.

## 2016-02-11 NOTE — Progress Notes (Signed)
Pt BP remains elevated, unable to titrate Nitro drip down, Pt up to chair eating breakfast. No complaints of pain.Pt is alert and oriented. Vascular sites stable, level 0, no bleeding or oozing.  Will continue to assess.

## 2016-02-11 NOTE — Progress Notes (Signed)
[facility]  Daily Progress Note   Subjective  - 1 Day Post-Op  Doing quite well.  Sitting up in a chair. No major events overnight.  Objective Vitals:   02/11/16 1100 02/11/16 1200 02/11/16 1300 02/11/16 1400  BP: 138/74 (!) 147/98 116/76 (!) 163/138  Pulse: 92     Resp: 20 20 15  (!) 31  Temp:  98.3 F (36.8 C)    TempSrc:  Oral    SpO2: 95%     Weight:      Height:        Intake/Output Summary (Last 24 hours) at 02/11/16 1403 Last data filed at 02/11/16 1311  Gross per 24 hour  Intake          3154.94 ml  Output             2850 ml  Net           304.94 ml    PULM  CTAB CV  RRR VASC  Access sites C/d/I.  Feet warm.    Laboratory CBC    Component Value Date/Time   WBC 10.9 (H) 02/11/2016 0524   HGB 10.7 (L) 02/11/2016 0524   HGB 14.2 12/14/2012 0346   HCT 33.2 (L) 02/11/2016 0524   HCT 43.5 12/14/2012 0346   PLT 189 02/11/2016 0524   PLT 178 12/14/2012 0346    BMET    Component Value Date/Time   NA 138 02/11/2016 0524   NA 138 12/14/2012 0346   K 3.8 02/11/2016 0524   K 3.6 12/14/2012 0346   CL 102 02/11/2016 0524   CL 101 12/14/2012 0346   CO2 30 02/11/2016 0524   CO2 28 12/14/2012 0346   GLUCOSE 96 02/11/2016 0524   GLUCOSE 129 (H) 12/14/2012 0346   BUN 9 02/11/2016 0524   BUN 14 12/14/2012 0346   CREATININE 0.86 02/11/2016 0524   CREATININE 0.92 12/14/2012 0346   CALCIUM 8.5 (L) 02/11/2016 0524   CALCIUM 9.0 12/14/2012 0346   GFRNONAA >60 02/11/2016 0524   GFRNONAA >60 12/14/2012 0346   GFRAA >60 02/11/2016 0524   GFRAA >60 12/14/2012 0346    Assessment/Planning: POD #1  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  BP better now.  If able to come off of that and tolerate with BP less than 160s will discharge home this afternoon    [facility]  02/11/2016, 2:03 PM

## 2016-02-11 NOTE — Care Management (Signed)
Elective AAA repair. Denies issues accessing medical care, obtaining medications or with transportation.son to transport home.  Has life alert. Walking around nursing unit.  No needs identified by members of care team

## 2016-02-11 NOTE — Care Management Important Message (Signed)
Important Message  Patient Details  Name: Jacob Ford MRN: [id number] Date of Birth: [birth year]   Medicare Important Message Given:  Yes  Initial signed IM printed from Epic and given to patient.    Eber HongGreene, Clemma Johnsen R, RN 02/11/2016, 4:45 PM

## 2016-02-11 NOTE — Anesthesia Postprocedure Evaluation (Signed)
Anesthesia Post Note  Patient: Jacob Ford  Procedure(s) Performed: Procedure(s) (LRB): Endovascular Repair/Stent Graft (N/A)  Patient location during evaluation: ICU Anesthesia Type: General Level of consciousness: awake, awake and alert and oriented Pain management: pain level controlled Vital Signs Assessment: post-procedure vital signs reviewed and stable Respiratory status: spontaneous breathing, nonlabored ventilation and respiratory function stable Cardiovascular status: stable Postop Assessment: no signs of nausea or vomiting Anesthetic complications: no     Last Vitals:  Vitals:   02/11/16 0500 02/11/16 0600  BP: (!) 156/89 (!) 142/84  Pulse: 69 69  Resp: 17 20  Temp:      Last Pain:  Vitals:   02/10/16 2000  TempSrc: Oral  PainSc:                  Lyn RecordsNoles,  [name]

## 2016-02-26 ENCOUNTER — Ambulatory Visit (INDEPENDENT_AMBULATORY_CARE_PROVIDER_SITE_OTHER): Payer: Medicare Other | Admitting: Vascular Surgery

## 2016-02-26 VITALS — BP 113/67 | HR 83 | Resp 16 | Ht 74.0 in | Wt 208.0 lb

## 2016-02-26 DIAGNOSIS — F17200 Nicotine dependence, unspecified, uncomplicated: Secondary | ICD-10-CM

## 2016-02-26 DIAGNOSIS — I714 Abdominal aortic aneurysm, without rupture, unspecified: Secondary | ICD-10-CM

## 2016-02-26 DIAGNOSIS — E785 Hyperlipidemia, unspecified: Secondary | ICD-10-CM

## 2016-02-26 DIAGNOSIS — I2510 Atherosclerotic heart disease of native coronary artery without angina pectoris: Secondary | ICD-10-CM

## 2016-02-26 DIAGNOSIS — I10 Essential (primary) hypertension: Secondary | ICD-10-CM

## 2016-02-26 DIAGNOSIS — J449 Chronic obstructive pulmonary disease, unspecified: Secondary | ICD-10-CM

## 2016-02-26 NOTE — Progress Notes (Signed)
Patient ID: Jacob Ford, male   DOB: Sep 11, 1940, 76 y.o.   MRN: [id number]  Chief Complaint  Patient presents with  . Follow-up    HPI Jacob Ford is a 76 y.o. male.  Patient returns 2 weeks following endovascular abdominal aortic aneurysm repair with an iliac branch device and a typical Gore excluder abdominal aortic endoprosthesis. He has done well and is has not had any pain. He reports his incisions are healed reasonably well. He denies any new problems.   Past Medical History:  Diagnosis Date  . Asthma   . CAD (coronary artery disease)    a. 2002 Cath: Nonobs dzs per patient;  b. 11/2014 NSTEMI/PCI: LM nl, LAD 30p, 60p, 80d, D1/2/3 min irregs, RI min irregs, LCX, 6036m, OM1 min irregs, OM2 nl, OM3 50ost, RCA 40p, 100d (2.25x18 Xience Alpine DES), EF 55-65%; c. 01/2015 MV: nl EF, no ischemia.  [name]. COPD (chronic obstructive pulmonary disease) (HCC)   . Cough    SMOKER. DENIES FEVER/WHEEZING/ COUGH NONPRODUCTIVE  . Dyspnea    chronic  . Hyperlipidemia   . Hypertension   . Hypertensive heart disease   . Myocardial infarction    2016  . Peripheral vascular disease (HCC)   . Tobacco abuse   . Tobacco abuse     Past Surgical History:  Procedure Laterality Date  . CARDIAC CATHETERIZATION    . CARDIAC CATHETERIZATION Bilateral 12/08/2014   Procedure: Left Heart Cath and Coronary Angiography;  Surgeon: [name], MD;  Location: [facility] INVASIVE CV [facility];  Service: Cardiovascular;  Laterality: Bilateral;  . CARDIAC CATHETERIZATION N/A 12/08/2014   Procedure: Coronary Stent Intervention;  Surgeon: [name], MD;  Location: [facility] CV [facility];  Service: Cardiovascular;  Laterality: N/A;  . CORONARY ANGIOPLASTY     stent 2016  . PERIPHERAL VASCULAR CATHETERIZATION Right 01/18/2016   Procedure: Embolization;  Surgeon: [name], MD;  Location: [facility] INV[facility] CV [facility];  Service: Cardiovascular;  Laterality: Right;  . PERIPHERAL VASCULAR CATHETERIZATION N/A 02/10/2016   Procedure: Endovascular Repair/Stent Graft;  Surgeon: Annice NeedyJason S [name], MD;  Location: [facility] INV[facility] CV [facility];  Service: Cardiovascular;  Laterality: N/A;  . SHOULDER SURGERY Left       No Known Allergies  Current Outpatient Prescriptions  Medication Sig Dispense Refill  . aspirin EC 81 MG EC tablet Take 1 tablet (81 mg total) by mouth daily. 30 tablet 2  . atorvastatin (LIPITOR) 80 MG tablet Take 1 tablet (80 mg total) by mouth daily at 6 PM. 30 tablet 2  . clopidogrel (PLAVIX) 75 MG tablet Take 1 tablet (75 mg total) by mouth daily. 30 tablet 5  . fluticasone furoate-vilanterol (BREO ELLIPTA) 100-25 MCG/INH AEPB Inhale 1 puff into the lungs daily.    [name]. lisinopril-hydrochlorothiazide (PRINZIDE,ZESTORETIC) 20-25 MG tablet Take 1 tablet by mouth daily.    . metoprolol (LOPRESSOR) 25 MG tablet Take 1 tablet (25 mg total) by mouth 2 (two) times daily. 30 tablet 0  . montelukast (SINGULAIR) 10 MG tablet Take 10 mg by mouth at bedtime.    . nitroGLYCERIN (NITROSTAT) 0.4 MG SL tablet Place 1 tablet (0.4 mg total) under the tongue every 5 (five) minutes as needed for chest pain. 25 tablet 3  . oxyCODONE-acetaminophen (PERCOCET/ROXICET) 5-325 MG tablet Take 1-2 tablets by mouth every 4 (four) hours as needed for moderate pain. 30 tablet 0   No current facility-administered medications for this visit.         Physical Exam BP  113/67 (BP Location: Right Arm)   Pulse 83   Resp 16   Ht 6\' 2"  (1.88 m)   Wt 208 lb (94.3 kg)   BMI 26.71 kg/m  Gen:  WD/WN, NAD Skin: incision C/D/I     Assessment/Plan:  Essential hypertension, benign blood pressure control important in reducing the progression of atherosclerotic disease. On appropriate oral medications.   AAA (abdominal aortic aneurysm) without rupture [facility]) Doing well status post endovascular repair of his aneurysm as well as coil embolization of the right hypogastric artery and a branch endoprosthesis to the left hypogastric artery. Resume  all normal activities without limitations. Return to clinic in about 3 months with duplex for follow-up.      [name] 02/26/2016, 9:24 AM   This note was created with Dragon medical transcription system.  Any errors from dictation are unintentional.

## 2016-02-26 NOTE — Assessment & Plan Note (Signed)
blood pressure control important in reducing the progression of atherosclerotic disease. On appropriate oral medications.  

## 2016-02-26 NOTE — Assessment & Plan Note (Signed)
Doing well status post endovascular repair of his aneurysm as well as coil embolization of the right hypogastric artery and a branch endoprosthesis to the left hypogastric artery. Resume all normal activities without limitations. Return to clinic in about 3 months with duplex for follow-up.

## 2016-03-01 ENCOUNTER — Encounter: Payer: Self-pay | Admitting: Vascular Surgery

## 2016-06-03 ENCOUNTER — Ambulatory Visit (INDEPENDENT_AMBULATORY_CARE_PROVIDER_SITE_OTHER): Payer: Medicare Other | Admitting: Vascular Surgery

## 2016-06-03 ENCOUNTER — Encounter (INDEPENDENT_AMBULATORY_CARE_PROVIDER_SITE_OTHER): Payer: Self-pay | Admitting: Vascular Surgery

## 2016-06-03 ENCOUNTER — Ambulatory Visit (INDEPENDENT_AMBULATORY_CARE_PROVIDER_SITE_OTHER): Payer: Medicare Other

## 2016-06-03 VITALS — BP 101/64 | HR 72 | Resp 15 | Ht 74.0 in | Wt 208.0 lb

## 2016-06-03 DIAGNOSIS — G8929 Other chronic pain: Secondary | ICD-10-CM

## 2016-06-03 DIAGNOSIS — I714 Abdominal aortic aneurysm, without rupture, unspecified: Secondary | ICD-10-CM

## 2016-06-03 DIAGNOSIS — M5441 Lumbago with sciatica, right side: Secondary | ICD-10-CM

## 2016-06-03 DIAGNOSIS — I10 Essential (primary) hypertension: Secondary | ICD-10-CM | POA: Diagnosis not present

## 2016-06-03 DIAGNOSIS — E785 Hyperlipidemia, unspecified: Secondary | ICD-10-CM | POA: Diagnosis not present

## 2016-06-03 DIAGNOSIS — M549 Dorsalgia, unspecified: Secondary | ICD-10-CM | POA: Insufficient documentation

## 2016-06-03 NOTE — Assessment & Plan Note (Signed)
blood pressure control important in reducing the progression of atherosclerotic disease and aneurysmal degeneration. On appropriate oral medications.  

## 2016-06-03 NOTE — Progress Notes (Signed)
MRN : [id number]  Jacob Ford is a 76 y.o. (1941/01/07) male who presents with chief complaint of  Chief Complaint  Patient presents with  . AAA    3 month u/s follow up  .  History of Present Illness: Patient returns today in follow up of AAA and iliac artery aneurysms. He still complains of significant back pain that radiates down his legs particularly the right side. He says this has become almost disabling. His aortic duplex today shows a patent stent graft without evidence of an endoleak. He has had a significant decrease in the size of both his aortic aneurysm and his common iliac artery aneurysms. His maximal aortic diameter and right common iliac arteries are both down almost a centimeter. His aorta now measures 5.47 m maximal diameter. His right common iliac artery measures 2.9 cm in maximal diameter. His left common iliac artery aneurysm was slightly decreased in size to 3.3 cm in maximal diameter. He still has significant common femoral artery aneurysms bilaterally which are unchanged.  Current Outpatient Prescriptions  Medication Sig Dispense Refill  . aspirin EC 81 MG EC tablet Take 1 tablet (81 mg total) by mouth daily. 30 tablet 2  . atorvastatin (LIPITOR) 80 MG tablet Take 1 tablet (80 mg total) by mouth daily at 6 PM. 30 tablet 2  . clopidogrel (PLAVIX) 75 MG tablet Take 1 tablet (75 mg total) by mouth daily. 30 tablet 5  . fluticasone furoate-vilanterol (BREO ELLIPTA) 100-25 MCG/INH AEPB Inhale 1 puff into the lungs daily.    [name] lisinopril-hydrochlorothiazide (PRINZIDE,ZESTORETIC) 20-25 MG tablet Take 1 tablet by mouth daily.    . metoprolol tartrate (LOPRESSOR) 50 MG tablet     . montelukast (SINGULAIR) 10 MG tablet Take 10 mg by mouth at bedtime.    . nitroGLYCERIN (NITROSTAT) 0.4 MG SL tablet Place 1 tablet (0.4 mg total) under the tongue every 5 (five) minutes as needed for chest pain. 25 tablet 3  . oxyCODONE-acetaminophen (PERCOCET/ROXICET) 5-325 MG tablet Take 1-2  tablets by mouth every 4 (four) hours as needed for moderate pain. 30 tablet 0   No current facility-administered medications for this visit.     Past Medical History:  Diagnosis Date  . Asthma   . CAD (coronary artery disease)    a. 2002 Cath: Nonobs dzs per patient;  b. 11/2014 NSTEMI/PCI: LM nl, LAD 30p, 60p, 80d, D1/2/3 min irregs, RI min irregs, LCX, 85m, OM1 min irregs, OM2 nl, OM3 50ost, RCA 40p, 100d (2.25x18 Xience Alpine DES), EF 55-65%; c. 01/2015 MV: nl EF, no ischemia.  [name] COPD (chronic obstructive pulmonary disease) (HCC)   . Cough    SMOKER. DENIES FEVER/WHEEZING/ COUGH NONPRODUCTIVE  . Dyspnea    chronic  . Hyperlipidemia   . Hypertension   . Hypertensive heart disease   . Myocardial infarction (HCC)    2016  . Peripheral vascular disease (HCC)   . Tobacco abuse   . Tobacco abuse     Past Surgical History:  Procedure Laterality Date  . CARDIAC CATHETERIZATION    . CARDIAC CATHETERIZATION Bilateral 12/08/2014   Procedure: Left Heart Cath and Coronary Angiography;  Surgeon: Iran Ouch, MD;  Location: [facility];  Service: Cardiovascular;  Laterality: Bilateral;  . CARDIAC CATHETERIZATION N/A 12/08/2014   Procedure: Coronary Stent Intervention;  Surgeon: Iran Ouch, MD;  Location: [facility];  Service: Cardiovascular;  Laterality: N/A;  . CORONARY ANGIOPLASTY     stent 2016  .  ENDOVASCULAR REPAIR/STENT GRAFT N/A 02/10/2016   Procedure: Endovascular Repair/Stent Graft;  Surgeon: Annice NeedyJason S Simmone Cape, MD;  Location: [facility];  Service: Cardiovascular;  Laterality: N/A;  . PERIPHERAL VASCULAR CATHETERIZATION Right 01/18/2016   Procedure: Embolization;  Surgeon: Annice NeedyJason S Kassem Kibbe, MD;  Location: [facility];  Service: Cardiovascular;  Laterality: Right;  . SHOULDER SURGERY Left     Social History Social History  Substance Use Topics  . Smoking status: Current Every Day Smoker    Packs/day: 1.00    Types: Cigarettes  . Smokeless  tobacco: Never Used  . Alcohol use No    Family History Family History  Problem Relation Age of Onset  . Hypertension Mother   . Hypertension Father   . CAD Brother   . CAD Brother   . Stroke Sister   . Stroke Sister      No Known Allergies   REVIEW OF SYSTEMS (Negative unless checked)  Constitutional: [] Weight loss  [] Fever  [] Chills Cardiac: [] Chest pain   [] Chest pressure   [] Palpitations   [] Shortness of breath when laying flat   [] Shortness of breath at rest   [] Shortness of breath with exertion. Vascular:  [] Pain in legs with walking   [] Pain in legs at rest   [] Pain in legs when laying flat   [] Claudication   [] Pain in feet when walking  [] Pain in feet at rest  [] Pain in feet when laying flat   [] History of DVT   [] Phlebitis   [x] Swelling in legs   [] Varicose veins   [] Non-healing ulcers Pulmonary:   [] Uses home oxygen   [] Productive cough   [] Hemoptysis   [] Wheeze  [] COPD   [] Asthma Neurologic:  [] Dizziness  [] Blackouts   [] Seizures   [] History of stroke   [] History of TIA  [] Aphasia   [] Temporary blindness   [] Dysphagia   [] Weakness or numbness in arms   [] Weakness or numbness in legs Musculoskeletal:  [x] Arthritis   [] Joint swelling   [] Joint pain   [x] Low back pain Hematologic:  [] Easy bruising  [] Easy bleeding   [] Hypercoagulable state   [] Anemic   Gastrointestinal:  [] Blood in stool   [] Vomiting blood  [] Gastroesophageal reflux/heartburn   [] Abdominal pain Genitourinary:  [] Chronic kidney disease   [] Difficult urination  [] Frequent urination  [] Burning with urination   [] Hematuria Skin:  [] Rashes   [] Ulcers   [] Wounds Psychological:  [] History of anxiety   []  History of major depression.  Physical Examination  BP 101/64 (BP Location: Right Arm)   Pulse 72   Resp 15   Ht 6\' 2"  (1.88 m)   Wt 208 lb (94.3 kg)   BMI 26.71 kg/m  Gen:  WD/WN, NAD Head: [place]/AT, No temporalis wasting. Ear/Nose/Throat: Hearing grossly intact, nares w/o erythema or drainage, trachea  midline Eyes: Conjunctiva clear. Sclera non-icteric Neck: Supple.  No JVD.  Pulmonary:  Good air movement, no use of accessory muscles.  Cardiac: RRR, normal S1, S2 Vascular:  Vessel Right Left  Radial Palpable Palpable  Ulnar Palpable Palpable  Brachial Palpable Palpable  Carotid Palpable, without bruit Palpable, without bruit  Aorta Not palpable N/A  Femoral Palpable Palpable  Popliteal Palpable Palpable  PT Palpable Palpable  DP Palpable Palpable   Gastrointestinal: soft, non-tender/non-distended. No guarding/reflex.  Musculoskeletal: M/S 5/5 throughout.  No deformity or atrophy. Mild lower extremity edema. Neurologic: Sensation grossly intact in extremities.  Symmetrical.  Speech is fluent.  Psychiatric: Judgment intact, Mood & affect appropriate for pt's clinical situation. Dermatologic: No rashes or  ulcers noted.  No cellulitis or open wounds.       Labs No results found for this or any previous visit (from the past 2160 hour(s)).  Radiology No results found.    Assessment/Plan  Essential hypertension, benign blood pressure control important in reducing the progression of atherosclerotic disease and aneurysmal degeneration. On appropriate oral medications.   Hyperlipidemia lipid control important in reducing the progression of atherosclerotic disease. Continue statin therapy   Back pain Given the marked improvement in his aneurysm, I have to think his continued back pain is more a spine or musculoskeletal issue. We will make a referral to a spine specialist.  AAA (abdominal aortic aneurysm) without rupture (HCC) His aortic duplex today shows a patent stent graft without evidence of an endoleak. He has had a significant decrease in the size of both his aortic aneurysm and his common iliac artery aneurysms. His maximal aortic diameter and right common iliac arteries are both down almost a centimeter. His aorta now measures 5.47 m maximal diameter. His right  common iliac artery measures 2.9 cm in maximal diameter. His left common iliac artery aneurysm was slightly decreased in size to 3.3 cm in maximal diameter. He still has significant common femoral artery aneurysms bilaterally which are unchanged. At this point, we will continue to follow him and I could stretch out his follow-up interval 6 months. Smoking cessation and blood pressure control would be helpful.    [name], MD  06/03/2016 11:01 AM    This note was created with Dragon medical transcription system.  Any errors from dictation are purely unintentional

## 2016-06-03 NOTE — Assessment & Plan Note (Signed)
His aortic duplex today shows a patent stent graft without evidence of an endoleak. He has had a significant decrease in the size of both his aortic aneurysm and his common iliac artery aneurysms. His maximal aortic diameter and right common iliac arteries are both down almost a centimeter. His aorta now measures 5.47 m maximal diameter. His right common iliac artery measures 2.9 cm in maximal diameter. His left common iliac artery aneurysm was slightly decreased in size to 3.3 cm in maximal diameter. He still has significant common femoral artery aneurysms bilaterally which are unchanged. At this point, we will continue to follow him and I could stretch out his follow-up interval 6 months. Smoking cessation and blood pressure control would be helpful.

## 2016-06-03 NOTE — Assessment & Plan Note (Signed)
Given the marked improvement in his aneurysm, I have to think his continued back pain is more a spine or musculoskeletal issue. We will make a referral to a spine specialist.

## 2016-06-03 NOTE — Assessment & Plan Note (Signed)
lipid control important in reducing the progression of atherosclerotic disease. Continue statin therapy  

## 2016-06-13 ENCOUNTER — Encounter: Payer: Self-pay | Admitting: Cardiovascular Disease

## 2016-06-13 ENCOUNTER — Ambulatory Visit (INDEPENDENT_AMBULATORY_CARE_PROVIDER_SITE_OTHER): Payer: Medicare Other | Admitting: Cardiovascular Disease

## 2016-06-13 VITALS — BP 122/66 | HR 77 | Ht 74.0 in | Wt 208.2 lb

## 2016-06-13 DIAGNOSIS — I10 Essential (primary) hypertension: Secondary | ICD-10-CM

## 2016-06-13 DIAGNOSIS — Z720 Tobacco use: Secondary | ICD-10-CM

## 2016-06-13 DIAGNOSIS — I739 Peripheral vascular disease, unspecified: Secondary | ICD-10-CM | POA: Diagnosis not present

## 2016-06-13 DIAGNOSIS — I2510 Atherosclerotic heart disease of native coronary artery without angina pectoris: Secondary | ICD-10-CM | POA: Diagnosis not present

## 2016-06-13 MED ORDER — VARENICLINE TARTRATE 0.5 MG X 11 & 1 MG X 42 PO MISC: ORAL | 0 refills | Status: DC

## 2016-06-13 MED ORDER — VARENICLINE TARTRATE 1 MG PO TABS: 1.0000 mg | ORAL_TABLET | Freq: Two times a day (BID) | ORAL | 1 refills | Status: DC

## 2016-06-13 NOTE — Patient Instructions (Signed)
Medication Instructions:  Your physician has recommended you make the following change in your medication:  START taking chantix. Fill the written prescription now and follow directions on the package. Continuation packs have been sent to your pharmacy with a start date of July 4.   Labwork: None   Testing/Procedures: none  Follow-Up: Your physician wants you to follow-up in: 6 months with Dr. Kirke CorinArida.  You will receive a reminder letter in the mail two months in advance. If you don't receive a letter, please call our office to schedule the follow-up appointment.   Any Other Special Instructions Will Be Listed Below (If Applicable).     If you need a refill on your cardiac medications before your next appointment, please call your pharmacy.

## 2016-06-13 NOTE — Progress Notes (Signed)
Cardiology Office Note   Date:  06/13/2016   ID:  Jacob Ford Jacob Ford, DOB 1940/02/21, MRN [id number]  PCP:  Derwood KaplanEason, Ernest B, MD  Cardiologist:   Jacob BearsMuhammad Arida, MD   Chief Complaint  Patient presents with  . other    6 month follow up. Patient c/o lower back pain and SOB. Meds reviewed verbally with patient.       History of Present Illness: Jacob Ford is a 76 y.o. male who presents for a follow-up visit regarding coronary artery disease. He had non-ST elevation myocardial infarction November 2016. Cardiac catheterization showed occluded distal RCA with significant distal LAD disease. He underwent angioplasty and drug-eluting stent placement to the distal right coronary artery. Distal LAD disease was treated medically. Nuclear stress test in January 2017 showed no evidence of ischemia. He has other chronic medical conditions that include hypertension, hyperlipidemia, tobacco use and COPD.  During last visit, he complained of claudication. 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 He was referred to Dr. dew who performed endovascular repair without complications. The patient has been doing reasonably well. He complains of lower back pain. No chest pain. He does complain of shortness of breath but he continues to smoke. He has been taking his medications regularly.    Past Medical History:  Diagnosis Date  . Asthma   . CAD (coronary artery disease)    a. 2002 Cath: Nonobs dzs per patient;  b. 11/2014 NSTEMI/PCI: LM nl, LAD 30p, 60p, 80d, D1/2/3 min irregs, RI min irregs, LCX, 2280m, OM1 min irregs, OM2 nl, OM3 50ost, RCA 40p, 100d (2.25x18 Xience Alpine DES), EF 55-65%; c. 01/2015 MV: nl EF, no ischemia.  [name]. COPD (chronic obstructive pulmonary disease) (HCC)   . Cough    SMOKER. DENIES FEVER/WHEEZING/ COUGH NONPRODUCTIVE  . Dyspnea    chronic  . Hyperlipidemia   . Hypertension   . Hypertensive heart disease   . Myocardial infarction (HCC)     2016  . Peripheral vascular disease (HCC)   . Tobacco abuse   . Tobacco abuse     Past Surgical History:  Procedure Laterality Date  . CARDIAC CATHETERIZATION    . CARDIAC CATHETERIZATION Bilateral 12/08/2014   Procedure: Left Heart Cath and Coronary Angiography;  Surgeon: [name], MD;  Location: [facility];  Service: Cardiovascular;  Laterality: Bilateral;  . CARDIAC CATHETERIZATION N/A 12/08/2014   Procedure: Coronary Stent Intervention;  Surgeon: [name], MD;  Location: [facility];  Service: Cardiovascular;  Laterality: N/A;  . CORONARY ANGIOPLASTY     stent 2016  . ENDOVASCULAR REPAIR/STENT GRAFT N/A 02/10/2016   Procedure: Endovascular Repair/Stent Graft;  Surgeon: Annice NeedyJason S Dew, MD;  Location: [facility];  Service: Cardiovascular;  Laterality: N/A;  . PERIPHERAL VASCULAR CATHETERIZATION Right 01/18/2016   Procedure: Embolization;  Surgeon: Annice NeedyJason S Dew, MD;  Location: [facility];  Service: Cardiovascular;  Laterality: Right;  . SHOULDER SURGERY Left      Current Outpatient Prescriptions  Medication Sig Dispense Refill  . aspirin EC 81 MG EC tablet Take 1 tablet (81 mg total) by mouth daily. 30 tablet 2  . atorvastatin (LIPITOR) 80 MG tablet Take 1 tablet (80 mg total) by mouth daily at 6 PM. 30 tablet 2  . clopidogrel (PLAVIX) 75 MG tablet Take 1 tablet (75 mg total) by mouth daily. 30 tablet 5  . fluticasone furoate-vilanterol (BREO ELLIPTA) 100-25 MCG/INH AEPB Inhale 1  puff into the lungs daily.    [name] lisinopril-hydrochlorothiazide (PRINZIDE,ZESTORETIC) 20-25 MG tablet Take 1 tablet by mouth daily.    . metoprolol tartrate (LOPRESSOR) 50 MG tablet     . montelukast (SINGULAIR) 10 MG tablet Take 10 mg by mouth at bedtime.    . nitroGLYCERIN (NITROSTAT) 0.4 MG SL tablet Place 1 tablet (0.4 mg total) under the tongue every 5 (five) minutes as needed for chest pain. 25 tablet 3  . [START ON 07/13/2016] varenicline (CHANTIX  CONTINUING MONTH PAK) 1 MG tablet Take 1 tablet (1 mg total) by mouth 2 (two) times daily. 60 tablet 1  . varenicline (CHANTIX STARTING MONTH PAK) 0.5 MG X 11 & 1 MG X 42 tablet Take one 0.5 mg tab once daily for 3 days, then increase to one 0.5 mg tab twice daily for 4 days, then increase to one 1 mg tab twice daily. 53 tablet 0   No current facility-administered medications for this visit.     Allergies:   Patient has no known allergies.    Social History:  The patient  reports that he has been smoking Cigarettes.  He has been smoking about 1.00 pack per day. He has never used smokeless tobacco. He reports that he does not drink alcohol or use drugs.   Family History:  The patient's family history includes CAD in his brother and brother; Hypertension in his father and mother; Stroke in his sister and sister.    ROS:  Please see the history of present illness.   Otherwise, review of systems are positive for none.   All other systems are reviewed and negative.    PHYSICAL EXAM: VS:  BP 122/66 (BP Location: Left Arm, Patient Position: Sitting, Cuff Size: Normal)   Pulse 77   Ht 6\' 2"  (1.88 m)   Wt 208 lb 4 oz (94.5 kg)   BMI 26.74 kg/m  , BMI Body mass index is 26.74 kg/m. GEN: Well nourished, well developed, in no acute distress  HEENT: normal  Neck: no JVD, carotid bruits, or masses Cardiac: RRR; no murmurs, rubs, or gallops,no edema  Respiratory:  clear to auscultation bilaterally, normal work of breathing GI: soft, nontender, nondistended, + BS MS: no deformity or atrophy  Skin: warm and dry, no rash Neuro:  Strength and sensation are intact Psych: euthymic mood, full affect   EKG:  EKG is ordered today. The ekg ordered today demonstrates  sinus rhythm with PVC.   Recent Labs: 01/20/2016: ALT 8 02/11/2016: BUN 9; Creatinine, Ser 0.86; Hemoglobin 10.7; Platelets 189; Potassium 3.8; Sodium 138    Lipid Panel    Component Value Date/Time   CHOL 122 01/15/2015 1110    CHOL 161 12/14/2012 0346   TRIG 68 01/15/2015 1110   TRIG 140 12/14/2012 0346   HDL 36 (L) 01/15/2015 1110   HDL 47 12/14/2012 0346   CHOLHDL 3.4 01/15/2015 1110   CHOLHDL 4.6 12/08/2014 0337   VLDL 10 12/08/2014 0337   VLDL 28 12/14/2012 0346   LDLCALC 72 01/15/2015 1110   LDLCALC 86 12/14/2012 0346      Wt Readings from Last 3 Encounters:  06/13/16 208 lb 4 oz (94.5 kg)  06/03/16 208 lb (94.3 kg)  02/26/16 208 lb (94.3 kg)      No flowsheet data found.    ASSESSMENT AND PLAN:  1.  Coronary artery disease involving native coronary arteries without angina: He is doing reasonably well from a cardiac standpoint.  No anginal symptoms. Continue medical  therapy   2. Essential hypertension: Blood pressure is controlled on current medications.  3. Hyperlipidemia: Continue high dose atorvastatin. Most recent LDL was 72.  4. Tobacco use:I had a prolonged discussion with him about the importance of smoking cessation and discussed with him different management options for this. After discussion, we elected to start Chantix.   5.  Abdominal aortic and iliac aneurysms: Followed by Dr. dew.   Disposition:   FU with me in 6 months  Signed,  Jacob Bears, MD  06/13/2016 4:07 PM    [facility]

## 2016-06-20 ENCOUNTER — Ambulatory Visit (INDEPENDENT_AMBULATORY_CARE_PROVIDER_SITE_OTHER): Payer: Medicare Other | Admitting: Orthopaedic Surgery

## 2016-06-20 ENCOUNTER — Encounter (INDEPENDENT_AMBULATORY_CARE_PROVIDER_SITE_OTHER): Payer: Self-pay | Admitting: Orthopaedic Surgery

## 2016-06-20 ENCOUNTER — Ambulatory Visit (INDEPENDENT_AMBULATORY_CARE_PROVIDER_SITE_OTHER): Payer: Medicare Other

## 2016-06-20 VITALS — BP 128/69 | HR 69 | Ht 74.0 in | Wt 205.0 lb

## 2016-06-20 DIAGNOSIS — M5440 Lumbago with sciatica, unspecified side: Secondary | ICD-10-CM | POA: Diagnosis not present

## 2016-06-20 NOTE — Progress Notes (Signed)
Office Visit Note   Patient: Jacob Ford           Date of Birth: [birth year]           MRN: [id number] Visit Date: 06/20/2016              Requested by: Annice Needy, MD [address] [place], [place] [address] PCP: Derwood Kaplan, MD   Assessment & Plan: Visit Diagnoses:  1. Acute bilateral low back pain with sciatica, sciatica laterality unspecified   Films demonstrate degenerative changes at L4-5. Clinically he might have spinal stenosis  Plan: MRI scan lumbar spine to further delineate pathology and lumbar spine and to determine if right lower extremity claudication and/or radiculopathy is related to the back  Follow-Up Instructions: No Follow-up on file.   Orders:  Orders Placed This Encounter  Procedures  . XR Lumbar Spine 2-3 Views  . XR Pelvis 1-2 Views   No orders of the defined types were placed in this encounter.     Procedures: No procedures performed   Clinical Data: No additional findings.   Subjective: Chief Complaint  Patient presents with  . Lower Back - Pain  [name] is 76 years old and is here for evaluation of low back pain associated with right lower extremity radiculopathy. His past history is quite significant that he's had a long-term smoker. He is had a prior coronary artery stent and bilateral iliac artery endovascular stents. He was recently evaluated by the vascular surgeons who felt that the pain in his back and his right lower extremity was not necessarily related to the stents. He notes that he has pain in the lower back when he stands for any more than 3 or 4 minutes and with longer periods of standing or walking a left some discomfort in the entire right lower extremity is far distally as his ankle. Not experiencing any left lower extremity problems  HPI  Review of Systems   Objective: Vital Signs: BP 128/69   Pulse 69   Ht 6\' 2"  (1.88 m)   Wt 205 lb (93 kg)   BMI 26.32 kg/m   Physical Exam  Ortho Exam straight leg  raise negative bilaterally. Painless range of motion of both hips and both knees. No percussible tenderness of either buttock but does have some mild tenderness at the lumbosacral junction. Neuro exam intact. Minimally palpated pulses.  Specialty Comments:  No specialty comments available.  Imaging: Xr Lumbar Spine 2-3 Views  Result Date: 06/20/2016 AP lateral lumbar spine demonstrates abundant bowel gas. There is some decreased joint space at L4-5 with anterior osteophytes and subchondral sclerosis. No evidence of listhesis.  Xr Pelvis 1-2 Views  Result Date: 06/20/2016 AP of the pelvis demonstrate some mild degenerative changes bilaterally. Joint spaces are well maintained. There is some mild irregularity over the greater trochanters bilaterally. Some calcification within the femoral arteries. Abundant bowel gas. Endovascular procedure with stents in both iliac arteries. Some subchondral cyst formation more left than right hip. Clinically asymptomatic    PMFS History: Patient Active Problem List   Diagnosis Date Noted  . Back pain 06/03/2016  . Essential hypertension, benign 12/25/2015  . AAA (abdominal aortic aneurysm) without rupture (HCC) 12/25/2015  . Tobacco abuse   . Hyperlipidemia   . CAD (coronary artery disease)   . Hypertensive heart disease   . COPD (chronic obstructive pulmonary disease) (HCC)   . Angina pectoris (HCC)   . Coronary artery disease involving native coronary artery  of native heart with angina pectoris with documented spasm (HCC)   . Smoker   . NSTEMI (non-ST elevated myocardial infarction) (HCC) 12/07/2014   Past Medical History:  Diagnosis Date  . Asthma   . CAD (coronary artery disease)    a. 2002 Cath: Nonobs dzs per patient;  b. 11/2014 NSTEMI/PCI: LM nl, LAD 30p, 60p, 80d, D1/2/3 min irregs, RI min irregs, LCX, 6040m, OM1 min irregs, OM2 nl, OM3 50ost, RCA 40p, 100d (2.25x18 Xience Alpine DES), EF 55-65%; c. 01/2015 MV: nl EF, no ischemia.  [name]. COPD  (chronic obstructive pulmonary disease) (HCC)   . Cough    SMOKER. DENIES FEVER/WHEEZING/ COUGH NONPRODUCTIVE  . Dyspnea    chronic  . Hyperlipidemia   . Hypertension   . Hypertensive heart disease   . Myocardial infarction (HCC)    2016  . Peripheral vascular disease (HCC)   . Tobacco abuse   . Tobacco abuse     Family History  Problem Relation Age of Onset  . Hypertension Mother   . Hypertension Father   . CAD Brother   . CAD Brother   . Stroke Sister   . Stroke Sister     Past Surgical History:  Procedure Laterality Date  . CARDIAC CATHETERIZATION    . CARDIAC CATHETERIZATION Bilateral 12/08/2014   Procedure: Left Heart Cath and Coronary Angiography;  Surgeon: [name], MD;  Location: [facility];  Service: Cardiovascular;  Laterality: Bilateral;  . CARDIAC CATHETERIZATION N/A 12/08/2014   Procedure: Coronary Stent Intervention;  Surgeon: [name], MD;  Location: [facility];  Service: Cardiovascular;  Laterality: N/A;  . CORONARY ANGIOPLASTY     stent 2016  . ENDOVASCULAR REPAIR/STENT GRAFT N/A 02/10/2016   Procedure: Endovascular Repair/Stent Graft;  Surgeon: Annice NeedyJason S Dew, MD;  Location: [facility];  Service: Cardiovascular;  Laterality: N/A;  . PERIPHERAL VASCULAR CATHETERIZATION Right 01/18/2016   Procedure: Embolization;  Surgeon: Annice NeedyJason S Dew, MD;  Location: [facility];  Service: Cardiovascular;  Laterality: Right;  . SHOULDER SURGERY Left    Social History   Occupational History  . Not on file.   Social History Main Topics  . Smoking status: Current Every Day Smoker    Packs/day: 1.00    Types: Cigarettes  . Smokeless tobacco: Never Used  . Alcohol use No  . Drug use: No  . Sexual activity: No     Valeria BatmanPeter W Aristidis Talerico, MD   Note - This record has been created using [employer] software.  Chart creation errors have been sought, but may not always  have been located. Such creation errors do not reflect on    the standard of medical care.

## 2016-06-23 ENCOUNTER — Ambulatory Visit: Payer: Medicare Other

## 2016-06-23 ENCOUNTER — Other Ambulatory Visit (INDEPENDENT_AMBULATORY_CARE_PROVIDER_SITE_OTHER): Payer: Self-pay

## 2016-06-23 DIAGNOSIS — M5440 Lumbago with sciatica, unspecified side: Secondary | ICD-10-CM

## 2016-06-29 ENCOUNTER — Ambulatory Visit
Admission: RE | Admit: 2016-06-29 | Discharge: 2016-06-29 | Disposition: A | Discharge status: Home / Self Care | Payer: Medicare Other | Source: Ambulatory Visit | Attending: Orthopaedic Surgery | Admitting: Orthopaedic Surgery

## 2016-06-29 DIAGNOSIS — M48061 Spinal stenosis, lumbar region without neurogenic claudication: Secondary | ICD-10-CM | POA: Insufficient documentation

## 2016-06-29 DIAGNOSIS — M5440 Lumbago with sciatica, unspecified side: Secondary | ICD-10-CM | POA: Diagnosis present

## 2016-06-29 DIAGNOSIS — M5136 Other intervertebral disc degeneration, lumbar region: Secondary | ICD-10-CM | POA: Insufficient documentation

## 2016-06-29 DIAGNOSIS — I700 Atherosclerosis of aorta: Secondary | ICD-10-CM | POA: Insufficient documentation

## 2016-08-08 ENCOUNTER — Ambulatory Visit (INDEPENDENT_AMBULATORY_CARE_PROVIDER_SITE_OTHER): Payer: Medicare Other | Admitting: Orthopaedic Surgery

## 2016-08-08 ENCOUNTER — Encounter (INDEPENDENT_AMBULATORY_CARE_PROVIDER_SITE_OTHER): Payer: Self-pay | Admitting: Orthopaedic Surgery

## 2016-08-08 VITALS — BP 116/65 | HR 80 | Resp 18 | Ht 74.0 in | Wt 210.0 lb

## 2016-08-08 DIAGNOSIS — M5440 Lumbago with sciatica, unspecified side: Secondary | ICD-10-CM

## 2016-08-08 NOTE — Progress Notes (Signed)
Office Visit Note   Patient: Jacob Ford           Date of Birth: 21-Feb-1940           MRN: [id number] Visit Date: 08/08/2016              Requested by: Derwood KaplanEason, Ernest B, MD [address] [place], [place] [address] PCP: Derwood KaplanEason, Ernest B, MD   Assessment & Plan: Visit Diagnoses:  1. Low back pain with sciatica, sciatica laterality unspecified, unspecified back pain laterality, unspecified chronicity   CT scan demonstrates L4-5 lumbar disc arthritis with moderate multifactorial spinal stenosis right greater than left also mild spinal stenosis at L3-4.  Plan: Epidural steroid injection, office 1 month. I believe the majority of his pain is related to his back. He is alert he had stent insertion for his aortic aneurysm. We'll plan to see him back in 1 month and evaluate his response.  Follow-Up Instructions: Return in about 1 month (around 09/08/2016).   Orders:  Orders Placed This Encounter  Procedures  . Ambulatory referral to Interventional Radiology   No orders of the defined types were placed in this encounter.     Procedures: No procedures performed   Clinical Data: No additional findings.   Subjective: Chief Complaint  Patient presents with  . Lower Back - Pain  . Results    has had CT scan   Mr. Greenblatt is a CT scan of his lumbar spine demonstrating degenerative changes at L4-5 associated with spinal stenosis. He also has a aortic aneurysm status post interval stent graft placement. He is being followed by [place] physicians in [place]. He has been experiencing back pain associated with radiculopathy and claudication particularly in his left lower extremity.  HPI  Review of Systems   Objective: Vital Signs: BP 116/65   Pulse 80   Resp 18   Ht 6\' 2"  (1.88 m)   Wt 210 lb (95.3 kg)   BMI 26.96 kg/m   Physical Exam  Ortho Exam straight leg raise negative. +1 pulses. No swelling distally. Painless range of motion both hips.  Specialty Comments:  No  specialty comments available.  Imaging: No results found.   PMFS History: Patient Active Problem List   Diagnosis Date Noted  . Back pain 06/03/2016  . Essential hypertension, benign 12/25/2015  . AAA (abdominal aortic aneurysm) without rupture (HCC) 12/25/2015  . Tobacco abuse   . Hyperlipidemia   . CAD (coronary artery disease)   . Hypertensive heart disease   . COPD (chronic obstructive pulmonary disease) (HCC)   . Angina pectoris (HCC)   . Coronary artery disease involving native coronary artery of native heart with angina pectoris with documented spasm (HCC)   . Smoker   . NSTEMI (non-ST elevated myocardial infarction) (HCC) 12/07/2014   Past Medical History:  Diagnosis Date  . Asthma   . CAD (coronary artery disease)    a. 2002 Cath: Nonobs dzs per patient;  b. 11/2014 NSTEMI/PCI: LM nl, LAD 30p, 60p, 80d, D1/2/3 min irregs, RI min irregs, LCX, 4262m, OM1 min irregs, OM2 nl, OM3 50ost, RCA 40p, 100d (2.25x18 Xience Alpine DES), EF 55-65%; c. 01/2015 MV: nl EF, no ischemia.  [name]. COPD (chronic obstructive pulmonary disease) (HCC)   . Cough    SMOKER. DENIES FEVER/WHEEZING/ COUGH NONPRODUCTIVE  . Dyspnea    chronic  . Hyperlipidemia   . Hypertension   . Hypertensive heart disease   . Myocardial infarction (HCC)    2016  .  Peripheral vascular disease (HCC)   . Tobacco abuse   . Tobacco abuse     Family History  Problem Relation Age of Onset  . Hypertension Mother   . Hypertension Father   . CAD Brother   . CAD Brother   . Stroke Sister   . Stroke Sister     Past Surgical History:  Procedure Laterality Date  . CARDIAC CATHETERIZATION    . CARDIAC CATHETERIZATION Bilateral 12/08/2014   Procedure: Left Heart Cath and Coronary Angiography;  Surgeon: [name], MD;  Location: [facility];  Service: Cardiovascular;  Laterality: Bilateral;  . CARDIAC CATHETERIZATION N/A 12/08/2014   Procedure: Coronary Stent Intervention;  Surgeon: [name], MD;   Location: [facility];  Service: Cardiovascular;  Laterality: N/A;  . CORONARY ANGIOPLASTY     stent 2016  . ENDOVASCULAR REPAIR/STENT GRAFT N/A 02/10/2016   Procedure: Endovascular Repair/Stent Graft;  Surgeon: Annice NeedyJason S Dew, MD;  Location: [facility];  Service: Cardiovascular;  Laterality: N/A;  . PERIPHERAL VASCULAR CATHETERIZATION Right 01/18/2016   Procedure: Embolization;  Surgeon: Annice NeedyJason S Dew, MD;  Location: [facility];  Service: Cardiovascular;  Laterality: Right;  . SHOULDER SURGERY Left    Social History   Occupational History  . Not on file.   Social History Main Topics  . Smoking status: Current Every Day Smoker    Packs/day: 1.00    Types: Cigarettes  . Smokeless tobacco: Never Used  . Alcohol use No  . Drug use: No  . Sexual activity: No     Valeria BatmanPeter W Whitfield, MD   Note - This record has been created using [employer] software.  Chart creation errors have been sought, but may not always  have been located. Such creation errors do not reflect on  the standard of medical care.

## 2016-08-09 ENCOUNTER — Other Ambulatory Visit: Payer: Self-pay | Admitting: Cardiovascular Disease

## 2016-08-10 ENCOUNTER — Other Ambulatory Visit: Payer: Self-pay | Admitting: *Deleted

## 2016-08-10 DIAGNOSIS — M5442 Lumbago with sciatica, left side: Principal | ICD-10-CM

## 2016-08-10 DIAGNOSIS — G8929 Other chronic pain: Secondary | ICD-10-CM

## 2016-08-10 DIAGNOSIS — M5441 Lumbago with sciatica, right side: Principal | ICD-10-CM

## 2016-08-15 ENCOUNTER — Telehealth: Payer: Self-pay | Admitting: Cardiovascular Disease

## 2016-08-15 NOTE — Telephone Encounter (Signed)
Request to stop plavix 5 days before lumbar ESI received from [facility].  Placed in MD basket.

## 2016-08-18 NOTE — Telephone Encounter (Signed)
Faxed clearance to [facility], [phone]

## 2016-08-29 ENCOUNTER — Ambulatory Visit
Admission: RE | Admit: 2016-08-29 | Discharge: 2016-08-29 | Disposition: A | Discharge status: Home / Self Care | Payer: Medicare Other | Source: Ambulatory Visit | Attending: Orthopaedic Surgery | Admitting: Orthopaedic Surgery

## 2016-08-29 DIAGNOSIS — G8929 Other chronic pain: Secondary | ICD-10-CM

## 2016-08-29 DIAGNOSIS — M5442 Lumbago with sciatica, left side: Principal | ICD-10-CM

## 2016-08-29 DIAGNOSIS — M5441 Lumbago with sciatica, right side: Principal | ICD-10-CM

## 2016-08-29 MED ORDER — METHYLPREDNISOLONE ACETATE 40 MG/ML INJ SUSP (RADIOLOG
120.0000 mg | Freq: Once | INTRAMUSCULAR | Status: AC
  Administered 2016-08-29: 120 mg via EPIDURAL

## 2016-08-29 MED ORDER — IOPAMIDOL (ISOVUE-M 200) INJECTION 41%
1.0000 mL | Freq: Once | INTRAMUSCULAR | Status: AC
  Administered 2016-08-29: 1 mL via EPIDURAL

## 2016-08-29 NOTE — Discharge Instructions (Signed)

## 2016-09-13 ENCOUNTER — Telehealth (INDEPENDENT_AMBULATORY_CARE_PROVIDER_SITE_OTHER): Payer: Self-pay | Admitting: Orthopaedic Surgery

## 2016-09-13 NOTE — Telephone Encounter (Signed)
Please advise 

## 2016-09-13 NOTE — Telephone Encounter (Signed)
You may call him back and tell him that we cannot get rid of all of his pain. As long as his tolerable he can continue. If he would like another injection the future he can just call and get one scheduled.

## 2016-09-13 NOTE — Telephone Encounter (Signed)
Patient had injection in back 08/29/16. Per patient injection helped a lot, but still having a little pain with back. Patient able to walk further now. Please call to advise.

## 2016-09-14 NOTE — Telephone Encounter (Signed)
Should have ESI scheduled. If not any better have him see Dr Otelia SergeantNitka or Ophelia CharterYates

## 2016-09-15 NOTE — Telephone Encounter (Signed)
Spoke with pt and told him he can schedule with Dr. Alvester MorinNewton

## 2016-09-19 ENCOUNTER — Other Ambulatory Visit (INDEPENDENT_AMBULATORY_CARE_PROVIDER_SITE_OTHER): Payer: Self-pay

## 2016-09-19 ENCOUNTER — Other Ambulatory Visit (INDEPENDENT_AMBULATORY_CARE_PROVIDER_SITE_OTHER): Payer: Self-pay | Admitting: Orthopaedic Surgery

## 2016-09-19 ENCOUNTER — Telehealth (INDEPENDENT_AMBULATORY_CARE_PROVIDER_SITE_OTHER): Payer: Self-pay | Admitting: Orthopaedic Surgery

## 2016-09-19 DIAGNOSIS — M5440 Lumbago with sciatica, unspecified side: Secondary | ICD-10-CM

## 2016-09-19 DIAGNOSIS — M545 Low back pain: Principal | ICD-10-CM

## 2016-09-19 DIAGNOSIS — G8929 Other chronic pain: Secondary | ICD-10-CM

## 2016-09-19 NOTE — Telephone Encounter (Signed)
Thanks,he thought Dr. Alvester MorinNewton but I scheduled at GI

## 2016-09-28 ENCOUNTER — Ambulatory Visit
Admission: RE | Admit: 2016-09-28 | Discharge: 2016-09-28 | Disposition: A | Discharge status: Home / Self Care | Payer: Medicare Other | Source: Ambulatory Visit | Attending: Orthopaedic Surgery | Admitting: Orthopaedic Surgery

## 2016-09-28 DIAGNOSIS — M545 Low back pain: Principal | ICD-10-CM

## 2016-09-28 DIAGNOSIS — G8929 Other chronic pain: Secondary | ICD-10-CM

## 2016-09-28 MED ORDER — METHYLPREDNISOLONE ACETATE 40 MG/ML INJ SUSP (RADIOLOG
120.0000 mg | Freq: Once | INTRAMUSCULAR | Status: AC
  Administered 2016-09-28: 120 mg via EPIDURAL

## 2016-09-28 MED ORDER — IOPAMIDOL (ISOVUE-M 200) INJECTION 41%
1.0000 mL | Freq: Once | INTRAMUSCULAR | Status: AC
  Administered 2016-09-28: 1 mL via EPIDURAL

## 2016-12-13 ENCOUNTER — Other Ambulatory Visit (INDEPENDENT_AMBULATORY_CARE_PROVIDER_SITE_OTHER): Payer: Medicare Other

## 2016-12-13 ENCOUNTER — Ambulatory Visit (INDEPENDENT_AMBULATORY_CARE_PROVIDER_SITE_OTHER): Payer: Medicare Other | Admitting: Vascular Surgery

## 2017-01-11 ENCOUNTER — Other Ambulatory Visit (INDEPENDENT_AMBULATORY_CARE_PROVIDER_SITE_OTHER): Payer: Medicare Other

## 2017-01-11 ENCOUNTER — Ambulatory Visit (INDEPENDENT_AMBULATORY_CARE_PROVIDER_SITE_OTHER): Payer: Medicare Other | Admitting: Vascular Surgery

## 2017-03-03 ENCOUNTER — Other Ambulatory Visit: Payer: Self-pay

## 2017-03-03 ENCOUNTER — Emergency Department: Payer: Medicare Other

## 2017-03-03 ENCOUNTER — Observation Stay
Admission: EM | Admit: 2017-03-03 | Discharge: 2017-03-06 | Disposition: A | Discharge status: Home / Self Care | Payer: Medicare Other | Attending: Internal Medicine | Admitting: Internal Medicine

## 2017-03-03 ENCOUNTER — Encounter: Payer: Self-pay | Admitting: Emergency Medicine

## 2017-03-03 DIAGNOSIS — Z7982 Long term (current) use of aspirin: Secondary | ICD-10-CM | POA: Diagnosis not present

## 2017-03-03 DIAGNOSIS — I252 Old myocardial infarction: Secondary | ICD-10-CM | POA: Diagnosis not present

## 2017-03-03 DIAGNOSIS — R079 Chest pain, unspecified: Secondary | ICD-10-CM

## 2017-03-03 DIAGNOSIS — J441 Chronic obstructive pulmonary disease with (acute) exacerbation: Secondary | ICD-10-CM | POA: Insufficient documentation

## 2017-03-03 DIAGNOSIS — I119 Hypertensive heart disease without heart failure: Secondary | ICD-10-CM | POA: Diagnosis not present

## 2017-03-03 DIAGNOSIS — I25110 Atherosclerotic heart disease of native coronary artery with unstable angina pectoris: Principal | ICD-10-CM | POA: Insufficient documentation

## 2017-03-03 DIAGNOSIS — Z955 Presence of coronary angioplasty implant and graft: Secondary | ICD-10-CM | POA: Diagnosis not present

## 2017-03-03 DIAGNOSIS — Z7951 Long term (current) use of inhaled steroids: Secondary | ICD-10-CM | POA: Diagnosis not present

## 2017-03-03 DIAGNOSIS — Z8249 Family history of ischemic heart disease and other diseases of the circulatory system: Secondary | ICD-10-CM | POA: Diagnosis not present

## 2017-03-03 DIAGNOSIS — I739 Peripheral vascular disease, unspecified: Secondary | ICD-10-CM | POA: Diagnosis not present

## 2017-03-03 DIAGNOSIS — J209 Acute bronchitis, unspecified: Secondary | ICD-10-CM | POA: Diagnosis not present

## 2017-03-03 DIAGNOSIS — F17210 Nicotine dependence, cigarettes, uncomplicated: Secondary | ICD-10-CM | POA: Diagnosis not present

## 2017-03-03 DIAGNOSIS — Z7902 Long term (current) use of antithrombotics/antiplatelets: Secondary | ICD-10-CM | POA: Diagnosis not present

## 2017-03-03 DIAGNOSIS — E785 Hyperlipidemia, unspecified: Secondary | ICD-10-CM | POA: Insufficient documentation

## 2017-03-03 LAB — CBC
HCT: 37.2 % — ABNORMAL LOW (ref 40.0–52.0)
Hemoglobin: 10.5 g/dL — ABNORMAL LOW (ref 13.0–18.0)
MCH: 19.4 pg — ABNORMAL LOW (ref 26.0–34.0)
MCHC: 28.1 g/dL — AB (ref 32.0–36.0)
MCV: 68.9 fL — ABNORMAL LOW (ref 80.0–100.0)
Platelets: 261 10*3/uL (ref 150–440)
RBC: 5.4 MIL/uL (ref 4.40–5.90)
RDW: 21.8 % — AB (ref 11.5–14.5)
WBC: 8.2 10*3/uL (ref 3.8–10.6)

## 2017-03-03 LAB — BASIC METABOLIC PANEL
Anion gap: 8 (ref 5–15)
BUN: 8 mg/dL (ref 6–20)
CALCIUM: 8.9 mg/dL (ref 8.9–10.3)
CO2: 33 mmol/L — ABNORMAL HIGH (ref 22–32)
CREATININE: 0.96 mg/dL (ref 0.61–1.24)
Chloride: 92 mmol/L — ABNORMAL LOW (ref 101–111)
GFR calc Af Amer: >60 mL/min (ref >60)
Glucose, Bld: 135 mg/dL — ABNORMAL HIGH (ref 65–99)
POTASSIUM: 3.2 mmol/L — AB (ref 3.5–5.1)
SODIUM: 133 mmol/L — AB (ref 135–145)

## 2017-03-03 LAB — TROPONIN I
TROPONIN I: 0.03 ng/mL — AB (ref <0.03)
Troponin I: <0.03 ng/mL (ref <0.03)

## 2017-03-03 MED ORDER — CLOPIDOGREL BISULFATE 75 MG PO TABS
75.0000 mg | ORAL_TABLET | Freq: Every day | ORAL | Status: DC
  Administered 2017-03-03 – 2017-03-06 (×4): 75 mg via ORAL
  Filled 2017-03-03 (×3): qty 1

## 2017-03-03 MED ORDER — METOPROLOL TARTRATE 25 MG PO TABS
ORAL_TABLET | ORAL | Status: AC
  Filled 2017-03-03: qty 1

## 2017-03-03 MED ORDER — MONTELUKAST SODIUM 10 MG PO TABS
10.0000 mg | ORAL_TABLET | Freq: Every day | ORAL | Status: DC
Start: 2017-03-03 — End: 2017-03-06
  Administered 2017-03-03 – 2017-03-05 (×3): 10 mg via ORAL
  Filled 2017-03-03 (×3): qty 1

## 2017-03-03 MED ORDER — NITROGLYCERIN 0.4 MG SL SUBL
0.4000 mg | SUBLINGUAL_TABLET | SUBLINGUAL | Status: DC | PRN
  Administered 2017-03-05 – 2017-03-06 (×2): 0.4 mg via SUBLINGUAL
  Filled 2017-03-03 (×2): qty 1

## 2017-03-03 MED ORDER — ACETAMINOPHEN 325 MG PO TABS: 650.0000 mg | ORAL_TABLET | Freq: Four times a day (QID) | ORAL | Status: DC | PRN

## 2017-03-03 MED ORDER — ASPIRIN EC 81 MG PO TBEC
81.0000 mg | DELAYED_RELEASE_TABLET | Freq: Every day | ORAL | Status: DC
  Administered 2017-03-04 – 2017-03-05 (×2): 81 mg via ORAL
  Filled 2017-03-03 (×2): qty 1

## 2017-03-03 MED ORDER — CLOPIDOGREL BISULFATE 75 MG PO TABS
ORAL_TABLET | ORAL | Status: AC
  Filled 2017-03-03: qty 1

## 2017-03-03 MED ORDER — NITROGLYCERIN 2 % TD OINT
TOPICAL_OINTMENT | TRANSDERMAL | Status: AC
  Filled 2017-03-03: qty 1

## 2017-03-03 MED ORDER — METOPROLOL TARTRATE 25 MG PO TABS
25.0000 mg | ORAL_TABLET | Freq: Two times a day (BID) | ORAL | Status: DC
  Administered 2017-03-03 – 2017-03-05 (×6): 25 mg via ORAL
  Filled 2017-03-03 (×5): qty 1

## 2017-03-03 MED ORDER — ALBUTEROL SULFATE (2.5 MG/3ML) 0.083% IN NEBU: 2.5000 mg | INHALATION_SOLUTION | RESPIRATORY_TRACT | Status: DC | PRN

## 2017-03-03 MED ORDER — IPRATROPIUM-ALBUTEROL 0.5-2.5 (3) MG/3ML IN SOLN
3.0000 mL | Freq: Four times a day (QID) | RESPIRATORY_TRACT | Status: DC
  Administered 2017-03-03 – 2017-03-06 (×12): 3 mL via RESPIRATORY_TRACT
  Filled 2017-03-03 (×11): qty 3

## 2017-03-03 MED ORDER — NITROGLYCERIN 2 % TD OINT
0.5000 [in_us] | TOPICAL_OINTMENT | Freq: Once | TRANSDERMAL | Status: AC
  Administered 2017-03-03: 0.5 [in_us] via TOPICAL

## 2017-03-03 MED ORDER — ASPIRIN 81 MG PO CHEW
324.0000 mg | CHEWABLE_TABLET | Freq: Once | ORAL | Status: AC
  Administered 2017-03-03: 324 mg via ORAL

## 2017-03-03 MED ORDER — FLUTICASONE FUROATE-VILANTEROL 100-25 MCG/INH IN AEPB
1.0000 | INHALATION_SPRAY | Freq: Every day | RESPIRATORY_TRACT | Status: DC
  Administered 2017-03-04 – 2017-03-06 (×3): 1 via RESPIRATORY_TRACT
  Filled 2017-03-03: qty 28

## 2017-03-03 MED ORDER — ONDANSETRON HCL 4 MG/2ML IJ SOLN: 4.0000 mg | Freq: Four times a day (QID) | INTRAMUSCULAR | Status: DC | PRN

## 2017-03-03 MED ORDER — POLYETHYLENE GLYCOL 3350 17 G PO PACK: 17.0000 g | PACK | Freq: Every day | ORAL | Status: DC | PRN

## 2017-03-03 MED ORDER — ATORVASTATIN CALCIUM 20 MG PO TABS
80.0000 mg | ORAL_TABLET | Freq: Every day | ORAL | Status: DC
  Administered 2017-03-04 – 2017-03-06 (×3): 80 mg via ORAL
  Filled 2017-03-03 (×3): qty 4

## 2017-03-03 MED ORDER — ASPIRIN 81 MG PO CHEW
CHEWABLE_TABLET | ORAL | Status: AC
  Filled 2017-03-03: qty 4

## 2017-03-03 MED ORDER — IPRATROPIUM-ALBUTEROL 0.5-2.5 (3) MG/3ML IN SOLN
RESPIRATORY_TRACT | Status: AC
  Filled 2017-03-03: qty 3

## 2017-03-03 MED ORDER — PREDNISONE 20 MG PO TABS
50.0000 mg | ORAL_TABLET | Freq: Every day | ORAL | Status: DC
  Administered 2017-03-04: 50 mg via ORAL
  Filled 2017-03-03: qty 1

## 2017-03-03 MED ORDER — ACETAMINOPHEN 650 MG RE SUPP: 650.0000 mg | Freq: Four times a day (QID) | RECTAL | Status: DC | PRN

## 2017-03-03 MED ORDER — ONDANSETRON HCL 4 MG PO TABS: 4.0000 mg | ORAL_TABLET | Freq: Four times a day (QID) | ORAL | Status: DC | PRN

## 2017-03-03 MED ORDER — ENOXAPARIN SODIUM 40 MG/0.4ML ~~LOC~~ SOLN
40.0000 mg | SUBCUTANEOUS | Status: DC
  Filled 2017-03-03 (×2): qty 0.4

## 2017-03-03 MED ORDER — SODIUM CHLORIDE 0.9% FLUSH
3.0000 mL | Freq: Two times a day (BID) | INTRAVENOUS | Status: DC
  Administered 2017-03-03 – 2017-03-06 (×6): 3 mL via INTRAVENOUS

## 2017-03-03 NOTE — ED Provider Notes (Signed)
[facility] Emergency Department Provider Note   ____________________________________________   First MD Initiated Contact with Patient 03/03/17 1413     (approximate)  I have reviewed the triage vital signs and the nursing notes.   HISTORY  Chief Complaint Chest Pain    HPI Rosezena SensorCurtis W Stafford is a 77 y.o. male coronary disease, COPD, hyperlipidemia hypertension, previous stents  Patient presents today reports for about a week's time is been expressing intermittent discomfort in his chest, worse for the last 4 days, he notices when he walks he gets a burning feeling in his chest begins feeling short of breath with exertion.  When he sitting still there is no issues.  No nausea vomiting no fevers or chills.  Does radiate slightly to his left upper chest at times.  Not associated with any abdominal pain.  No fevers.  Denies ongoing pain at present or shortness of breath, but does report that if he were to get up and walk even a short distance he would began experiencing the burning feeling in his chest and some shortness of breath.  He is concerned this could be a other problem with his heart disease had a previous "heart attack"    Past Medical History:  Diagnosis Date  . Asthma   . CAD (coronary artery disease)    a. 2002 Cath: Nonobs dzs per patient;  b. 11/2014 NSTEMI/PCI: LM nl, LAD 30p, 60p, 80d, D1/2/3 min irregs, RI min irregs, LCX, 8673m, OM1 min irregs, OM2 nl, OM3 50ost, RCA 40p, 100d (2.25x18 Xience Alpine DES), EF 55-65%; c. 01/2015 MV: nl EF, no ischemia.  [name]. COPD (chronic obstructive pulmonary disease) (HCC)   . Cough    SMOKER. DENIES FEVER/WHEEZING/ COUGH NONPRODUCTIVE  . Dyspnea    chronic  . Hyperlipidemia   . Hypertension   . Hypertensive heart disease   . Myocardial infarction (HCC)    2016  . Peripheral vascular disease (HCC)   . Tobacco abuse   . Tobacco abuse     Patient Active Problem List   Diagnosis Date Noted  . Back pain  06/03/2016  . Essential hypertension, benign 12/25/2015  . AAA (abdominal aortic aneurysm) without rupture (HCC) 12/25/2015  . Tobacco abuse   . Hyperlipidemia   . CAD (coronary artery disease)   . Hypertensive heart disease   . COPD (chronic obstructive pulmonary disease) (HCC)   . Angina pectoris (HCC)   . Coronary artery disease involving native coronary artery of native heart with angina pectoris with documented spasm (HCC)   . Smoker   . NSTEMI (non-ST elevated myocardial infarction) (HCC) 12/07/2014    Past Surgical History:  Procedure Laterality Date  . CARDIAC CATHETERIZATION    . CARDIAC CATHETERIZATION Bilateral 12/08/2014   Procedure: Left Heart Cath and Coronary Angiography;  Surgeon: [name], MD;  Location: [facility] INVASIVE CV LAB;  Service: Cardiovascular;  Laterality: Bilateral;  . CARDIAC CATHETERIZATION N/A 12/08/2014   Procedure: Coronary Stent Intervention;  Surgeon: [name], MD;  Location: [facility] INVASIVE CV LAB;  Service: Cardiovascular;  Laterality: N/A;  . CORONARY ANGIOPLASTY     stent 2016  . ENDOVASCULAR REPAIR/STENT GRAFT N/A 02/10/2016   Procedure: Endovascular Repair/Stent Graft;  Surgeon: Annice NeedyJason S Dew, MD;  Location: [facility] INVASIVE CV LAB;  Service: Cardiovascular;  Laterality: N/A;  . PERIPHERAL VASCULAR CATHETERIZATION Right 01/18/2016   Procedure: Embolization;  Surgeon: Annice NeedyJason S Dew, MD;  Location: [facility] INVASIVE CV LAB;  Service: Cardiovascular;  Laterality: Right;  .  SHOULDER SURGERY Left     Prior to Admission medications   Medication Sig Start Date End Date Taking? Authorizing Provider  aspirin EC 81 MG EC tablet Take 1 tablet (81 mg total) by mouth daily. 12/09/14   Enedina Finner, MD  atorvastatin (LIPITOR) 80 MG tablet Take 1 tablet (80 mg total) by mouth daily at 6 PM. 12/09/14   Enedina Finner, MD  clopidogrel (PLAVIX) 75 MG tablet TAKE ONE TABLET BY MOUTH ONCE DAILY 08/09/16   Iran Ouch, MD  fluticasone furoate-vilanterol (BREO  ELLIPTA) 100-25 MCG/INH AEPB Inhale 1 puff into the lungs daily.    [provider]  lisinopril-hydrochlorothiazide (PRINZIDE,ZESTORETIC) 20-25 MG tablet Take 1 tablet by mouth daily.    [provider]  metoprolol tartrate (LOPRESSOR) 50 MG tablet  04/04/16   [provider]  montelukast (SINGULAIR) 10 MG tablet Take 10 mg by mouth at bedtime.    [provider]  nitroGLYCERIN (NITROSTAT) 0.4 MG SL tablet Place 1 tablet (0.4 mg total) under the tongue every 5 (five) minutes as needed for chest pain. 12/09/14   Antonieta Iba, MD  varenicline (CHANTIX CONTINUING MONTH PAK) 1 MG tablet Take 1 tablet (1 mg total) by mouth 2 (two) times daily. Patient not taking: Reported on 08/08/2016 07/13/16   Iran Ouch, MD  varenicline (CHANTIX STARTING MONTH PAK) 0.5 MG X 11 & 1 MG X 42 tablet Take one 0.5 mg tab once daily for 3 days, then increase to one 0.5 mg tab twice daily for 4 days, then increase to one 1 mg tab twice daily. Patient not taking: Reported on 08/08/2016 06/13/16   Iran Ouch, MD    Allergies Patient has no known allergies.  Family History  Problem Relation Age of Onset  . Hypertension Mother   . Hypertension Father   . CAD Brother   . CAD Brother   . Stroke Sister   . Stroke Sister     Social History Social History   Tobacco Use  . Smoking status: Current Every Day Smoker    Packs/day: 1.00    Types: Cigarettes  . Smokeless tobacco: Never Used  Substance Use Topics  . Alcohol use: No  . Drug use: No    Review of Systems Constitutional: No fever/chills Eyes: No visual changes. ENT: No sore throat. Cardiovascular: See HPI Respiratory: See HPI Gastrointestinal: No abdominal pain.  No nausea, no vomiting.  No diarrhea.  No constipation. Genitourinary: Negative for dysuria. Musculoskeletal: Negative for back pain. Skin: Negative for rash. Neurological: Negative for headaches, focal weakness or  numbness.    ____________________________________________   PHYSICAL EXAM:  VITAL SIGNS: ED Triage Vitals  Enc Vitals Group     BP 03/03/17 1042 104/65     Pulse Rate 03/03/17 1042 73     Resp 03/03/17 1042 16     Temp 03/03/17 1042 98.7 F (37.1 C)     Temp Source 03/03/17 1042 Oral     SpO2 03/03/17 1042 94 %     Weight 03/03/17 1040 230 lb (104.3 kg)     Height 03/03/17 1040 6\' 2"  (1.88 m)     Head Circumference --      Peak Flow --      Pain Score 03/03/17 1040 3     Pain Loc --      Pain Edu? --      Excl. in GC? --     Constitutional: Alert and oriented. Well appearing and in no  acute distress. Eyes: Conjunctivae are normal. Head: Atraumatic. Nose: No congestion/rhinnorhea. Mouth/Throat: Mucous membranes are moist. Neck: No stridor.   Cardiovascular: Normal rate, regular rhythm. Grossly normal heart sounds.  Good peripheral circulation. Respiratory: Normal respiratory effort.  No retractions. Lungs CTAB. Gastrointestinal: Soft and nontender. No distention. Musculoskeletal: No lower extremity tenderness nor edema. Neurologic:  Normal speech and language. No gross focal neurologic deficits are appreciated.  Skin:  Skin is warm, dry and intact. No rash noted. Psychiatric: Mood and affect are normal. Speech and behavior are normal.  ____________________________________________   LABS (all labs ordered are listed, but only abnormal results are displayed)  Labs Reviewed  BASIC METABOLIC PANEL - Abnormal; Notable for the following components:      Result Value   Sodium 133 (*)    Potassium 3.2 (*)    Chloride 92 (*)    CO2 33 (*)    Glucose, Bld 135 (*)    All other components within normal limits  CBC - Abnormal; Notable for the following components:   Hemoglobin 10.5 (*)    HCT 37.2 (*)    MCV 68.9 (*)    MCH 19.4 (*)    MCHC 28.1 (*)    RDW 21.8 (*)    All other components within normal limits  TROPONIN I - Abnormal; Notable for the following  components:   Troponin I 0.03 (*)    All other components within normal limits   ____________________________________________  EKG  EKG reviewed by me at 10:40 AM Heart rate 95 QRS 100 QTC 450 Normal sinus rhythm, occasional PACs, no evidence of acute ischemia, ____________________________________________  RADIOLOGY  Chest x-ray reviewed, negative for acute ____________________________________________   PROCEDURES  Procedure(s) performed: None  Procedures  Critical Care performed: No  ____________________________________________   INITIAL IMPRESSION / ASSESSMENT AND PLAN / ED COURSE  Pertinent labs & imaging results that were available during my care of the patient were reviewed by me and considered in my medical decision making (see chart for details).  The patient presents for evaluation of intermittent chest discomfort.  Seems to be very exertional in nature some slight radiation and burning feeling.  Reports is concerning for possible cardiac cause, and given his strong coronary history he certainly at moderate to high risk for coronary disease.  His first troponin is only minimally elevated 0.03, in the setting of 3-4 days of intermittent discomfort with no ongoing active pain I will provide salicylates and nitrates at present but do not feel the patient warrants going on heparin at this time.  No evidence of STEMI at present.  Denies any associated abdominal pain.  No ripping or tearing.  No pain or descriptors that would sound like a complication with aortic aneurysm, acute dissection, pulmonary embolism or infectious etiology.  We will admit the patient for further workup and observation as his heart score is indicating elevated risk for coronary disease and he has known coronary disease.  Patient agreeable with plan for observation.  Case discussed with Dr. Elpidio Anis of hospialist services.       ____________________________________________   FINAL CLINICAL  IMPRESSION(S) / ED DIAGNOSES  Final diagnoses:  Chest pain with high risk for cardiac etiology      NEW MEDICATIONS STARTED DURING THIS VISIT:  New Prescriptions   No medications on file     Note:  This document was prepared using Dragon voice recognition software and may include unintentional dictation errors.     Sharyn Creamer, MD 03/03/17 [phone]

## 2017-03-03 NOTE — ED Triage Notes (Signed)
States pain is a burning pain with mild exertion.

## 2017-03-03 NOTE — H&P (Signed)
[facility] [place] [facility]   PATIENT NAME: Jacob Ford    MR#:  [id number]  DATE OF BIRTH:  [birth year]  DATE OF ADMISSION:  03/03/2017  PRIMARY CARE PHYSICIAN: Derwood KaplanEason, Ernest B, MD   REQUESTING/REFERRING PHYSICIAN: Dr. Fanny BienQuale  CHIEF COMPLAINT:   Chief Complaint  Patient presents with  . Chest Pain    HISTORY OF PRESENT ILLNESS:  Jacob Ford  is a 77 y.o. male with a known history of CAD, aortic aneurysm, hypertension, tobacco abuse with non-ST elevation MI in 2016 with PCI presents to the hospital complaining of exertional chest burning for the past few weeks.  This has been worse over the last week and his son encouraged him to come to the emergency room.  No recent change in medications.  Patient is on aspirin, Plavix, metoprolol, statin.  Here patient's troponin is normal and EKG shows no acute changes.  No arrhythmias on telemetry.  [facility] Dr. Kirke CorinArida. He had a Myoview stress test in 2017 which was normal. He also has had cough with mild wheezing but  PAST MEDICAL HISTORY:   Past Medical History:  Diagnosis Date  . Asthma   . CAD (coronary artery disease)    a. 2002 Cath: Nonobs dzs per patient;  b. 11/2014 NSTEMI/PCI: LM nl, LAD 30p, 60p, 80d, D1/2/3 min irregs, RI min irregs, LCX, 255m, OM1 min irregs, OM2 nl, OM3 50ost, RCA 40p, 100d (2.25x18 Xience Alpine DES), EF 55-65%; c. 01/2015 MV: nl EF, no ischemia.  [name]. COPD (chronic obstructive pulmonary disease) (HCC)   . Cough    SMOKER. DENIES FEVER/WHEEZING/ COUGH NONPRODUCTIVE  . Dyspnea    chronic  . Hyperlipidemia   . Hypertension   . Hypertensive heart disease   . Myocardial infarction (HCC)    2016  . Peripheral vascular disease (HCC)   . Tobacco abuse   . Tobacco abuse     PAST SURGICAL HISTORY:   Past Surgical History:  Procedure Laterality Date  . CARDIAC CATHETERIZATION    . CARDIAC CATHETERIZATION Bilateral 12/08/2014   Procedure: Left Heart Cath  and Coronary Angiography;  Surgeon: [name], MD;  Location: [facility] INVASIVE CV LAB;  Service: Cardiovascular;  Laterality: Bilateral;  . CARDIAC CATHETERIZATION N/A 12/08/2014   Procedure: Coronary Stent Intervention;  Surgeon: [name], MD;  Location: [facility] INVASIVE CV LAB;  Service: Cardiovascular;  Laterality: N/A;  . CORONARY ANGIOPLASTY     stent 2016  . ENDOVASCULAR REPAIR/STENT GRAFT N/A 02/10/2016   Procedure: Endovascular Repair/Stent Graft;  Surgeon: Annice NeedyJason S Dew, MD;  Location: [facility] INVASIVE CV LAB;  Service: Cardiovascular;  Laterality: N/A;  . PERIPHERAL VASCULAR CATHETERIZATION Right 01/18/2016   Procedure: Embolization;  Surgeon: Annice NeedyJason S Dew, MD;  Location: [facility] INVASIVE CV LAB;  Service: Cardiovascular;  Laterality: Right;  . SHOULDER SURGERY Left     SOCIAL HISTORY:   Social History   Tobacco Use  . Smoking status: Current Every Day Smoker    Packs/day: 1.00    Types: Cigarettes  . Smokeless tobacco: Never Used  Substance Use Topics  . Alcohol use: No    FAMILY HISTORY:   Family History  Problem Relation Age of Onset  . Hypertension Mother   . Hypertension Father   . CAD Brother   . CAD Brother   . Stroke Sister   . Stroke Sister     DRUG ALLERGIES:  No Known Allergies  REVIEW OF SYSTEMS:   Review of Systems  Constitutional: Positive for malaise/fatigue. Negative for chills and fever.  HENT: Negative for sore throat.   Eyes: Negative for blurred vision, double vision and pain.  Respiratory: Positive for cough and wheezing. Negative for hemoptysis and shortness of breath.   Cardiovascular: Positive for chest pain. Negative for palpitations, orthopnea and leg swelling.  Gastrointestinal: Negative for abdominal pain, constipation, diarrhea, heartburn, nausea and vomiting.  Genitourinary: Negative for dysuria and hematuria.  Musculoskeletal: Negative for back pain and joint pain.  Skin: Negative for rash.  Neurological: Positive for weakness.  Negative for sensory change, speech change, focal weakness and headaches.  Endo/Heme/Allergies: Does not bruise/bleed easily.  Psychiatric/Behavioral: Negative for depression. The patient is not nervous/anxious.     MEDICATIONS AT HOME:   Prior to Admission medications   Medication Sig Start Date End Date Taking? Authorizing Provider  aspirin EC 81 MG EC tablet Take 1 tablet (81 mg total) by mouth daily. 12/09/14   Enedina Finner, MD  atorvastatin (LIPITOR) 80 MG tablet Take 1 tablet (80 mg total) by mouth daily at 6 PM. 12/09/14   Enedina Finner, MD  clopidogrel (PLAVIX) 75 MG tablet TAKE ONE TABLET BY MOUTH ONCE DAILY 08/09/16   Iran Ouch, MD  fluticasone furoate-vilanterol (BREO ELLIPTA) 100-25 MCG/INH AEPB Inhale 1 puff into the lungs daily.    [provider]  lisinopril-hydrochlorothiazide (PRINZIDE,ZESTORETIC) 20-25 MG tablet Take 1 tablet by mouth daily.    [provider]  metoprolol tartrate (LOPRESSOR) 50 MG tablet  04/04/16   [provider]  montelukast (SINGULAIR) 10 MG tablet Take 10 mg by mouth at bedtime.    [provider]  nitroGLYCERIN (NITROSTAT) 0.4 MG SL tablet Place 1 tablet (0.4 mg total) under the tongue every 5 (five) minutes as needed for chest pain. 12/09/14   Antonieta Iba, MD  varenicline (CHANTIX CONTINUING MONTH PAK) 1 MG tablet Take 1 tablet (1 mg total) by mouth 2 (two) times daily. Patient not taking: Reported on 08/08/2016 07/13/16   Iran Ouch, MD  varenicline (CHANTIX STARTING MONTH PAK) 0.5 MG X 11 & 1 MG X 42 tablet Take one 0.5 mg tab once daily for 3 days, then increase to one 0.5 mg tab twice daily for 4 days, then increase to one 1 mg tab twice daily. Patient not taking: Reported on 08/08/2016 06/13/16   Iran Ouch, MD     VITAL SIGNS:  Blood pressure 104/65, pulse 73, temperature 98.7 F (37.1 C), temperature source Oral, resp. rate 16, height 6\' 2"  (1.88 m), weight 104.3 kg (230 lb), SpO2 94  %.  PHYSICAL EXAMINATION:  Physical Exam  GENERAL:  77 y.o.-year-old patient lying in the bed with no acute distress.  EYES: Pupils equal, round, reactive to light and accommodation. No scleral icterus. Extraocular muscles intact.  HEENT: Head atraumatic, normocephalic. Oropharynx and nasopharynx clear. No oropharyngeal erythema, moist oral mucosa  NECK:  Supple, no jugular venous distention. No thyroid enlargement, no tenderness.  LUNGS: decreased air entry in bilateral wheezing CARDIOVASCULAR: S1, S2 normal. No murmurs, rubs, or gallops.  ABDOMEN: Soft, nontender, nondistended. Bowel sounds present. No organomegaly or mass.  EXTREMITIES: No pedal edema, cyanosis, or clubbing. + 2 pedal & radial pulses b/l.   NEUROLOGIC: Cranial nerves II through XII are intact. No focal Motor or sensory deficits appreciated b/l PSYCHIATRIC: The patient is alert and oriented x 3. Good affect.  SKIN: No obvious rash, lesion, or ulcer.   LABORATORY PANEL:   CBC Recent Labs  Lab  03/03/17 1048  WBC 8.2  HGB 10.5*  HCT 37.2*  PLT 261   ------------------------------------------------------------------------------------------------------------------  Chemistries  Recent Labs  Lab 03/03/17 1048  NA 133*  K 3.2*  CL 92*  CO2 33*  GLUCOSE 135*  BUN 8  CREATININE 0.96  CALCIUM 8.9   ------------------------------------------------------------------------------------------------------------------  Cardiac Enzymes Recent Labs  Lab 03/03/17 1048  TROPONINI 0.03*   ------------------------------------------------------------------------------------------------------------------  RADIOLOGY:  Dg Chest 2 View  Result Date: 03/03/2017 CLINICAL DATA:  Chest pain and fatigue for 4 days. Cough. Shortness of breath. Smoker. EXAM: CHEST  2 VIEW COMPARISON:  12/05/2014 chest radiographs and CT FINDINGS: The cardiac silhouette is slightly enlarged. Aortic atherosclerosis is noted. Mild prominence of  the central pulmonary arteries is unchanged. There is slight pleural thickening along the right minor fissure. No confluent airspace opacity, overt pulmonary edema, pleural effusion, or pneumothorax is identified. No acute osseous abnormality is seen. IMPRESSION: No active cardiopulmonary disease. Electronically Signed   By: Sebastian Ache M.D.   On: 03/03/2017 11:08     IMPRESSION AND PLAN:   *Chest pain with exertion, burning.  With history of CAD, vascular problems and ongoing smoking raises concern for unstable angina.  But he also has mild wheezing and cough.  Could be due to acute bronchitis.  Will admit for overnight observation.  Repeat cardiac enzymes.  For his acute bronchitis we will start him on prednisone and scheduled nebulizers.  Will consult cardiology as he has this burning exertional pain going on for some time now.  *Hypertension.  Continue home medications  *Tobacco abuse.  Counseled to quit smoking for greater than 3 minutes.  All the records are reviewed and case discussed with ED provider. Management plans discussed with the patient, family and they are in agreement.  CODE STATUS: Full code TOTAL TIME TAKING CARE OF THIS PATIENT: 40 minutes.   Molinda Bailiff Emett Stapel M.D on 03/03/2017 at 3:25 PM  Between 7am to 6pm - Pager - [phone]  After 6pm go to [email or web address] - password [email or web address] [facility]  [facility] [place] [facility]  Office  [phone]  CC: Primary care physician; Derwood Kaplan, MD  Note: This dictation was prepared with Dragon dictation along with smaller phrase technology. Any transcriptional errors that result from this process are unintentional.

## 2017-03-03 NOTE — ED Triage Notes (Signed)
C/O chest pain and fatigue x 4 days.  Describes pain as a dull pain.  Also c/o productive cough.

## 2017-03-04 ENCOUNTER — Observation Stay (HOSPITAL_BASED_OUTPATIENT_CLINIC_OR_DEPARTMENT_OTHER): Payer: Medicare Other

## 2017-03-04 DIAGNOSIS — R079 Chest pain, unspecified: Secondary | ICD-10-CM

## 2017-03-04 DIAGNOSIS — R072 Precordial pain: Secondary | ICD-10-CM

## 2017-03-04 LAB — NM MYOCAR MULTI W/SPECT W/WALL MOTION / EF
CHL CUP NUCLEAR SSS: 11
LV dias vol: 112 mL (ref 62–150)
LV sys vol: 49 mL
Peak HR: 97 {beats}/min
Rest HR: 97 {beats}/min
SDS: 3
SRS: 8
TID: 1.3

## 2017-03-04 LAB — TROPONIN I: Troponin I: <0.03 ng/mL (ref <0.03)

## 2017-03-04 MED ORDER — POTASSIUM CHLORIDE CRYS ER 20 MEQ PO TBCR
40.0000 meq | EXTENDED_RELEASE_TABLET | Freq: Once | ORAL | Status: AC
  Administered 2017-03-04: 40 meq via ORAL
  Filled 2017-03-04: qty 2

## 2017-03-04 MED ORDER — NICOTINE 21 MG/24HR TD PT24
21.0000 mg | MEDICATED_PATCH | Freq: Every day | TRANSDERMAL | Status: DC
  Administered 2017-03-04 – 2017-03-06 (×3): 21 mg via TRANSDERMAL
  Filled 2017-03-04 (×3): qty 1

## 2017-03-04 MED ORDER — METHYLPREDNISOLONE SODIUM SUCC 125 MG IJ SOLR
60.0000 mg | Freq: Four times a day (QID) | INTRAMUSCULAR | Status: DC
  Administered 2017-03-04 – 2017-03-06 (×10): 60 mg via INTRAVENOUS
  Filled 2017-03-04 (×10): qty 2

## 2017-03-04 MED ORDER — GUAIFENESIN ER 600 MG PO TB12
600.0000 mg | ORAL_TABLET | Freq: Two times a day (BID) | ORAL | Status: DC
  Administered 2017-03-04 – 2017-03-06 (×5): 600 mg via ORAL
  Filled 2017-03-04 (×5): qty 1

## 2017-03-04 MED ORDER — DOXYCYCLINE HYCLATE 100 MG PO TABS
100.0000 mg | ORAL_TABLET | Freq: Two times a day (BID) | ORAL | Status: DC
  Administered 2017-03-04 – 2017-03-06 (×5): 100 mg via ORAL
  Filled 2017-03-04 (×5): qty 1

## 2017-03-04 MED ORDER — TECHNETIUM TC 99M TETROFOSMIN IV KIT
30.3900 | PACK | Freq: Once | INTRAVENOUS | Status: AC | PRN
  Administered 2017-03-04: 30.39 via INTRAVENOUS

## 2017-03-04 MED ORDER — TECHNETIUM TC 99M TETROFOSMIN IV KIT
13.1900 | PACK | Freq: Once | INTRAVENOUS | Status: AC | PRN
  Administered 2017-03-04: 13.19 via INTRAVENOUS

## 2017-03-04 MED ORDER — REGADENOSON 0.4 MG/5ML IV SOLN
0.4000 mg | Freq: Once | INTRAVENOUS | Status: AC
  Administered 2017-03-04: 0.4 mg via INTRAVENOUS

## 2017-03-04 NOTE — Consult Note (Signed)
Cardiology Consultation:   Patient ID: Jacob Ford; [id number]; [birth year]   Admit date: 03/03/2017 Date of Consult: 03/04/2017  Primary Care Provider: Derwood KaplanEason, Ernest B, MD Primary Cardiologist: Kirke CorinArida Primary Electrophysiologist:  none   Patient Profile:   Jacob Ford is a 77 y.o. male with a hx of CAD, s/p MI who is being seen today for the evaluation of chest pressure at the request of Allena Katzatel.  History of Present Illness:   [name] is a pleasant 77 yo man who has a h/o NSTEMI and prior PCI who presented with recurrent chest pain. He notes that his current symptoms have resolved. His cardiac enzymes have been negative and his symptoms are not the same as what he experienced with his acute coronary syndrome over 2 years ago.  He denies shortness of breath.  Past Medical History:  Diagnosis Date  . Asthma   . CAD (coronary artery disease)    a. 2002 Cath: Nonobs dzs per patient;  b. 11/2014 NSTEMI/PCI: LM nl, LAD 30p, 60p, 80d, D1/2/3 min irregs, RI min irregs, LCX, 7033m, OM1 min irregs, OM2 nl, OM3 50ost, RCA 40p, 100d (2.25x18 Xience Alpine DES), EF 55-65%; c. 01/2015 MV: nl EF, no ischemia.  [name]. COPD (chronic obstructive pulmonary disease) (HCC)   . Cough    SMOKER. DENIES FEVER/WHEEZING/ COUGH NONPRODUCTIVE  . Dyspnea    chronic  . Hyperlipidemia   . Hypertension   . Hypertensive heart disease   . Myocardial infarction (HCC)    2016  . Peripheral vascular disease (HCC)   . Tobacco abuse   . Tobacco abuse     Past Surgical History:  Procedure Laterality Date  . CARDIAC CATHETERIZATION    . CARDIAC CATHETERIZATION Bilateral 12/08/2014   Procedure: Left Heart Cath and Coronary Angiography;  Surgeon: [name], MD;  Location: [facility] INVASIVE CV LAB;  Service: Cardiovascular;  Laterality: Bilateral;  . CARDIAC CATHETERIZATION N/A 12/08/2014   Procedure: Coronary Stent Intervention;  Surgeon: [name], MD;  Location: [facility] INVASIVE CV LAB;  Service:  Cardiovascular;  Laterality: N/A;  . CORONARY ANGIOPLASTY     stent 2016  . ENDOVASCULAR REPAIR/STENT GRAFT N/A 02/10/2016   Procedure: Endovascular Repair/Stent Graft;  Surgeon: Annice NeedyJason S Dew, MD;  Location: [facility] INVASIVE CV LAB;  Service: Cardiovascular;  Laterality: N/A;  . PERIPHERAL VASCULAR CATHETERIZATION Right 01/18/2016   Procedure: Embolization;  Surgeon: Annice NeedyJason S Dew, MD;  Location: [facility] INVASIVE CV LAB;  Service: Cardiovascular;  Laterality: Right;  . SHOULDER SURGERY Left      Home Medications:  Prior to Admission medications   Medication Sig Start Date End Date Taking? Authorizing Provider  aspirin EC 81 MG EC tablet Take 1 tablet (81 mg total) by mouth daily. 12/09/14  Yes Enedina FinnerPatel, Sona, MD  clopidogrel (PLAVIX) 75 MG tablet TAKE ONE TABLET BY MOUTH ONCE DAILY 08/09/16  Yes Iran OuchArida, Muhammad A, MD  fluticasone furoate-vilanterol (BREO ELLIPTA) 100-25 MCG/INH AEPB Inhale 1 puff into the lungs daily.   Yes [provider]  lisinopril-hydrochlorothiazide (PRINZIDE,ZESTORETIC) 20-25 MG tablet Take 1 tablet by mouth daily.   Yes [provider]  metoprolol tartrate (LOPRESSOR) 50 MG tablet Take 50 mg by mouth 2 (two) times daily.  04/04/16  Yes [provider]  montelukast (SINGULAIR) 10 MG tablet Take 10 mg by mouth at bedtime.   Yes [provider]  nitroGLYCERIN (NITROSTAT) 0.4 MG SL tablet Place 1 tablet (0.4 mg total) under the tongue every 5 (five) minutes as needed  for chest pain. 12/09/14  Yes Antonieta Iba, MD  atorvastatin (LIPITOR) 80 MG tablet Take 1 tablet (80 mg total) by mouth daily at 6 PM. Patient not taking: Reported on 03/03/2017 12/09/14   Enedina Finner, MD  varenicline (CHANTIX CONTINUING MONTH PAK) 1 MG tablet Take 1 tablet (1 mg total) by mouth 2 (two) times daily. Patient not taking: Reported on 08/08/2016 07/13/16   Iran Ouch, MD  varenicline (CHANTIX STARTING MONTH PAK) 0.5 MG X 11 & 1 MG X 42 tablet Take one 0.5 mg tab once  daily for 3 days, then increase to one 0.5 mg tab twice daily for 4 days, then increase to one 1 mg tab twice daily. Patient not taking: Reported on 08/08/2016 06/13/16   Iran Ouch, MD    Inpatient Medications: Scheduled Meds: . aspirin EC  81 mg Oral Daily  . atorvastatin  80 mg Oral q1800  . clopidogrel  75 mg Oral Daily  . doxycycline  100 mg Oral Q12H  . enoxaparin (LOVENOX) injection  40 mg Subcutaneous Q24H  . fluticasone furoate-vilanterol  1 puff Inhalation Daily  . guaiFENesin  600 mg Oral BID  . ipratropium-albuterol  3 mL Nebulization Q6H  . methylPREDNISolone (SOLU-MEDROL) injection  60 mg Intravenous Q6H  . metoprolol tartrate  25 mg Oral BID  . montelukast  10 mg Oral QHS  . potassium chloride  40 mEq Oral Once  . sodium chloride flush  3 mL Intravenous Q12H   Continuous Infusions:  PRN Meds: acetaminophen **OR** acetaminophen, albuterol, nitroGLYCERIN, ondansetron **OR** ondansetron (ZOFRAN) IV, polyethylene glycol  Allergies:   No Known Allergies  Social History:   Social History   Socioeconomic History  . Marital status: Divorced    Spouse name: Not on file  . Number of children: Not on file  . Years of education: Not on file  . Highest education level: Not on file  Social Needs  . Financial resource strain: Not on file  . Food insecurity - worry: Not on file  . Food insecurity - inability: Not on file  . Transportation needs - medical: Not on file  . Transportation needs - non-medical: Not on file  Occupational History  . Not on file  Tobacco Use  . Smoking status: Current Every Day Smoker    Packs/day: 1.00    Types: Cigarettes  . Smokeless tobacco: Never Used  Substance and Sexual Activity  . Alcohol use: No  . Drug use: No  . Sexual activity: No  Other Topics Concern  . Not on file  Social History Narrative  . Not on file    Family History:    Family History  Problem Relation Age of Onset  . Hypertension Mother   . Hypertension  Father   . CAD Brother   . CAD Brother   . Stroke Sister   . Stroke Sister      ROS:  Please see the history of present illness.   All other ROS reviewed and negative.     Physical Exam/Data:   Vitals:   03/03/17 2122 03/04/17 0144 03/04/17 0502 03/04/17 0800  BP:   138/74   Pulse:   97   Resp:   17   Temp:   98.3 F (36.8 C)   TempSrc:   Oral   SpO2: 95% 97% 97% 91%  Weight:   222 lb 6.4 oz (100.9 kg)   Height:        Intake/Output Summary (Last 24 hours)  at 03/04/2017 1017 Last data filed at 03/04/2017 0400 Gross per 24 hour  Intake 240 ml  Output 600 ml  Net -360 ml   Filed Weights   03/03/17 1040 03/04/17 0502  Weight: 230 lb (104.3 kg) 222 lb 6.4 oz (100.9 kg)   Body mass index is 28.55 kg/m.  General:  Well nourished, well developed, in no acute distress HEENT: normal Lymph: no adenopathy Neck: 7 cm JVD Endocrine:  No thryomegaly Vascular: No carotid bruits; FA pulses 2+ bilaterally without bruits  Cardiac:  normal S1, S2; RRR; no murmur   Lungs:  clear to auscultation bilaterally, no wheezing, rhonchi or rales  Abd: soft, nontender, no hepatomegaly  Ext: Trace peripheral edema Musculoskeletal:  No deformities, BUE and BLE strength normal and equal Skin: warm and dry  Neuro:  CNs 2-12 intact, no focal abnormalities noted Psych:  Normal affect   EKG:  The EKG was personally reviewed and demonstrates: Normal sinus rhythm with PACs and PVCs Telemetry:  Telemetry was personally reviewed and demonstrates: Normal sinus rhythm with PACs and PVCs  Relevant CV Studies: Steffanie Dunn is pending  Laboratory Data:  Chemistry Recent Labs  Lab 03/03/17 1048  NA 133*  K 3.2*  CL 92*  CO2 33*  GLUCOSE 135*  BUN 8  CREATININE 0.96  CALCIUM 8.9  GFRNONAA >60  GFRAA >60  ANIONGAP 8    No results for input(s): PROT, ALBUMIN, AST, ALT, ALKPHOS, BILITOT in the last 168 hours. Hematology Recent Labs  Lab 03/03/17 1048  WBC 8.2  RBC 5.40  HGB  10.5*  HCT 37.2*  MCV 68.9*  MCH 19.4*  MCHC 28.1*  RDW 21.8*  PLT 261   Cardiac Enzymes Recent Labs  Lab 03/03/17 1048 03/03/17 1911 03/04/17 0040  TROPONINI 0.03* <0.03 <0.03   No results for input(s): TROPIPOC in the last 168 hours.  BNPNo results for input(s): BNP, PROBNP in the last 168 hours.  DDimer No results for input(s): DDIMER in the last 168 hours.  Radiology/Studies:  Dg Chest 2 View  Result Date: 03/03/2017 CLINICAL DATA:  Chest pain and fatigue for 4 days. Cough. Shortness of breath. Smoker. EXAM: CHEST  2 VIEW COMPARISON:  12/05/2014 chest radiographs and CT FINDINGS: The cardiac silhouette is slightly enlarged. Aortic atherosclerosis is noted. Mild prominence of the central pulmonary arteries is unchanged. There is slight pleural thickening along the right minor fissure. No confluent airspace opacity, overt pulmonary edema, pleural effusion, or pneumothorax is identified. No acute osseous abnormality is seen. IMPRESSION: No active cardiopulmonary disease. Electronically Signed   By: Sebastian Ache M.D.   On: 03/03/2017 11:08    Assessment and Plan:   1. Atypical chest pain his symptoms do not appear to be cardiac in nature.-His cardiac enzymes are negative.  He has ruled out for MI.  If his stress test is negative with low risk features, he can be discharged home.  If on the other hand he has evidence of ongoing ischemia, left heart catheterization would be recommended. 2. Coronary artery disease -he will continue his current medical therapy. 3. Hypertension -his blood pressure is minimally elevated.  Continue current medications.   For questions or updates, please contact [facility] Please consult [email or web address] for contact info under Cardiology/STEMI.   Signed, Lewayne Bunting, MD  03/04/2017 10:17 AM

## 2017-03-04 NOTE — Progress Notes (Addendum)
[employer] - [place] at [facility]                                                                                                                                                                                  Patient Demographics   Jacob Ford, is a 77 y.o. male, DOB - 19-Apr-1940, ZOX:[id number]  Admit date - 03/03/2017   Admitting Physician Milagros Loll, MD  Outpatient Primary MD for the patient is Derwood Kaplan, MD   LOS - 0  Subjective: Patient states complains of shortness of breath and cough And having some chest pain burning sensation   Review of Systems:   CONSTITUTIONAL: No documented fever. No fatigue, weakness. No weight gain, no weight loss.  EYES: No blurry or double vision.  ENT: No tinnitus. No postnasal drip. No redness of the oropharynx.  RESPIRATORY: Positive cough, positive wheeze, no hemoptysis. No dyspnea.  CARDIOVASCULAR: No chest pain. No orthopnea. No palpitations. No syncope.  GASTROINTESTINAL: No nausea, no vomiting or diarrhea. No abdominal pain. No melena or hematochezia.  GENITOURINARY: No dysuria or hematuria.  ENDOCRINE: No polyuria or nocturia. No heat or cold intolerance.  HEMATOLOGY: No anemia. No bruising. No bleeding.  INTEGUMENTARY: No rashes. No lesions.  MUSCULOSKELETAL: No arthritis. No swelling. No gout.  NEUROLOGIC: No numbness, tingling, or ataxia. No seizure-type activity.  PSYCHIATRIC: No anxiety. No insomnia. No ADD.    Vitals:   Vitals:   03/04/17 0144 03/04/17 0502 03/04/17 0800 03/04/17 1054  BP:  138/74  (!) 143/80  Pulse:  97  93  Resp:  17  18  Temp:  98.3 F (36.8 C)  98.1 F (36.7 C)  TempSrc:  Oral  Oral  SpO2: 97% 97% 91% 95%  Weight:  222 lb 6.4 oz (100.9 kg)    Height:        Wt Readings from Last 3 Encounters:  03/04/17 222 lb 6.4 oz (100.9 kg)  08/08/16 210 lb (95.3 kg)  06/20/16 205 lb (93 kg)     Intake/Output Summary (Last 24 hours) at 03/04/2017 1559 Last data filed at  03/04/2017 1410 Gross per 24 hour  Intake 480 ml  Output 800 ml  Net -320 ml    Physical Exam:   GENERAL: Pleasant-appearing in no apparent distress.  HEAD, EYES, EARS, NOSE AND THROAT: Atraumatic, normocephalic. Extraocular muscles are intact. Pupils equal and reactive to light. Sclerae anicteric. No conjunctival injection. No oro-pharyngeal erythema.  NECK: Supple. There is no jugular venous distention. No bruits, no lymphadenopathy, no thyromegaly.  HEART: Regular rate and rhythm,. No murmurs, no rubs, no clicks.  LUNGS: Bilateral wheezing throughout both lung ABDOMEN: Soft, flat, nontender, nondistended. Has  good bowel sounds. No hepatosplenomegaly appreciated.  EXTREMITIES: No evidence of any cyanosis, clubbing, or peripheral edema.  +2 pedal and radial pulses bilaterally.  NEUROLOGIC: The patient is alert, awake, and oriented x3 with no focal motor or sensory deficits appreciated bilaterally.  SKIN: Moist and warm with no rashes appreciated.  Psych: Not anxious, depressed LN: No inguinal LN enlargement    Antibiotics   Anti-infectives (From admission, onward)   Start     Dose/Rate Route Frequency Ordered Stop   03/04/17 1000  doxycycline (VIBRA-TABS) tablet 100 mg     100 mg Oral Every 12 hours 03/04/17 0832        Medications   Scheduled Meds: . aspirin EC  81 mg Oral Daily  . atorvastatin  80 mg Oral q1800  . clopidogrel  75 mg Oral Daily  . doxycycline  100 mg Oral Q12H  . enoxaparin (LOVENOX) injection  40 mg Subcutaneous Q24H  . fluticasone furoate-vilanterol  1 puff Inhalation Daily  . guaiFENesin  600 mg Oral BID  . ipratropium-albuterol  3 mL Nebulization Q6H  . methylPREDNISolone (SOLU-MEDROL) injection  60 mg Intravenous Q6H  . metoprolol tartrate  25 mg Oral BID  . montelukast  10 mg Oral QHS  . sodium chloride flush  3 mL Intravenous Q12H   Continuous Infusions: PRN Meds:.acetaminophen **OR** acetaminophen, albuterol, nitroGLYCERIN, ondansetron **OR**  ondansetron (ZOFRAN) IV, polyethylene glycol   Data Review:   Micro Results No results found for this or any previous visit (from the past 240 hour(s)).  Radiology Reports Dg Chest 2 View  Result Date: 03/03/2017 CLINICAL DATA:  Chest pain and fatigue for 4 days. Cough. Shortness of breath. Smoker. EXAM: CHEST  2 VIEW COMPARISON:  12/05/2014 chest radiographs and CT FINDINGS: The cardiac silhouette is slightly enlarged. Aortic atherosclerosis is noted. Mild prominence of the central pulmonary arteries is unchanged. There is slight pleural thickening along the right minor fissure. No confluent airspace opacity, overt pulmonary edema, pleural effusion, or pneumothorax is identified. No acute osseous abnormality is seen. IMPRESSION: No active cardiopulmonary disease. Electronically Signed   By: Sebastian AcheAllen  Grady M.D.   On: 03/03/2017 11:08   Nm Myocar Multi W/spect W/wall Motion / Ef  Result Date: 03/04/2017 Pharmacological myocardial perfusion imaging study with ischemia noted in the mid to apical inferior and inferolateral wall There is a small region of mild fixed defect on resting attenuation corrected images in the periapical inferolateral wall. Non-attenuation corrected images with large predominantly fixed inferior and inferolateral wall defect, mild ischemia noted. Inferior wall hypokinesis noted, EF estimated at 56% No EKG changes concerning for ischemia at peak stress or in recovery. High risk scan Compared to previous study dated 01/2015, there is now large perfusion defect inferior and inferolateral region. Prior cardiac cath 12/2014 Signed, Dossie Arbourim Gollan, MD, Ph.D [facility]     CBC Recent Labs  Lab 03/03/17 1048  WBC 8.2  HGB 10.5*  HCT 37.2*  PLT 261  MCV 68.9*  MCH 19.4*  MCHC 28.1*  RDW 21.8*    Chemistries  Recent Labs  Lab 03/03/17 1048  NA 133*  K 3.2*  CL 92*  CO2 33*  GLUCOSE 135*  BUN 8  CREATININE 0.96  CALCIUM 8.9    ------------------------------------------------------------------------------------------------------------------ estimated creatinine clearance is 83.1 mL/min (by C-G formula based on SCr of 0.96 mg/dL). ------------------------------------------------------------------------------------------------------------------ No results for input(s): HGBA1C in the last 72 hours. ------------------------------------------------------------------------------------------------------------------ No results for input(s): CHOL, HDL, LDLCALC, TRIG, CHOLHDL, LDLDIRECT in the last 72 hours. ------------------------------------------------------------------------------------------------------------------  No results for input(s): TSH, T4TOTAL, T3FREE, THYROIDAB in the last 72 hours.  Invalid input(s): FREET3 ------------------------------------------------------------------------------------------------------------------ No results for input(s): VITAMINB12, FOLATE, FERRITIN, TIBC, IRON, RETICCTPCT in the last 72 hours.  Coagulation profile No results for input(s): INR, PROTIME in the last 168 hours.  No results for input(s): DDIMER in the last 72 hours.  Cardiac Enzymes Recent Labs  Lab 03/03/17 1048 03/03/17 1911 03/04/17 0040  TROPONINI 0.03* <0.03 <0.03   ------------------------------------------------------------------------------------------------------------------ Invalid input(s): POCBNP    Assessment & Plan  Patient 77 year old admitted with chest pain 1.  Chest pain with abnormal stress test with large inferior defect I have discussed with cardiology they were planning to do a cardiac cath on Monday Continue therapy with aspirin and Plavix  2.  Acute bronchospasm with acute bronchitis  I have placed patient on nebulizer therapy Continue his inhalers Oral antibiotics  3.  Essential hypertension blood pressure stable continue home medication  4.  Tobacco use abuse smoking  cessation provided I strongly recommended he stop smoking 4 min spent nicotine patch will be started  5.  Miscellaneous Lovenox for DVT prophylaxis     Code Status Orders  (From admission, onward)        Start     Ordered   03/03/17 1441  Full code  Continuous     03/03/17 1441    Code Status History    Date Active Date Inactive Code Status Order ID Comments User Context   02/10/2016 12:27 02/11/2016 21:38 Full Code [id number]  Annice Needy, MD Inpatient   01/18/2016 11:20 01/18/2016 16:16 Full Code [id number]  Annice Needy, MD Inpatient   12/08/2014 10:02 12/09/2014 20:53 Full Code [id number]  Iran Ouch, MD Inpatient   12/07/2014 20:22 12/08/2014 10:02 Full Code [id number]  Adrian Saran, MD Inpatient           Consults cardiology  DVT Prophylaxis  Lovenox   Lab Results  Component Value Date   PLT 261 03/03/2017     Time Spent in minutes 35 minutes  Greater than 50% of time spent in care coordination and counseling patient regarding the condition and plan of care.   Auburn Bilberry M.D on 03/04/2017 at 3:59 PM  Between 7am to 6pm - Pager - [phone]  After 6pm go to [email or web address] - password [email or web address] [facility]  [facility]   Office  [phone]

## 2017-03-04 NOTE — Progress Notes (Signed)
Stress test this AM Shows regions of ischemia in the inferior and inferolateral wall. Change since previous study dated 12/2015 Also with unstable angina symptoms, known CAD, previous RCA stent Would consider cardiac cath with Dr. Kirke CorinArida on Monday  Signed, Dossie Arbourim Gollan, MD, Ph.D [facility]

## 2017-03-04 NOTE — Progress Notes (Signed)
CCMD reports a 2.11 sec pause followed by changes in pt rhythm. Pt asymptomatic. MD Sheryle Hailiamond made aware. No new orders, will continue to monitor

## 2017-03-04 NOTE — Progress Notes (Addendum)
Pt. Had 11 beat ruin of v-tach, pt. Alert and oriented with no SOB, pain or distress noted. Will continue to monitor pt. Prime paged, Dr. Anne HahnWillis, no new orders at this time

## 2017-03-04 NOTE — Progress Notes (Signed)
Pt. Refuses bed alarm. Pt. educated 

## 2017-03-05 DIAGNOSIS — R072 Precordial pain: Secondary | ICD-10-CM | POA: Diagnosis not present

## 2017-03-05 MED ORDER — SODIUM CHLORIDE 0.9 % WEIGHT BASED INFUSION
3.0000 mL/kg/h | INTRAVENOUS | Status: DC
  Administered 2017-03-06: 3 mL/kg/h via INTRAVENOUS

## 2017-03-05 MED ORDER — SODIUM CHLORIDE 0.9% FLUSH: 3.0000 mL | INTRAVENOUS | Status: DC | PRN

## 2017-03-05 MED ORDER — SODIUM CHLORIDE 0.9 % IV SOLN: 250.0000 mL | INTRAVENOUS | Status: DC | PRN

## 2017-03-05 MED ORDER — ASPIRIN 81 MG PO CHEW
81.0000 mg | CHEWABLE_TABLET | ORAL | Status: AC
  Administered 2017-03-06: 81 mg via ORAL
  Filled 2017-03-05: qty 1

## 2017-03-05 MED ORDER — SODIUM CHLORIDE 0.9% FLUSH
3.0000 mL | Freq: Two times a day (BID) | INTRAVENOUS | Status: DC
  Administered 2017-03-05: 3 mL via INTRAVENOUS

## 2017-03-05 MED ORDER — SODIUM CHLORIDE 0.9 % WEIGHT BASED INFUSION
1.0000 mL/kg/h | INTRAVENOUS | Status: DC
  Administered 2017-03-06: 1 mL/kg/h via INTRAVENOUS

## 2017-03-05 NOTE — Progress Notes (Signed)
Pt. Had c/o burning in central chest with no radiation. 1 nitro tablet given pain resolved. Will continue to monitor pt.

## 2017-03-05 NOTE — Progress Notes (Signed)
Bilateral groin prepped, second IV started in left forearm.

## 2017-03-05 NOTE — Care Management Obs Status (Signed)
MEDICARE OBSERVATION STATUS NOTIFICATION   Patient Details  Name: Jacob Ford MRN: [id number] Date of Birth: 1940-12-11   Medicare Observation Status Notification Given:  Yes    Malaijah Houchen A, RN 03/05/2017, 4:17 PM

## 2017-03-05 NOTE — Progress Notes (Signed)
Progress Note  Patient Name: Jacob SensorCurtis W Sferrazza Date of Encounter: 03/05/2017  Primary Cardiologist: No primary care provider on file.   Subjective   Had one episode of chest discomfort yesterday.  Now resolved.  Inpatient Medications    Scheduled Meds: . aspirin EC  81 mg Oral Daily  . atorvastatin  80 mg Oral q1800  . clopidogrel  75 mg Oral Daily  . doxycycline  100 mg Oral Q12H  . enoxaparin (LOVENOX) injection  40 mg Subcutaneous Q24H  . fluticasone furoate-vilanterol  1 puff Inhalation Daily  . guaiFENesin  600 mg Oral BID  . ipratropium-albuterol  3 mL Nebulization Q6H  . methylPREDNISolone (SOLU-MEDROL) injection  60 mg Intravenous Q6H  . metoprolol tartrate  25 mg Oral BID  . montelukast  10 mg Oral QHS  . nicotine  21 mg Transdermal Daily  . sodium chloride flush  3 mL Intravenous Q12H   Continuous Infusions:  PRN Meds: acetaminophen **OR** acetaminophen, albuterol, nitroGLYCERIN, ondansetron **OR** ondansetron (ZOFRAN) IV, polyethylene glycol   Vital Signs    Vitals:   03/05/17 0336 03/05/17 0514 03/05/17 0521 03/05/17 1037  BP: 134/66 (!) 157/73 127/83 128/78  Pulse: 100 (!) 108 (!) 106 (!) 106  Resp: 17 18  16   Temp: (!) 97.5 F (36.4 C)   98.1 F (36.7 C)  TempSrc: Oral   Oral  SpO2: 90% 99%  94%  Weight: 222 lb (100.7 kg)     Height:        Intake/Output Summary (Last 24 hours) at 03/05/2017 1046 Last data filed at 03/05/2017 0941 Gross per 24 hour  Intake 720 ml  Output 1150 ml  Net -430 ml   Filed Weights   03/03/17 1040 03/04/17 0502 03/05/17 0336  Weight: 230 lb (104.3 kg) 222 lb 6.4 oz (100.9 kg) 222 lb (100.7 kg)    Telemetry    Normal sinus rhythm- Personally Reviewed  ECG    Normal sinus rhythm- Personally Reviewed  Physical Exam   GEN: No acute distress.   Neck:  6 cm JVD Cardiac: RRR, no murmurs, rubs, or gallops.  Respiratory: Clear to auscultation bilaterally. GI: Soft, nontender, non-distended  MS: No edema; No  deformity. Neuro:  Nonfocal  Psych: Normal affect   Labs    Chemistry Recent Labs  Lab 03/03/17 1048  NA 133*  K 3.2*  CL 92*  CO2 33*  GLUCOSE 135*  BUN 8  CREATININE 0.96  CALCIUM 8.9  GFRNONAA >60  GFRAA >60  ANIONGAP 8     Hematology Recent Labs  Lab 03/03/17 1048  WBC 8.2  RBC 5.40  HGB 10.5*  HCT 37.2*  MCV 68.9*  MCH 19.4*  MCHC 28.1*  RDW 21.8*  PLT 261    Cardiac Enzymes Recent Labs  Lab 03/03/17 1048 03/03/17 1911 03/04/17 0040  TROPONINI 0.03* <0.03 <0.03   No results for input(s): TROPIPOC in the last 168 hours.   BNPNo results for input(s): BNP, PROBNP in the last 168 hours.   DDimer No results for input(s): DDIMER in the last 168 hours.   Radiology    Dg Chest 2 View  Result Date: 03/03/2017 CLINICAL DATA:  Chest pain and fatigue for 4 days. Cough. Shortness of breath. Smoker. EXAM: CHEST  2 VIEW COMPARISON:  12/05/2014 chest radiographs and CT FINDINGS: The cardiac silhouette is slightly enlarged. Aortic atherosclerosis is noted. Mild prominence of the central pulmonary arteries is unchanged. There is slight pleural thickening along the right minor fissure. No  confluent airspace opacity, overt pulmonary edema, pleural effusion, or pneumothorax is identified. No acute osseous abnormality is seen. IMPRESSION: No active cardiopulmonary disease. Electronically Signed   By: Sebastian Ache M.D.   On: 03/03/2017 11:08   Nm Myocar Multi W/spect W/wall Motion / Ef  Result Date: 03/04/2017 Pharmacological myocardial perfusion imaging study with ischemia noted in the mid to apical inferior and inferolateral wall There is a small region of mild fixed defect on resting attenuation corrected images in the periapical inferolateral wall. Non-attenuation corrected images with large predominantly fixed inferior and inferolateral wall defect, mild ischemia noted. Inferior wall hypokinesis noted, EF estimated at 56% No EKG changes concerning for ischemia at peak  stress or in recovery. High risk scan Compared to previous study dated 01/2015, there is now large perfusion defect inferior and inferolateral region. Prior cardiac cath 12/2014 Signed, Dossie Arbour, MD, Ph.D [facility]    Cardiac Studies   Nuclear stress test -inferolateral scar and ischemia, new since prior study  Patient Profile     77 y.o. male admitted with chest discomfort and found to have an abnormal stress test.  Multiple cardiac risk factors  Assessment & Plan    1.  Unstable angina -his stress test was abnormal.  He will undergo left heart catheterization tomorrow.  He will be n.p.o. after midnight. 2.  Hypertension -he will continue his current medical therapy.   For questions or updates, please contact [facility] Please consult [email or web address] for contact info under Cardiology/STEMI.      Signed, Lewayne Bunting, MD  03/05/2017, 10:46 AM  Patient ID: Jacob Ford, male   DOB: [birth year], 77 y.o.   MRN: [id number]

## 2017-03-05 NOTE — H&P (View-Only) (Signed)
Progress Note  Patient Name: Jacob SensorCurtis W Sferrazza Date of Encounter: 03/05/2017  Primary Cardiologist: No primary care provider on file.   Subjective   Had one episode of chest discomfort yesterday.  Now resolved.  Inpatient Medications    Scheduled Meds: . aspirin EC  81 mg Oral Daily  . atorvastatin  80 mg Oral q1800  . clopidogrel  75 mg Oral Daily  . doxycycline  100 mg Oral Q12H  . enoxaparin (LOVENOX) injection  40 mg Subcutaneous Q24H  . fluticasone furoate-vilanterol  1 puff Inhalation Daily  . guaiFENesin  600 mg Oral BID  . ipratropium-albuterol  3 mL Nebulization Q6H  . methylPREDNISolone (SOLU-MEDROL) injection  60 mg Intravenous Q6H  . metoprolol tartrate  25 mg Oral BID  . montelukast  10 mg Oral QHS  . nicotine  21 mg Transdermal Daily  . sodium chloride flush  3 mL Intravenous Q12H   Continuous Infusions:  PRN Meds: acetaminophen **OR** acetaminophen, albuterol, nitroGLYCERIN, ondansetron **OR** ondansetron (ZOFRAN) IV, polyethylene glycol   Vital Signs    Vitals:   03/05/17 0336 03/05/17 0514 03/05/17 0521 03/05/17 1037  BP: 134/66 (!) 157/73 127/83 128/78  Pulse: 100 (!) 108 (!) 106 (!) 106  Resp: 17 18  16   Temp: (!) 97.5 F (36.4 C)   98.1 F (36.7 C)  TempSrc: Oral   Oral  SpO2: 90% 99%  94%  Weight: 222 lb (100.7 kg)     Height:        Intake/Output Summary (Last 24 hours) at 03/05/2017 1046 Last data filed at 03/05/2017 0941 Gross per 24 hour  Intake 720 ml  Output 1150 ml  Net -430 ml   Filed Weights   03/03/17 1040 03/04/17 0502 03/05/17 0336  Weight: 230 lb (104.3 kg) 222 lb 6.4 oz (100.9 kg) 222 lb (100.7 kg)    Telemetry    Normal sinus rhythm- Personally Reviewed  ECG    Normal sinus rhythm- Personally Reviewed  Physical Exam   GEN: No acute distress.   Neck:  6 cm JVD Cardiac: RRR, no murmurs, rubs, or gallops.  Respiratory: Clear to auscultation bilaterally. GI: Soft, nontender, non-distended  MS: No edema; No  deformity. Neuro:  Nonfocal  Psych: Normal affect   Labs    Chemistry Recent Labs  Lab 03/03/17 1048  NA 133*  K 3.2*  CL 92*  CO2 33*  GLUCOSE 135*  BUN 8  CREATININE 0.96  CALCIUM 8.9  GFRNONAA >60  GFRAA >60  ANIONGAP 8     Hematology Recent Labs  Lab 03/03/17 1048  WBC 8.2  RBC 5.40  HGB 10.5*  HCT 37.2*  MCV 68.9*  MCH 19.4*  MCHC 28.1*  RDW 21.8*  PLT 261    Cardiac Enzymes Recent Labs  Lab 03/03/17 1048 03/03/17 1911 03/04/17 0040  TROPONINI 0.03* <0.03 <0.03   No results for input(s): TROPIPOC in the last 168 hours.   BNPNo results for input(s): BNP, PROBNP in the last 168 hours.   DDimer No results for input(s): DDIMER in the last 168 hours.   Radiology    Dg Chest 2 View  Result Date: 03/03/2017 CLINICAL DATA:  Chest pain and fatigue for 4 days. Cough. Shortness of breath. Smoker. EXAM: CHEST  2 VIEW COMPARISON:  12/05/2014 chest radiographs and CT FINDINGS: The cardiac silhouette is slightly enlarged. Aortic atherosclerosis is noted. Mild prominence of the central pulmonary arteries is unchanged. There is slight pleural thickening along the right minor fissure. No  confluent airspace opacity, overt pulmonary edema, pleural effusion, or pneumothorax is identified. No acute osseous abnormality is seen. IMPRESSION: No active cardiopulmonary disease. Electronically Signed   By: Sebastian Ache M.D.   On: 03/03/2017 11:08   Nm Myocar Multi W/spect W/wall Motion / Ef  Result Date: 03/04/2017 Pharmacological myocardial perfusion imaging study with ischemia noted in the mid to apical inferior and inferolateral wall There is a small region of mild fixed defect on resting attenuation corrected images in the periapical inferolateral wall. Non-attenuation corrected images with large predominantly fixed inferior and inferolateral wall defect, mild ischemia noted. Inferior wall hypokinesis noted, EF estimated at 56% No EKG changes concerning for ischemia at peak  stress or in recovery. High risk scan Compared to previous study dated 01/2015, there is now large perfusion defect inferior and inferolateral region. Prior cardiac cath 12/2014 Signed, Dossie Arbour, MD, Ph.D [facility]    Cardiac Studies   Nuclear stress test -inferolateral scar and ischemia, new since prior study  Patient Profile     77 y.o. male admitted with chest discomfort and found to have an abnormal stress test.  Multiple cardiac risk factors  Assessment & Plan    1.  Unstable angina -his stress test was abnormal.  He will undergo left heart catheterization tomorrow.  He will be n.p.o. after midnight. 2.  Hypertension -he will continue his current medical therapy.   For questions or updates, please contact [facility] Please consult [email or web address] for contact info under Cardiology/STEMI.      Signed, Lewayne Bunting, MD  03/05/2017, 10:46 AM  Patient ID: Jacob Ford, male   DOB: [birth year], 77 y.o.   MRN: [id number]

## 2017-03-05 NOTE — Progress Notes (Signed)
[facility] [place] [facility]                                                                                                                                                                                  Patient Demographics   Jacob Ford, is a 77 y.o. male, DOB - Oct 16, 1940, ZOX:[id number]  Admit date - 03/03/2017   Admitting Physician Milagros Loll, MD  Outpatient Primary MD for the patient is Derwood Kaplan, MD   LOS - 0  Subjective: Patient continues to have intermittent burning sensation    Review of Systems:   CONSTITUTIONAL: No documented fever. No fatigue, weakness. No weight gain, no weight loss.  EYES: No blurry or double vision.  ENT: No tinnitus. No postnasal drip. No redness of the oropharynx.  RESPIRATORY: Positive cough, positive wheeze, no hemoptysis. No dyspnea.  CARDIOVASCULAR: Positive chest pain. No orthopnea. No palpitations. No syncope.  GASTROINTESTINAL: No nausea, no vomiting or diarrhea. No abdominal pain. No melena or hematochezia.  GENITOURINARY: No dysuria or hematuria.  ENDOCRINE: No polyuria or nocturia. No heat or cold intolerance.  HEMATOLOGY: No anemia. No bruising. No bleeding.  INTEGUMENTARY: No rashes. No lesions.  MUSCULOSKELETAL: No arthritis. No swelling. No gout.  NEUROLOGIC: No numbness, tingling, or ataxia. No seizure-type activity.  PSYCHIATRIC: No anxiety. No insomnia. No ADD.    Vitals:   Vitals:   03/05/17 0336 03/05/17 0514 03/05/17 0521 03/05/17 1037  BP: 134/66 (!) 157/73 127/83 128/78  Pulse: 100 (!) 108 (!) 106 (!) 106  Resp: 17 18  16   Temp: (!) 97.5 F (36.4 C)   98.1 F (36.7 C)  TempSrc: Oral   Oral  SpO2: 90% 99%  94%  Weight: 222 lb (100.7 kg)     Height:        Wt Readings from Last 3 Encounters:  03/05/17 222 lb (100.7 kg)  08/08/16 210 lb (95.3 kg)  06/20/16 205 lb (93 kg)     Intake/Output Summary (Last 24 hours) at 03/05/2017 1636 Last data filed at 03/05/2017 1411 Gross per  24 hour  Intake 720 ml  Output 1525 ml  Net -805 ml    Physical Exam:   GENERAL: Pleasant-appearing in no apparent distress.  HEAD, EYES, EARS, NOSE AND THROAT: Atraumatic, normocephalic. Extraocular muscles are intact. Pupils equal and reactive to light. Sclerae anicteric. No conjunctival injection. No oro-pharyngeal erythema.  NECK: Supple. There is no jugular venous distention. No bruits, no lymphadenopathy, no thyromegaly.  HEART: Regular rate and rhythm,. No murmurs, no rubs, no clicks.  LUNGS: Bilateral wheezing throughout both lung ABDOMEN: Soft, flat, nontender, nondistended. Has good bowel sounds. No hepatosplenomegaly appreciated.  EXTREMITIES:  No evidence of any cyanosis, clubbing, or peripheral edema.  +2 pedal and radial pulses bilaterally.  NEUROLOGIC: The patient is alert, awake, and oriented x3 with no focal motor or sensory deficits appreciated bilaterally.  SKIN: Moist and warm with no rashes appreciated.  Psych: Not anxious, depressed LN: No inguinal LN enlargement    Antibiotics   Anti-infectives (From admission, onward)   Start     Dose/Rate Route Frequency Ordered Stop   03/04/17 1000  doxycycline (VIBRA-TABS) tablet 100 mg     100 mg Oral Every 12 hours 03/04/17 0832        Medications   Scheduled Meds: . [START ON 03/06/2017] aspirin  81 mg Oral Pre-Cath  . aspirin EC  81 mg Oral Daily  . atorvastatin  80 mg Oral q1800  . clopidogrel  75 mg Oral Daily  . doxycycline  100 mg Oral Q12H  . enoxaparin (LOVENOX) injection  40 mg Subcutaneous Q24H  . fluticasone furoate-vilanterol  1 puff Inhalation Daily  . guaiFENesin  600 mg Oral BID  . ipratropium-albuterol  3 mL Nebulization Q6H  . methylPREDNISolone (SOLU-MEDROL) injection  60 mg Intravenous Q6H  . metoprolol tartrate  25 mg Oral BID  . montelukast  10 mg Oral QHS  . nicotine  21 mg Transdermal Daily  . sodium chloride flush  3 mL Intravenous Q12H  . sodium chloride flush  3 mL Intravenous Q12H    Continuous Infusions: . sodium chloride    . [START ON 03/06/2017] sodium chloride     Followed by  . [START ON 03/06/2017] sodium chloride     PRN Meds:.sodium chloride, acetaminophen **OR** acetaminophen, albuterol, nitroGLYCERIN, ondansetron **OR** ondansetron (ZOFRAN) IV, polyethylene glycol, sodium chloride flush   Data Review:   Micro Results No results found for this or any previous visit (from the past 240 hour(s)).  Radiology Reports Dg Chest 2 View  Result Date: 03/03/2017 CLINICAL DATA:  Chest pain and fatigue for 4 days. Cough. Shortness of breath. Smoker. EXAM: CHEST  2 VIEW COMPARISON:  12/05/2014 chest radiographs and CT FINDINGS: The cardiac silhouette is slightly enlarged. Aortic atherosclerosis is noted. Mild prominence of the central pulmonary arteries is unchanged. There is slight pleural thickening along the right minor fissure. No confluent airspace opacity, overt pulmonary edema, pleural effusion, or pneumothorax is identified. No acute osseous abnormality is seen. IMPRESSION: No active cardiopulmonary disease. Electronically Signed   By: Sebastian AcheAllen  Grady M.D.   On: 03/03/2017 11:08   Nm Myocar Multi W/spect W/wall Motion / Ef  Result Date: 03/04/2017 Pharmacological myocardial perfusion imaging study with ischemia noted in the mid to apical inferior and inferolateral wall There is a small region of mild fixed defect on resting attenuation corrected images in the periapical inferolateral wall. Non-attenuation corrected images with large predominantly fixed inferior and inferolateral wall defect, mild ischemia noted. Inferior wall hypokinesis noted, EF estimated at 56% No EKG changes concerning for ischemia at peak stress or in recovery. High risk scan Compared to previous study dated 01/2015, there is now large perfusion defect inferior and inferolateral region. Prior cardiac cath 12/2014 Signed, Dossie Arbourim Gollan, MD, Ph.D [facility]     CBC Recent Labs  Lab 03/03/17 1048   WBC 8.2  HGB 10.5*  HCT 37.2*  PLT 261  MCV 68.9*  MCH 19.4*  MCHC 28.1*  RDW 21.8*    Chemistries  Recent Labs  Lab 03/03/17 1048  NA 133*  K 3.2*  CL 92*  CO2 33*  GLUCOSE 135*  BUN 8  CREATININE 0.96  CALCIUM 8.9   ------------------------------------------------------------------------------------------------------------------ estimated creatinine clearance is 83 mL/min (by C-G formula based on SCr of 0.96 mg/dL). ------------------------------------------------------------------------------------------------------------------ No results for input(s): HGBA1C in the last 72 hours. ------------------------------------------------------------------------------------------------------------------ No results for input(s): CHOL, HDL, LDLCALC, TRIG, CHOLHDL, LDLDIRECT in the last 72 hours. ------------------------------------------------------------------------------------------------------------------ No results for input(s): TSH, T4TOTAL, T3FREE, THYROIDAB in the last 72 hours.  Invalid input(s): FREET3 ------------------------------------------------------------------------------------------------------------------ No results for input(s): VITAMINB12, FOLATE, FERRITIN, TIBC, IRON, RETICCTPCT in the last 72 hours.  Coagulation profile No results for input(s): INR, PROTIME in the last 168 hours.  No results for input(s): DDIMER in the last 72 hours.  Cardiac Enzymes Recent Labs  Lab 03/03/17 1048 03/03/17 1911 03/04/17 0040  TROPONINI 0.03* <0.03 <0.03   ------------------------------------------------------------------------------------------------------------------ Invalid input(s): POCBNP    Assessment & Plan  Patient 77 year old admitted with chest pain 1.  Chest pain with abnormal stress test with large inferior defect  cardiac cath Monday Continue therapy with aspirin and Plavix  2.  Acute bronchospasm with acute bronchitis continue nebs Continue his  inhalers Oral antibiotics  3.  Essential hypertension blood pressure stable continue home medication  4.  Tobacco use abuse smoking cessation provided I strongly recommended he stop smoking 4 min spent nicotine patch will be started  5.  Miscellaneous Lovenox for DVT prophylaxis     Code Status Orders  (From admission, onward)        Start     Ordered   03/03/17 1441  Full code  Continuous     03/03/17 1441    Code Status History    Date Active Date Inactive Code Status Order ID Comments User Context   02/10/2016 12:27 02/11/2016 21:38 Full Code [id number]  Annice Needy, MD Inpatient   01/18/2016 11:20 01/18/2016 16:16 Full Code [id number]  Annice Needy, MD Inpatient   12/08/2014 10:02 12/09/2014 20:53 Full Code [id number]  Iran Ouch, MD Inpatient   12/07/2014 20:22 12/08/2014 10:02 Full Code [id number]  Adrian Saran, MD Inpatient           Consults cardiology  DVT Prophylaxis  Lovenox   Lab Results  Component Value Date   PLT 261 03/03/2017     Time Spent in minutes 35 minutes  Greater than 50% of time spent in care coordination and counseling patient regarding the condition and plan of care.   Auburn Bilberry M.D on 03/05/2017 at 4:36 PM  Between 7am to 6pm - Pager - [phone]  After 6pm go to [email or web address] - password [email or web address] [facility]  [facility]   Office  [phone]

## 2017-03-06 ENCOUNTER — Encounter
Admission: EM | Disposition: A | Discharge status: Home / Self Care | Payer: Self-pay | Source: Home / Self Care | Attending: Emergency Medicine

## 2017-03-06 ENCOUNTER — Encounter: Payer: Self-pay | Admitting: Cardiovascular Disease

## 2017-03-06 DIAGNOSIS — I25110 Atherosclerotic heart disease of native coronary artery with unstable angina pectoris: Secondary | ICD-10-CM | POA: Diagnosis not present

## 2017-03-06 HISTORY — PX: LEFT HEART CATH AND CORONARY ANGIOGRAPHY: CATH118249

## 2017-03-06 LAB — PROTIME-INR
INR: 1.07
PROTHROMBIN TIME: 13.8 s (ref 11.4–15.2)

## 2017-03-06 SURGERY — LEFT HEART CATH AND CORONARY ANGIOGRAPHY
Anesthesia: Moderate Sedation

## 2017-03-06 MED ORDER — HEPARIN SODIUM (PORCINE) 1000 UNIT/ML IJ SOLN
INTRAMUSCULAR | Status: DC | PRN
  Administered 2017-03-06: 5000 [IU] via INTRAVENOUS

## 2017-03-06 MED ORDER — IOPAMIDOL (ISOVUE-300) INJECTION 61%
INTRAVENOUS | Status: DC | PRN
Start: 2017-03-06 — End: 2017-03-06
  Administered 2017-03-06: 100 mL via INTRA_ARTERIAL

## 2017-03-06 MED ORDER — SODIUM CHLORIDE 0.9 % IV SOLN
INTRAVENOUS | Status: AC
  Administered 2017-03-06: 13:00:00 via INTRAVENOUS

## 2017-03-06 MED ORDER — METOPROLOL TARTRATE 5 MG/5ML IV SOLN
INTRAVENOUS | Status: DC | PRN
  Administered 2017-03-06: 2.5 mg via INTRAVENOUS

## 2017-03-06 MED ORDER — SODIUM CHLORIDE 0.9% FLUSH: 3.0000 mL | INTRAVENOUS | Status: DC | PRN

## 2017-03-06 MED ORDER — SODIUM CHLORIDE 0.9 % IV SOLN: 250.0000 mL | INTRAVENOUS | Status: DC | PRN

## 2017-03-06 MED ORDER — MIDAZOLAM HCL 2 MG/2ML IJ SOLN
INTRAMUSCULAR | Status: AC
  Filled 2017-03-06: qty 2

## 2017-03-06 MED ORDER — SODIUM CHLORIDE 0.9 % WEIGHT BASED INFUSION: 1.0000 mL/kg/h | INTRAVENOUS | Status: DC

## 2017-03-06 MED ORDER — LABETALOL HCL 5 MG/ML IV SOLN
INTRAVENOUS | Status: AC
  Filled 2017-03-06: qty 4

## 2017-03-06 MED ORDER — LABETALOL HCL 5 MG/ML IV SOLN
10.0000 mg | Freq: Once | INTRAVENOUS | Status: AC
  Administered 2017-03-06: 10 mg via INTRAVENOUS

## 2017-03-06 MED ORDER — SODIUM CHLORIDE 0.9% FLUSH
3.0000 mL | Freq: Two times a day (BID) | INTRAVENOUS | Status: DC
  Administered 2017-03-06: 3 mL via INTRAVENOUS

## 2017-03-06 MED ORDER — IPRATROPIUM-ALBUTEROL 0.5-2.5 (3) MG/3ML IN SOLN: 3.0000 mL | Freq: Four times a day (QID) | RESPIRATORY_TRACT | Status: AC

## 2017-03-06 MED ORDER — METOPROLOL TARTRATE 50 MG PO TABS
50.0000 mg | ORAL_TABLET | Freq: Two times a day (BID) | ORAL | Status: DC
  Administered 2017-03-06: 50 mg via ORAL
  Filled 2017-03-06: qty 1

## 2017-03-06 MED ORDER — VERAPAMIL HCL 2.5 MG/ML IV SOLN
INTRAVENOUS | Status: AC
  Filled 2017-03-06: qty 2

## 2017-03-06 MED ORDER — FENTANYL CITRATE (PF) 100 MCG/2ML IJ SOLN
INTRAMUSCULAR | Status: DC | PRN
  Administered 2017-03-06: 50 ug via INTRAVENOUS

## 2017-03-06 MED ORDER — FENTANYL CITRATE (PF) 100 MCG/2ML IJ SOLN
INTRAMUSCULAR | Status: AC
  Filled 2017-03-06: qty 2

## 2017-03-06 MED ORDER — MIDAZOLAM HCL 2 MG/2ML IJ SOLN
INTRAMUSCULAR | Status: DC | PRN
  Administered 2017-03-06: 1 mg via INTRAVENOUS

## 2017-03-06 MED ORDER — HEPARIN (PORCINE) IN NACL 2-0.9 UNIT/ML-% IJ SOLN
INTRAMUSCULAR | Status: AC
  Filled 2017-03-06: qty 500

## 2017-03-06 MED ORDER — HEPARIN SODIUM (PORCINE) 1000 UNIT/ML IJ SOLN
INTRAMUSCULAR | Status: AC
  Filled 2017-03-06: qty 1

## 2017-03-06 MED ORDER — PANTOPRAZOLE SODIUM 40 MG PO TBEC: 40.0000 mg | DELAYED_RELEASE_TABLET | Freq: Every day | ORAL | 1 refills | Status: DC

## 2017-03-06 MED ORDER — DOXYCYCLINE HYCLATE 100 MG PO TABS: 100.0000 mg | ORAL_TABLET | Freq: Two times a day (BID) | ORAL | 0 refills | Status: AC

## 2017-03-06 MED ORDER — METOPROLOL TARTRATE 5 MG/5ML IV SOLN
INTRAVENOUS | Status: AC
  Filled 2017-03-06: qty 5

## 2017-03-06 MED ORDER — SODIUM CHLORIDE 0.9 % WEIGHT BASED INFUSION: 3.0000 mL/kg/h | INTRAVENOUS | Status: DC

## 2017-03-06 MED ORDER — PREDNISONE 10 MG (21) PO TBPK: ORAL_TABLET | ORAL | 0 refills | Status: DC

## 2017-03-06 MED ORDER — SODIUM CHLORIDE 0.9% FLUSH: 3.0000 mL | Freq: Two times a day (BID) | INTRAVENOUS | Status: DC

## 2017-03-06 MED ORDER — PANTOPRAZOLE SODIUM 40 MG PO TBEC
40.0000 mg | DELAYED_RELEASE_TABLET | Freq: Every day | ORAL | Status: DC
  Administered 2017-03-06: 40 mg via ORAL
  Filled 2017-03-06: qty 1

## 2017-03-06 SURGICAL SUPPLY — 8 items
CATH 5F 110X4 TIG (CATHETERS) ×2 IMPLANT
CATH INFINITI 5FR ANG PIGTAIL (CATHETERS) ×2 IMPLANT
CATH INFINITI 5FR JL4 (CATHETERS) ×2 IMPLANT
CATH INFINITI JR4 5F (CATHETERS) IMPLANT
GLIDESHEATH SLEND SS 6F .021 (SHEATH) ×2 IMPLANT
KIT MANI 3VAL PERCEP (MISCELLANEOUS) ×2 IMPLANT
PACK CARDIAC CATH (CUSTOM PROCEDURE TRAY) ×2 IMPLANT
WIRE ROSEN-J .035X260CM (WIRE) ×2 IMPLANT

## 2017-03-06 NOTE — Discharge Instructions (Signed)
[facility] - [employer] [facility] ° °DIET:  °Cardiac diet ° °DISCHARGE CONDITION:  °Stable ° °ACTIVITY:  °Activity as tolerated ° °OXYGEN:  °Home Oxygen: No. °  °Oxygen Delivery: room air ° °DISCHARGE LOCATION:  °home  ° ° °ADDITIONAL DISCHARGE INSTRUCTION: ° ° °If you experience worsening of your admission symptoms, develop shortness of breath, life threatening emergency, suicidal or homicidal thoughts you must seek medical attention immediately by calling 911 or calling your MD immediately  if symptoms less severe. ° °You Must read complete instructions/literature along with all the possible adverse reactions/side effects for all the Medicines you take and that have been prescribed to you. Take any new Medicines after you have completely understood and accpet all the possible adverse reactions/side effects.  ° °Please note ° °You were cared for by a hospitalist during your hospital stay. If you have any questions about your discharge medications or the care you received while you were in the hospital after you are discharged, you can call the unit and asked to speak with the hospitalist on call if the hospitalist that took care of you is not available. Once you are discharged, your primary care physician will handle any further medical issues. Please note that NO REFILLS for any discharge medications will be authorized once you are discharged, as it is imperative that you return to your primary care physician (or establish a relationship with a primary care physician if you do not have one) for your aftercare needs so that they can reassess your need for medications and monitor your lab values. ° ° °

## 2017-03-06 NOTE — Progress Notes (Signed)
Patient post heart cath with Dr Kirke CorinArida, vitals stable, TR Band intact with 14ml air post procedure. No bleeding nor hematoma at left radial site. Dr Kirke CorinArida out to speak with patient, given prn meds for HTN. Denies complaints. Left arm elevated to pillow, encouraging patient to keep hand still, who is constantly moving hand and arm, thus taped to pillow per patient request.

## 2017-03-06 NOTE — Interval H&P Note (Signed)
History and Physical Interval Note:  03/06/2017 9:11 AM  Jacob Ford  has presented today for surgery, with the diagnosis of unstable angina, abnormal stress test  The various methods of treatment have been discussed with the patient and family. After consideration of risks, benefits and other options for treatment, the patient has consented to  Procedure(s): LEFT HEART CATH AND CORONARY ANGIOGRAPHY (N/A) as a surgical intervention .  The patient's history has been reviewed, patient examined, no change in status, stable for surgery.  I have reviewed the patient's chart and labs.  Questions were answered to the patient's satisfaction.     Lorine BearsMuhammad Eshan Trupiano

## 2017-03-06 NOTE — Progress Notes (Signed)
All pre-op orders completed. Consent signed and placed on chart. Pt. Has been NPO since midnight.

## 2017-03-06 NOTE — Progress Notes (Signed)
Patient is discharge home in a stable condition, summary and f/u care given, verbalized understanding, site is good no hematoma ,bleeding noted dressing is clean dry and intact .

## 2017-03-06 NOTE — Discharge Summary (Signed)
[facility] [place] [facility], 77 y.o., DOB 12/08/1940, MRN [id number]. Admission date: 03/03/2017 Discharge Date 03/06/2017 Primary MD Derwood Kaplan, MD Admitting Physician Milagros Loll, MD  Admission Diagnosis  Chest pain with high risk for cardiac etiology [R07.9]  Discharge Diagnosis   Active Problems:   Chest pain with high risk for cardiac etiology  acute copd exaceberation Essential hypertenion Tobacco abuse hyperlipdemia        Hospital Course  a 77 y.o. male with a known history of CAD, aortic aneurysm, hypertension, tobacco abuse with non-ST elevation MI in 2016 with PCI presents to the hospital complaining of exertional chest burning for the past few weeks. Patient underwent stress test which was positive therefore underwent CAth.. Cath showed following:   Dist LAD lesion is 80% stenosed.  Prox LAD-2 lesion is 60% stenosed.  Prox LAD-1 lesion is 50% stenosed.  Ost 3rd Mrg to 3rd Mrg lesion is 50% stenosed.  Prox RCA to Mid RCA lesion is 50% stenosed.  Ost RPDA lesion is 50% stenosed.  Post Atrio lesion is 60% stenosed.  Previously placed Dist RCA drug eluting stent is widely patent.  Balloon angioplasty was performed.  1st RPLB lesion is 70% stenosed.  Mid Cx to Dist Cx lesion is 50% stenosed.  The left ventricular ejection fraction is 50-55% by visual estimate.  LV end diastolic pressure is mildly elevated.  There is mild left ventricular systolic dysfunction.  Is burning in the chest was felt to be not related to his heart.  Patient will be tried on PPI.  He also has shortness of breath and wheezing.  Heavy smoker.  He likely has COPD he was treated for COPD exasperation.  Patient will need outpatient follow-up with pulmonary and have PFTs done.            Consults  cardiology  Significant Tests:  See full reports for all details    Dg Chest 2 View  Result Date: 03/03/2017 CLINICAL DATA:  Chest  pain and fatigue for 4 days. Cough. Shortness of breath. Smoker. EXAM: CHEST  2 VIEW COMPARISON:  12/05/2014 chest radiographs and CT FINDINGS: The cardiac silhouette is slightly enlarged. Aortic atherosclerosis is noted. Mild prominence of the central pulmonary arteries is unchanged. There is slight pleural thickening along the right minor fissure. No confluent airspace opacity, overt pulmonary edema, pleural effusion, or pneumothorax is identified. No acute osseous abnormality is seen. IMPRESSION: No active cardiopulmonary disease. Electronically Signed   By: Sebastian Ache M.D.   On: 03/03/2017 11:08   Nm Myocar Multi W/spect W/wall Motion / Ef  Result Date: 03/04/2017 Pharmacological myocardial perfusion imaging study with ischemia noted in the mid to apical inferior and inferolateral wall There is a small region of mild fixed defect on resting attenuation corrected images in the periapical inferolateral wall. Non-attenuation corrected images with large predominantly fixed inferior and inferolateral wall defect, mild ischemia noted. Inferior wall hypokinesis noted, EF estimated at 56% No EKG changes concerning for ischemia at peak stress or in recovery. High risk scan Compared to previous study dated 01/2015, there is now large perfusion defect inferior and inferolateral region. Prior cardiac cath 12/2014 Signed, Dossie Arbour, MD, Ph.D [facility]       Today   Subjective:   Jacob Ford feeling much better denies any chest pain still having some wheezing Objective:   Blood pressure (!) 158/85, pulse 91, temperature 97.9 F (36.6 C), temperature source Oral, resp. rate (!) 21,  height 6\' 2"  (1.88 m), weight 227 lb (103 kg), SpO2 94 %.  .  Intake/Output Summary (Last 24 hours) at 03/06/2017 1539 Last data filed at 03/06/2017 1500 Gross per 24 hour  Intake 898.61 ml  Output 1300 ml  Net -401.39 ml    Exam VITAL SIGNS: Blood pressure (!) 158/85, pulse 91, temperature 97.9 F (36.6 C),  temperature source Oral, resp. rate (!) 21, height 6\' 2"  (1.88 m), weight 227 lb (103 kg), SpO2 94 %.  GENERAL:  77 y.o.-year-old patient lying in the bed with no acute distress.  EYES: Pupils equal, round, reactive to light and accommodation. No scleral icterus. Extraocular muscles intact.  HEENT: Head atraumatic, normocephalic. Oropharynx and nasopharynx clear.  NECK:  Supple, no jugular venous distention. No thyroid enlargement, no tenderness.  LUNGS: Bilateral wheezing throughout both lungs cARDIOVASCULAR: S1, S2 normal. No murmurs, rubs, or gallops.  ABDOMEN: Soft, nontender, nondistended. Bowel sounds present. No organomegaly or mass.  EXTREMITIES: No pedal edema, cyanosis, or clubbing.  NEUROLOGIC: Cranial nerves II through XII are intact. Muscle strength 5/5 in all extremities. Sensation intact. Gait not checked.  PSYCHIATRIC: The patient is alert and oriented x 3.  SKIN: No obvious rash, lesion, or ulcer.   Data Review     CBC w Diff:  Lab Results  Component Value Date   WBC 8.2 03/03/2017   HGB 10.5 (L) 03/03/2017   HGB 14.2 12/14/2012   HCT 37.2 (L) 03/03/2017   HCT 43.5 12/14/2012   PLT 261 03/03/2017   PLT 178 12/14/2012   LYMPHOPCT 12 01/20/2016   LYMPHOPCT 29.7 12/14/2012   MONOPCT 11 01/20/2016   MONOPCT 10.2 12/14/2012   EOSPCT 0 01/20/2016   EOSPCT 1.2 12/14/2012   BASOPCT 1 01/20/2016   BASOPCT 1.2 12/14/2012   CMP:  Lab Results  Component Value Date   NA 133 (L) 03/03/2017   NA 138 12/14/2012   K 3.2 (L) 03/03/2017   K 3.6 12/14/2012   CL 92 (L) 03/03/2017   CL 101 12/14/2012   CO2 33 (H) 03/03/2017   CO2 28 12/14/2012   BUN 8 03/03/2017   BUN 14 12/14/2012   CREATININE 0.96 03/03/2017   CREATININE 0.92 12/14/2012   PROT 7.9 01/20/2016   PROT 7.7 12/13/2012   ALBUMIN 3.4 (L) 01/20/2016   ALBUMIN 3.3 (L) 12/13/2012   BILITOT 0.5 01/20/2016   BILITOT 0.2 12/13/2012   ALKPHOS 45 01/20/2016   ALKPHOS 47 12/13/2012   AST 21 01/20/2016   AST  19 12/13/2012   ALT 8 (L) 01/20/2016   ALT 19 12/13/2012  .  Micro Results No results found for this or any previous visit (from the past 240 hour(s)).      Code Status Orders  (From admission, onward)        Start     Ordered   03/03/17 1441  Full code  Continuous     03/03/17 1441    Code Status History    Date Active Date Inactive Code Status Order ID Comments User Context   02/10/2016 12:27 02/11/2016 21:38 Full Code [id number]  Annice Needy, MD Inpatient   01/18/2016 11:20 01/18/2016 16:16 Full Code [id number]  Annice Needy, MD Inpatient   12/08/2014 10:02 12/09/2014 20:53 Full Code [id number]  Iran Ouch, MD Inpatient   12/07/2014 20:22 12/08/2014 10:02 Full Code [id number]  Adrian Saran, MD Inpatient          Follow-up Information    Derwood Kaplan, MD Follow  up in 1 week(s).   Specialty:  Internal Medicine Contact information: [address] [place] [address] [phone]        Shane Crutchamachandran, Pradeep, MD Follow up in 2 week(s).   Specialty:  Pulmonary Disease Why:  eval copd Contact information: [address] [place] [address] [phone]        Iran OuchArida, Muhammad A, MD Follow up in 2 week(s).   Specialty:  Cardiology Contact information: [address] [place] [address] [phone]           Discharge Medications   Allergies as of 03/06/2017   No Known Allergies     Medication List    TAKE these medications   aspirin 81 MG EC tablet Take 1 tablet (81 mg total) by mouth daily.   atorvastatin 80 MG tablet Commonly known as:  LIPITOR Take 1 tablet (80 mg total) by mouth daily at 6 PM.   BREO ELLIPTA 100-25 MCG/INH Aepb Generic drug:  fluticasone furoate-vilanterol Inhale 1 puff into the lungs daily.   clopidogrel 75 MG tablet Commonly known as:  PLAVIX TAKE ONE TABLET BY MOUTH ONCE DAILY   doxycycline 100 MG tablet Commonly known as:  VIBRA-TABS Take 1 tablet (100 mg total) by mouth  every 12 (twelve) hours for 4 days.   ipratropium-albuterol 0.5-2.5 (3) MG/3ML Soln Commonly known as:  DUONEB Take 3 mLs by nebulization every 6 (six) hours.   lisinopril-hydrochlorothiazide 20-25 MG tablet Commonly known as:  PRINZIDE,ZESTORETIC Take 1 tablet by mouth daily.   metoprolol tartrate 50 MG tablet Commonly known as:  LOPRESSOR Take 50 mg by mouth 2 (two) times daily.   montelukast 10 MG tablet Commonly known as:  SINGULAIR Take 10 mg by mouth at bedtime.   nitroGLYCERIN 0.4 MG SL tablet Commonly known as:  NITROSTAT Place 1 tablet (0.4 mg total) under the tongue every 5 (five) minutes as needed for chest pain.   pantoprazole 40 MG tablet Commonly known as:  PROTONIX Take 1 tablet (40 mg total) by mouth daily.   predniSONE 10 MG (21) Tbpk tablet Commonly known as:  STERAPRED UNI-PAK 21 TAB Start at 60mg  taper by 10mg  until complete   varenicline 0.5 MG X 11 & 1 MG X 42 tablet Commonly known as:  CHANTIX STARTING MONTH PAK Take one 0.5 mg tab once daily for 3 days, then increase to one 0.5 mg tab twice daily for 4 days, then increase to one 1 mg tab twice daily.   varenicline 1 MG tablet Commonly known as:  CHANTIX CONTINUING MONTH PAK Take 1 tablet (1 mg total) by mouth 2 (two) times daily.          Total Time in preparing paper work, data evaluation and todays exam - 35 minutes  Auburn BilberryShreyang Veto Macqueen M.D on 03/06/2017 at 3:39 PM  [facility]   Office  [phone]

## 2017-03-06 NOTE — Progress Notes (Signed)
Patient remains clinically stable with bp improving with meds.no bleeding nor hematoma at left radial site. Son at bedside. Sinus per monitor. Report called to Abagail Rn on telemetry with questions answered.

## 2017-03-14 ENCOUNTER — Emergency Department
Admission: EM | Admit: 2017-03-14 | Discharge: 2017-03-14 | Disposition: A | Discharge status: Home / Self Care | Payer: Medicare Other | Attending: Emergency Medicine | Admitting: Emergency Medicine

## 2017-03-14 ENCOUNTER — Other Ambulatory Visit: Payer: Self-pay

## 2017-03-14 DIAGNOSIS — J449 Chronic obstructive pulmonary disease, unspecified: Secondary | ICD-10-CM | POA: Diagnosis not present

## 2017-03-14 DIAGNOSIS — Z7982 Long term (current) use of aspirin: Secondary | ICD-10-CM | POA: Insufficient documentation

## 2017-03-14 DIAGNOSIS — R339 Retention of urine, unspecified: Secondary | ICD-10-CM | POA: Insufficient documentation

## 2017-03-14 DIAGNOSIS — F17210 Nicotine dependence, cigarettes, uncomplicated: Secondary | ICD-10-CM | POA: Insufficient documentation

## 2017-03-14 DIAGNOSIS — I119 Hypertensive heart disease without heart failure: Secondary | ICD-10-CM | POA: Diagnosis not present

## 2017-03-14 DIAGNOSIS — Z7902 Long term (current) use of antithrombotics/antiplatelets: Secondary | ICD-10-CM | POA: Diagnosis not present

## 2017-03-14 DIAGNOSIS — I2510 Atherosclerotic heart disease of native coronary artery without angina pectoris: Secondary | ICD-10-CM | POA: Insufficient documentation

## 2017-03-14 DIAGNOSIS — Z955 Presence of coronary angioplasty implant and graft: Secondary | ICD-10-CM | POA: Diagnosis not present

## 2017-03-14 DIAGNOSIS — J45909 Unspecified asthma, uncomplicated: Secondary | ICD-10-CM | POA: Insufficient documentation

## 2017-03-14 LAB — BASIC METABOLIC PANEL
ANION GAP: 10 (ref 5–15)
BUN: 14 mg/dL (ref 6–20)
CALCIUM: 8.7 mg/dL — AB (ref 8.9–10.3)
CO2: 32 mmol/L (ref 22–32)
Chloride: 92 mmol/L — ABNORMAL LOW (ref 101–111)
Creatinine, Ser: 0.8 mg/dL (ref 0.61–1.24)
GLUCOSE: 150 mg/dL — AB (ref 65–99)
Potassium: 3.1 mmol/L — ABNORMAL LOW (ref 3.5–5.1)
SODIUM: 134 mmol/L — AB (ref 135–145)

## 2017-03-14 LAB — URINALYSIS, COMPLETE (UACMP) WITH MICROSCOPIC
BACTERIA UA: NONE SEEN
Bilirubin Urine: NEGATIVE
GLUCOSE, UA: NEGATIVE mg/dL
Hgb urine dipstick: NEGATIVE
Ketones, ur: NEGATIVE mg/dL
Leukocytes, UA: NEGATIVE
Nitrite: NEGATIVE
PROTEIN: NEGATIVE mg/dL
RBC / HPF: NONE SEEN RBC/hpf (ref 0–5)
Specific Gravity, Urine: 1.011 (ref 1.005–1.030)
pH: 6 (ref 5.0–8.0)

## 2017-03-14 MED ORDER — TAMSULOSIN HCL 0.4 MG PO CAPS: 0.4000 mg | ORAL_CAPSULE | Freq: Every day | ORAL | 1 refills | Status: AC

## 2017-03-14 NOTE — ED Notes (Signed)
Called in waiting room.  No answer. 

## 2017-03-14 NOTE — ED Notes (Addendum)
PVR of .

## 2017-03-14 NOTE — Discharge Instructions (Signed)
please try the Flomax one pill a day. That may help you urinate better. It may take a day or 2 to start working. Please call Dr. Apolinar JunesBrandon the urologist. set up an appointment with her. She can evaluate she further. If your urine stops coming out please return here rapidly. That can be very painful.

## 2017-03-14 NOTE — ED Triage Notes (Signed)
Pt c/o painful urination for the past 2 days, states he is having to strain to urinate..Jacob Ford

## 2017-03-14 NOTE — ED Notes (Signed)
Called in waiting room.  No Answer. 

## 2017-03-14 NOTE — ED Notes (Signed)
Pt stating that he is having issues with urination. Pt stating pain with urination along with an inconsistent stream.Pt denying any blood in urine or fevers. Pt stating this has been going on for a couple days. Pt stating pressure when he urinates.

## 2017-03-14 NOTE — ED Provider Notes (Signed)
[facility] Emergency Department Provider Note   ____________________________________________   First MD Initiated Contact with Patient 03/14/17 1349     (approximate)  I have reviewed the triage vital signs and the nursing notes.   HISTORY  Chief Complaint Urinary Frequency   HPI Jacob Ford is a 77 y.o. male Patient reports he was discharged from the hospital Sunday. He was in for evaluation of his heart. He reports that yesterday and today he began having difficulty passing his water had to strain. He had a very weak stream and intermittent stream and some discomfort and felt like he wasn't emptying completely. This continued today.nothing he did made it better or worse.   Past Medical History:  Diagnosis Date  . Asthma   . CAD (coronary artery disease)    a. 2002 Cath: Nonobs dzs per patient;  b. 11/2014 NSTEMI/PCI: LM nl, LAD 30p, 60p, 80d, D1/2/3 min irregs, RI min irregs, LCX, 89m, OM1 min irregs, OM2 nl, OM3 50ost, RCA 40p, 100d (2.25x18 Xience Alpine DES), EF 55-65%; c. 01/2015 MV: nl EF, no ischemia.  [name] COPD (chronic obstructive pulmonary disease) (HCC)   . Cough    SMOKER. DENIES FEVER/WHEEZING/ COUGH NONPRODUCTIVE  . Dyspnea    chronic  . Hyperlipidemia   . Hypertension   . Hypertensive heart disease   . Myocardial infarction (HCC)    2016  . Peripheral vascular disease (HCC)   . Tobacco abuse   . Tobacco abuse     Patient Active Problem List   Diagnosis Date Noted  . Chest pain with high risk for cardiac etiology 03/03/2017  . Back pain 06/03/2016  . Essential hypertension, benign 12/25/2015  . AAA (abdominal aortic aneurysm) without rupture (HCC) 12/25/2015  . Tobacco abuse   . Hyperlipidemia   . CAD (coronary artery disease)   . Hypertensive heart disease   . COPD (chronic obstructive pulmonary disease) (HCC)   . Angina pectoris (HCC)   . Coronary artery disease involving native coronary artery of native heart with angina  pectoris with documented spasm (HCC)   . Smoker   . NSTEMI (non-ST elevated myocardial infarction) (HCC) 12/07/2014    Past Surgical History:  Procedure Laterality Date  . CARDIAC CATHETERIZATION    . CARDIAC CATHETERIZATION Bilateral 12/08/2014   Procedure: Left Heart Cath and Coronary Angiography;  Surgeon: Iran Ouch, MD;  Location: [facility] INVASIVE CV LAB;  Service: Cardiovascular;  Laterality: Bilateral;  . CARDIAC CATHETERIZATION N/A 12/08/2014   Procedure: Coronary Stent Intervention;  Surgeon: Iran Ouch, MD;  Location: [facility] INVASIVE CV LAB;  Service: Cardiovascular;  Laterality: N/A;  . CORONARY ANGIOPLASTY     stent 2016  . ENDOVASCULAR REPAIR/STENT GRAFT N/A 02/10/2016   Procedure: Endovascular Repair/Stent Graft;  Surgeon: Annice Needy, MD;  Location: [facility] INVASIVE CV LAB;  Service: Cardiovascular;  Laterality: N/A;  . LEFT HEART CATH AND CORONARY ANGIOGRAPHY N/A 03/06/2017   Procedure: LEFT HEART CATH AND CORONARY ANGIOGRAPHY;  Surgeon: Iran Ouch, MD;  Location: [facility] INVASIVE CV LAB;  Service: Cardiovascular;  Laterality: N/A;  . PERIPHERAL VASCULAR CATHETERIZATION Right 01/18/2016   Procedure: Embolization;  Surgeon: Annice Needy, MD;  Location: [facility] INVASIVE CV LAB;  Service: Cardiovascular;  Laterality: Right;  . SHOULDER SURGERY Left     Prior to Admission medications   Medication Sig Start Date End Date Taking? Authorizing Provider  aspirin EC 81 MG EC tablet Take 1 tablet (81 mg total) by mouth daily. 12/09/14  Enedina Finner, MD  atorvastatin (LIPITOR) 80 MG tablet Take 1 tablet (80 mg total) by mouth daily at 6 PM. Patient not taking: Reported on 03/03/2017 12/09/14   Enedina Finner, MD  clopidogrel (PLAVIX) 75 MG tablet TAKE ONE TABLET BY MOUTH ONCE DAILY 08/09/16   Iran Ouch, MD  fluticasone furoate-vilanterol (BREO ELLIPTA) 100-25 MCG/INH AEPB Inhale 1 puff into the lungs daily.    [provider]  ipratropium-albuterol (DUONEB) 0.5-2.5 (3)  MG/3ML SOLN Take 3 mLs by nebulization every 6 (six) hours. 03/06/17   Auburn Bilberry, MD  lisinopril-hydrochlorothiazide (PRINZIDE,ZESTORETIC) 20-25 MG tablet Take 1 tablet by mouth daily.    [provider]  metoprolol tartrate (LOPRESSOR) 50 MG tablet Take 50 mg by mouth 2 (two) times daily.  04/04/16   [provider]  montelukast (SINGULAIR) 10 MG tablet Take 10 mg by mouth at bedtime.    [provider]  nitroGLYCERIN (NITROSTAT) 0.4 MG SL tablet Place 1 tablet (0.4 mg total) under the tongue every 5 (five) minutes as needed for chest pain. 12/09/14   Antonieta Iba, MD  pantoprazole (PROTONIX) 40 MG tablet Take 1 tablet (40 mg total) by mouth daily. 03/06/17 03/06/18  Auburn Bilberry, MD  predniSONE (STERAPRED UNI-PAK 21 TAB) 10 MG (21) TBPK tablet Start at 60mg  taper by 10mg  until complete 03/06/17   Auburn Bilberry, MD  tamsulosin (FLOMAX) 0.4 MG CAPS capsule Take 1 capsule (0.4 mg total) by mouth daily. 03/14/17   Arnaldo Natal, MD  varenicline (CHANTIX CONTINUING MONTH PAK) 1 MG tablet Take 1 tablet (1 mg total) by mouth 2 (two) times daily. Patient not taking: Reported on 08/08/2016 07/13/16   Iran Ouch, MD  varenicline (CHANTIX STARTING MONTH PAK) 0.5 MG X 11 & 1 MG X 42 tablet Take one 0.5 mg tab once daily for 3 days, then increase to one 0.5 mg tab twice daily for 4 days, then increase to one 1 mg tab twice daily. Patient not taking: Reported on 08/08/2016 06/13/16   Iran Ouch, MD    Allergies Patient has no known allergies.  Family History  Problem Relation Age of Onset  . Hypertension Mother   . Hypertension Father   . CAD Brother   . CAD Brother   . Stroke Sister   . Stroke Sister     Social History Social History   Tobacco Use  . Smoking status: Current Every Day Smoker    Packs/day: 1.00    Types: Cigarettes  . Smokeless tobacco: Never Used  Substance Use Topics  . Alcohol use: No  . Drug use: No    Review of  Systems  Constitutional: No fever/chills Eyes: No visual changes. ENT: No sore throat. Cardiovascular: Denies chest pain. Respiratory: Denies shortness of breath. Gastrointestinal: No abdominal pain.  No nausea, no vomiting.  No diarrhea.  No constipation. Genitourinary: see history of present illness Musculoskeletal: Negative for back pain. Skin: Negative for rash. Neurological: Negative for headaches, focal weakness  ____________________________________________   PHYSICAL EXAM:  VITAL SIGNS: ED Triage Vitals  Enc Vitals Group     BP 03/14/17 1219 124/64     Pulse Rate 03/14/17 1219 89     Resp 03/14/17 1219 18     Temp 03/14/17 1219 98.8 F (37.1 C)     Temp Source 03/14/17 1219 Oral     SpO2 03/14/17 1219 94 %     Weight 03/14/17 1221 227 lb (103 kg)     Height 03/14/17  1221 6\' 2"  (1.88 m)     Head Circumference --      Peak Flow --      Pain Score 03/14/17 1221 0     Pain Loc --      Pain Edu? --      Excl. in GC? --     Constitutional: Alert and oriented. Well appearing and in no acute distress. Eyes: Conjunctivae are normal.  Head: Atraumatic. Nose: No congestion/rhinnorhea. Mouth/Throat: Mucous membranes are moist. . Neck: No stridor. Cardiovascular:  Good peripheral circulation. Respiratory: Normal respiratory effort.  No retractions.  Gastrointestinal: Soft and nontender. No distention. No abdominal bruits.  rectal: Prostate nontender does not feel enlarged. Neurologic:  Normal speech and language. No gross focal neurologic deficits are appreciated. No gait instability. Skin:  Skin is warm, dry and intact. No rash noted. Psychiatric: Mood and affect are normal. Speech and behavior are normal.  ____________________________________________   LABS (all labs ordered are listed, but only abnormal results are displayed)  Labs Reviewed  URINALYSIS, COMPLETE (UACMP) WITH MICROSCOPIC - Abnormal; Notable for the following components:      Result Value    Color, Urine YELLOW (*)    APPearance CLEAR (*)    Squamous Epithelial / LPF 0-5 (*)    All other components within normal limits  BASIC METABOLIC PANEL   ____________________________________________  EKG   ____________________________________________  RADIOLOGY  ED MD interpretation:    Official radiology report(s): No results found.  ____________________________________________   PROCEDURES  Procedure(s) performed:   Procedures  Critical Care performed:   ____________________________________________   INITIAL IMPRESSION / ASSESSMENT AND PLAN / ED COURSE  Patient's symptoms sound like they are related to benign prostatic hypertrophy. We'll try some Flomax and see if that helps and have him follow-up with urology.      ____________________________________________   FINAL CLINICAL IMPRESSION(S) / ED DIAGNOSES  Final diagnoses:  Urinary retention     ED Discharge Orders        Ordered    tamsulosin (FLOMAX) 0.4 MG CAPS capsule  Daily     03/14/17 1439       Note:  This document was prepared using Dragon voice recognition software and may include unintentional dictation errors.    Arnaldo NatalMalinda, Arelia Volpe F, MD 03/14/17 1444

## 2017-03-15 ENCOUNTER — Other Ambulatory Visit: Payer: Self-pay | Admitting: Family Medicine

## 2017-03-15 DIAGNOSIS — R0602 Shortness of breath: Secondary | ICD-10-CM

## 2017-03-15 DIAGNOSIS — F17210 Nicotine dependence, cigarettes, uncomplicated: Secondary | ICD-10-CM

## 2017-03-21 ENCOUNTER — Ambulatory Visit (INDEPENDENT_AMBULATORY_CARE_PROVIDER_SITE_OTHER): Payer: Medicare Other | Admitting: Cardiovascular Disease

## 2017-03-21 ENCOUNTER — Encounter: Payer: Self-pay | Admitting: Cardiovascular Disease

## 2017-03-21 ENCOUNTER — Ambulatory Visit: Payer: Medicare Other | Admitting: Cardiovascular Disease

## 2017-03-21 ENCOUNTER — Encounter: Payer: Self-pay | Admitting: Internal Medicine

## 2017-03-21 ENCOUNTER — Ambulatory Visit (INDEPENDENT_AMBULATORY_CARE_PROVIDER_SITE_OTHER): Payer: Medicare Other | Admitting: Internal Medicine

## 2017-03-21 VITALS — BP 100/70 | HR 89 | Ht 74.0 in | Wt 224.5 lb

## 2017-03-21 VITALS — BP 106/60 | HR 85 | Resp 16 | Ht 74.0 in

## 2017-03-21 DIAGNOSIS — J449 Chronic obstructive pulmonary disease, unspecified: Secondary | ICD-10-CM | POA: Diagnosis not present

## 2017-03-21 DIAGNOSIS — F17210 Nicotine dependence, cigarettes, uncomplicated: Secondary | ICD-10-CM | POA: Diagnosis not present

## 2017-03-21 DIAGNOSIS — I25118 Atherosclerotic heart disease of native coronary artery with other forms of angina pectoris: Secondary | ICD-10-CM

## 2017-03-21 DIAGNOSIS — Z720 Tobacco use: Secondary | ICD-10-CM | POA: Diagnosis not present

## 2017-03-21 DIAGNOSIS — E785 Hyperlipidemia, unspecified: Secondary | ICD-10-CM | POA: Diagnosis not present

## 2017-03-21 DIAGNOSIS — I10 Essential (primary) hypertension: Secondary | ICD-10-CM | POA: Diagnosis not present

## 2017-03-21 MED ORDER — FLUTICASONE-SALMETEROL 250-50 MCG/DOSE IN AEPB: 1.0000 | INHALATION_SPRAY | Freq: Two times a day (BID) | RESPIRATORY_TRACT | 11 refills | Status: DC

## 2017-03-21 MED ORDER — LISINOPRIL-HYDROCHLOROTHIAZIDE 10-12.5 MG PO TABS: 1.0000 | ORAL_TABLET | Freq: Every day | ORAL | 1 refills | Status: DC

## 2017-03-21 NOTE — Patient Instructions (Signed)
Medication Instructions:  Your physician has recommended you make the following change in your medication:  DECREASE lisinopril -HCTZ to 10-12.5mg  once daily   Labwork: none  Testing/Procedures: none  Follow-Up: Your physician recommends that you schedule a follow-up appointment in: 3 months with Dr. Kirke CorinArida.    Any Other Special Instructions Will Be Listed Below (If Applicable).     If you need a refill on your cardiac medications before your next appointment, please call your pharmacy.

## 2017-03-21 NOTE — Progress Notes (Signed)
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 Recent hospitalization due to COPD exacerbation, COPD group D. -She is patient noted improvement with Brio, could not afford it, will try Advair he may be able to afford more easily. -In future visits we will continue to discuss smoking cessation, as well as consideration for referral to pulmonary rehab, CT lung cancer screening, Pneumovax.  Would not consider these at this time yet, as the patient still appears to be recovering from his recent hospitalization with some scattered wheezing.  Nicotine abuse. -Continued nicotine abuse, not think that he can quit yet at this time.  Discussed the importance of smoke cessation, he remains at high risk of exacerbations primarily because of continued smoking. -Spent greater than 3 minutes in discussion.  Meds ordered this encounter  Medications  . Fluticasone-Salmeterol (ADVAIR DISKUS) 250-50 MCG/DOSE AEPB    Sig: Inhale 1 puff into the lungs 2 (two) times daily. Rinse mouth after use.    Dispense:  1 each    Refill:  11   Return in about 3 months (around 06/21/2017).   Date: 03/21/2017  MRN# [id number] Jacob Ford [birth year]  Referring Physician: Hospitalist physician.   Jacob Ford is a 77 y.o. old male seen in consultation for chief complaint of:    Chief Complaint  Patient presents with  . Hospitalization Follow-up    Pt here for f/u admitted 2-22 thru 2-25  . Shortness of Breath    with and without exertion  . Wheezing  . Cough    mostly non productive    HPI:   Patient is a 77 year old male with history of COPD, coronary artery disease, nicotine abuse, discharged from the hospital and February of this year for COPD exacerbation.He notes that his breathing is worse with walking the driveway, taking a shower, getting dressed. He can walk a [employer] but has to sit down before walking back out.  He  was taking Breo, but it was too expensive, he is now doing a nebulizer 4 or 5 times per day. He is smoking about a ppd, he has tried quitting but has not been successful. He has not been on other inhalers. He recently in the hospital for prostatitis, get started flomax and is now resolved.   Cardiac catheterization 03/06/17; 1.  Significant underlying two-vessel coronary artery disease with patent stent in the distal right coronary artery without significant restenosis.  Known moderate proximal to mid LAD disease and significant distal LAD stenosis both unchanged from before. 2. Low normal LV systolic function with an EF of 50-55%.  Mildly to moderately reduced LV EDP at 18-20 mmHg.   PMHX:   Past Medical History:  Diagnosis Date  . Asthma   . CAD (coronary artery disease)    a. 2002 Cath: Nonobs dzs per patient;  b. 11/2014 NSTEMI/PCI: LM nl, LAD 30p, 60p, 80d, D1/2/3 min irregs, RI min irregs, LCX, 28m, OM1 min irregs, OM2 nl, OM3 50ost, RCA 40p, 100d (2.25x18 Xience Alpine DES), EF 55-65%; c. 01/2015 MV: nl EF, no ischemia.  [name] COPD (chronic obstructive pulmonary disease) (HCC)   . Cough    SMOKER. DENIES FEVER/WHEEZING/ COUGH NONPRODUCTIVE  . Dyspnea    chronic  . Hyperlipidemia   . Hypertension   . Hypertensive heart disease   . Myocardial infarction (HCC)    2016  . Peripheral vascular disease (HCC)   . Tobacco abuse   . Tobacco  abuse    Surgical Hx:  Past Surgical History:  Procedure Laterality Date  . CARDIAC CATHETERIZATION    . CARDIAC CATHETERIZATION Bilateral 12/08/2014   Procedure: Left Heart Cath and Coronary Angiography;  Surgeon: Iran Ouch, MD;  Location: [facility] INVASIVE CV [facility];  Service: Cardiovascular;  Laterality: Bilateral;  . CARDIAC CATHETERIZATION N/A 12/08/2014   Procedure: Coronary Stent Intervention;  Surgeon: Iran Ouch, MD;  Location: [facility] IN[facility];  Service: Cardiovascular;  Laterality: N/A;  . CORONARY ANGIOPLASTY     stent 2016  .  ENDOVASCULAR REPAIR/STENT GRAFT N/A 02/10/2016   Procedure: Endovascular Repair/Stent Graft;  Surgeon: Annice Needy, MD;  Location: [facility];  Service: Cardiovascular;  Laterality: N/A;  . LEFT HEART CATH AND CORONARY ANGIOGRAPHY N/A 03/06/2017   Procedure: LEFT HEART CATH AND CORONARY ANGIOGRAPHY;  Surgeon: Iran Ouch, MD;  Location: [facility];  Service: Cardiovascular;  Laterality: N/A;  . PERIPHERAL VASCULAR CATHETERIZATION Right 01/18/2016   Procedure: Embolization;  Surgeon: Annice Needy, MD;  Location: [facility];  Service: Cardiovascular;  Laterality: Right;  . SHOULDER SURGERY Left    Family Hx:  Family History  Problem Relation Age of Onset  . Hypertension Mother   . Hypertension Father   . CAD Brother   . CAD Brother   . Stroke Sister   . Stroke Sister    Social Hx:   Social History   Tobacco Use  . Smoking status: Current Every Day Smoker    Packs/day: 1.00    Types: Cigarettes  . Smokeless tobacco: Never Used  Substance Use Topics  . Alcohol use: No  . Drug use: No   Medication:    Current Outpatient Medications:  .  aspirin EC 81 MG EC tablet, Take 1 tablet (81 mg total) by mouth daily., Disp: 30 tablet, Rfl: 2 .  atorvastatin (LIPITOR) 80 MG tablet, Take 1 tablet (80 mg total) by mouth daily at 6 PM., Disp: 30 tablet, Rfl: 2 .  clopidogrel (PLAVIX) 75 MG tablet, TAKE ONE TABLET BY MOUTH ONCE DAILY, Disp: 90 tablet, Rfl: 3 .  ipratropium-albuterol (DUONEB) 0.5-2.5 (3) MG/3ML SOLN, Take 3 mLs by nebulization every 6 (six) hours., Disp: 360 mL, Rfl:  .  lisinopril-hydrochlorothiazide (PRINZIDE,ZESTORETIC) 20-25 MG tablet, Take 1 tablet by mouth daily., Disp: , Rfl:  .  metoprolol tartrate (LOPRESSOR) 50 MG tablet, Take 50 mg by mouth 2 (two) times daily. , Disp: , Rfl:  .  montelukast (SINGULAIR) 10 MG tablet, Take 10 mg by mouth at bedtime., Disp: , Rfl:  .  nitroGLYCERIN (NITROSTAT) 0.4 MG SL tablet, Place 1 tablet (0.4 mg total)  under the tongue every 5 (five) minutes as needed for chest pain., Disp: 25 tablet, Rfl: 3 .  pantoprazole (PROTONIX) 40 MG tablet, Take 1 tablet (40 mg total) by mouth daily., Disp: 30 tablet, Rfl: 1 .  tamsulosin (FLOMAX) 0.4 MG CAPS capsule, Take 1 capsule (0.4 mg total) by mouth daily., Disp: 30 capsule, Rfl: 1 .  varenicline (CHANTIX CONTINUING MONTH PAK) 1 MG tablet, Take 1 tablet (1 mg total) by mouth 2 (two) times daily. (Patient not taking: Reported on 03/21/2017), Disp: 60 tablet, Rfl: 1 .  varenicline (CHANTIX STARTING MONTH PAK) 0.5 MG X 11 & 1 MG X 42 tablet, Take one 0.5 mg tab once daily for 3 days, then increase to one 0.5 mg tab twice daily for 4 days, then increase to one 1 mg tab twice daily. (  Patient not taking: Reported on 03/21/2017), Disp: 53 tablet, Rfl: 0   Allergies:  Patient has no known allergies.  Review of Systems: Gen:  Denies  fever, sweats, chills HEENT: Denies blurred vision, double vision. bleeds, sore throat Cvc:  No dizziness, chest pain. Resp:   Denies cough or sputum production, shortness of breath Gi: Denies swallowing difficulty, stomach pain. Gu:  Denies bladder incontinence, burning urine Ext:   No Joint pain, stiffness. Skin: No skin rash,  hives  Endoc:  No polyuria, polydipsia. Psych: No depression, insomnia. Other:  All other systems were reviewed with the patient and were negative other that what is mentioned in the HPI.   Physical Examination:   VS: BP 106/60 (BP Location: Left Arm, Cuff Size: Normal)   Pulse 85   Resp 16   Ht 6\' 2"  (1.88 m)   SpO2 97%   BMI 29.15 kg/m   General Appearance: No distress  Neuro:without focal findings,  speech normal,  HEENT: PERRLA, EOM intact.   Pulmonary: normal breath sounds, scattered bilateral wheezing. CardiovascularNormal S1,S2.  No m/r/g.   Abdomen: Benign, Soft, non-tender. Renal:  No costovertebral tenderness  GU:  No performed at this time. Endoc: No evident thyromegaly, no signs of  acromegaly. Skin:   warm, no rashes, no ecchymosis  Extremities: normal, no cyanosis, clubbing.  Other findings:    LABORATORY PANEL:   CBC No results for input(s): WBC, HGB, HCT, PLT in the last 168 hours. ------------------------------------------------------------------------------------------------------------------  Chemistries  Recent Labs  Lab 03/14/17 1350  NA 134*  K 3.1*  CL 92*  CO2 32  GLUCOSE 150*  BUN 14  CREATININE 0.80  CALCIUM 8.7*   ------------------------------------------------------------------------------------------------------------------  Cardiac Enzymes No results for input(s): TROPONINI in the last 168 hours. ------------------------------------------------------------  RADIOLOGY:  No results found.     Thank  you for the consultation and for allowing [facility] to assist in the care of your patient. Our recommendations are noted above.  Please contact us if we can be of further service.   Wells Guileseep Desi Carby, MD.  Board Certified in Internal Medicine, Pulmonary Medicine, Critical Care Medicine, and Sleep Medicine.  [facility] Office Number: [phone]  Santiago Gladavid Kasa, M.D.  Billy Fischeravid Simonds, M.D  03/21/2017

## 2017-03-21 NOTE — Progress Notes (Signed)
Cardiology Office Note   Date:  03/21/2017   ID:  Ranen, Doolin [birth year], MRN [id number]  PCP:  Derwood Kaplan, MD  Cardiologist:   Lorine Bears, MD   Chief Complaint  Patient presents with  . othe r    Follow up from cardiac cath & 6 month follow up. Meds reviewed by the pt. verbally. "doing well."        History of Present Illness: Jacob Ford is a 77 y.o. male who presents for a follow-up visit regarding coronary artery disease. He had non-ST elevation myocardial infarction November 2016. Cardiac catheterization showed occluded distal RCA with significant distal LAD disease. He underwent angioplasty and drug-eluting stent placement to the distal right coronary artery. Distal LAD disease was treated medically. Nuclear stress test in January 2017 showed no evidence of ischemia. 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 The patient was hospitalized recently with unstable angina.  Troponin was negative. Cardiac catheterization showed significant underlying two-vessel coronary artery disease with patent stent in the distal right coronary artery without significant restenosis.  Moderate proximal to mid LAD disease was stable as well as significant distal LAD stenosis.  These were unchanged from before.  EF was 50-55% with mildly elevated LVEDP.  The patient was noted to have an episode of SVT at the end of cardiac catheterization.  He was also noted to have sinus tachycardia and PVCs on telemetry and thus the dose of metoprolol was increased to 50 mg twice daily. Since hospital discharge, he has been feeling very well with no recurrent chest pain or significant dyspnea.  Unfortunately, he continues to smoke 1 pack/day. He could not afford Chantix.    Past Medical History:  Diagnosis Date  . Asthma   . CAD (coronary artery disease)    a.  2002 Cath: Nonobs dzs per patient;  b. 11/2014 NSTEMI/PCI: LM nl, LAD 30p, 60p, 80d, D1/2/3 min irregs, RI min irregs, LCX, 68m, OM1 min irregs, OM2 nl, OM3 50ost, RCA 40p, 100d (2.25x18 Xience Alpine DES), EF 55-65%; c. 01/2015 MV: nl EF, no ischemia.  [name] COPD (chronic obstructive pulmonary disease) (HCC)   . Cough    SMOKER. DENIES FEVER/WHEEZING/ COUGH NONPRODUCTIVE  . Dyspnea    chronic  . Hyperlipidemia   . Hypertension   . Hypertensive heart disease   . Myocardial infarction (HCC)    2016  . Peripheral vascular disease (HCC)   . Tobacco abuse   . Tobacco abuse     Past Surgical History:  Procedure Laterality Date  . CARDIAC CATHETERIZATION    . CARDIAC CATHETERIZATION Bilateral 12/08/2014   Procedure: Left Heart Cath and Coronary Angiography;  Surgeon: Iran Ouch, MD;  Location: [facility];  Service: Cardiovascular;  Laterality: Bilateral;  . CARDIAC CATHETERIZATION N/A 12/08/2014   Procedure: Coronary Stent Intervention;  Surgeon: Iran Ouch, MD;  Location: [facility];  Service: Cardiovascular;  Laterality: N/A;  . CORONARY ANGIOPLASTY     stent 2016  . ENDOVASCULAR REPAIR/STENT GRAFT N/A 02/10/2016   Procedure: Endovascular Repair/Stent Graft;  Surgeon: Annice Needy, MD;  Location: [facility];  Service: Cardiovascular;  Laterality: N/A;  . LEFT HEART CATH AND CORONARY ANGIOGRAPHY N/A 03/06/2017   Procedure: LEFT HEART CATH AND CORONARY ANGIOGRAPHY;  Surgeon: Iran Ouch, MD;  Location: [facility];  Service: Cardiovascular;  Laterality: N/A;  .  PERIPHERAL VASCULAR CATHETERIZATION Right 01/18/2016   Procedure: Embolization;  Surgeon: Annice NeedyJason S Dew, MD;  Location: [facility];  Service: Cardiovascular;  Laterality: Right;  . SHOULDER SURGERY Left      Current Outpatient Medications  Medication Sig Dispense Refill  . aspirin EC 81 MG EC tablet Take 1 tablet (81 mg total) by mouth daily. 30 tablet 2  . atorvastatin  (LIPITOR) 80 MG tablet Take 1 tablet (80 mg total) by mouth daily at 6 PM. 30 tablet 2  . clopidogrel (PLAVIX) 75 MG tablet TAKE ONE TABLET BY MOUTH ONCE DAILY 90 tablet 3  . Fluticasone-Salmeterol (ADVAIR DISKUS) 250-50 MCG/DOSE AEPB Inhale 1 puff into the lungs 2 (two) times daily. Rinse mouth after use. 1 each 11  . ipratropium-albuterol (DUONEB) 0.5-2.5 (3) MG/3ML SOLN Take 3 mLs by nebulization every 6 (six) hours. 360 mL   . metoprolol tartrate (LOPRESSOR) 50 MG tablet Take 50 mg by mouth 2 (two) times daily.     . montelukast (SINGULAIR) 10 MG tablet Take 10 mg by mouth at bedtime.    . nitroGLYCERIN (NITROSTAT) 0.4 MG SL tablet Place 1 tablet (0.4 mg total) under the tongue every 5 (five) minutes as needed for chest pain. 25 tablet 3  . pantoprazole (PROTONIX) 40 MG tablet Take 1 tablet (40 mg total) by mouth daily. 30 tablet 1  . tamsulosin (FLOMAX) 0.4 MG CAPS capsule Take 1 capsule (0.4 mg total) by mouth daily. 30 capsule 1  . lisinopril-hydrochlorothiazide (ZESTORETIC) 10-12.5 MG tablet Take 1 tablet by mouth daily. 90 tablet 1  . varenicline (CHANTIX CONTINUING MONTH PAK) 1 MG tablet Take 1 tablet (1 mg total) by mouth 2 (two) times daily. (Patient not taking: Reported on 03/21/2017) 60 tablet 1  . varenicline (CHANTIX STARTING MONTH PAK) 0.5 MG X 11 & 1 MG X 42 tablet Take one 0.5 mg tab once daily for 3 days, then increase to one 0.5 mg tab twice daily for 4 days, then increase to one 1 mg tab twice daily. (Patient not taking: Reported on 03/21/2017) 53 tablet 0   No current facility-administered medications for this visit.     Allergies:   Patient has no known allergies.    Social History:  The patient  reports that he has been smoking cigarettes.  He has been smoking about 1.00 pack per day. he has never used smokeless tobacco. He reports that he does not drink alcohol or use drugs.   Family History:  The patient's family history includes CAD in his brother and brother;  Hypertension in his father and mother; Stroke in his sister and sister.    ROS:  Please see the history of present illness.   Otherwise, review of systems are positive for none.   All other systems are reviewed and negative.    PHYSICAL EXAM: VS:  BP 100/70 (BP Location: Left Arm, Patient Position: Sitting, Cuff Size: Normal)   Pulse 89   Ht 6\' 2"  (1.88 m)   Wt 224 lb 8 oz (101.8 kg)   BMI 28.82 kg/m  , BMI Body mass index is 28.82 kg/m. GEN: Well nourished, well developed, in no acute distress  HEENT: normal  Neck: no JVD, carotid bruits, or masses Cardiac: RRR; no murmurs, rubs, or gallops,no edema  Respiratory:  clear to auscultation bilaterally, normal work of breathing GI: soft, nontender, nondistended, + BS MS: no deformity or atrophy  Skin: warm and dry, no rash Neuro:  Strength and sensation are intact Psych:  euthymic mood, full affect Right radial pulse is absent.  Left radial pulse is normal with no hematoma.  EKG:  EKG is ordered today. The ekg ordered today demonstrates sinus rhythm with PVCs and nonspecific ST changes.   Recent Labs: 03/03/2017: Hemoglobin 10.5; Platelets 261 03/14/2017: BUN 14; Creatinine, Ser 0.80; Potassium 3.1; Sodium 134    Lipid Panel    Component Value Date/Time   CHOL 122 01/15/2015 1110   CHOL 161 12/14/2012 0346   TRIG 68 01/15/2015 1110   TRIG 140 12/14/2012 0346   HDL 36 (L) 01/15/2015 1110   HDL 47 12/14/2012 0346   CHOLHDL 3.4 01/15/2015 1110   CHOLHDL 4.6 12/08/2014 0337   VLDL 10 12/08/2014 0337   VLDL 28 12/14/2012 0346   LDLCALC 72 01/15/2015 1110   LDLCALC 86 12/14/2012 0346      Wt Readings from Last 3 Encounters:  03/21/17 224 lb 8 oz (101.8 kg)  03/14/17 227 lb (103 kg)  03/06/17 227 lb (103 kg)      No flowsheet data found.    ASSESSMENT AND PLAN:  1.  Coronary artery disease involving native coronary arteries without angina: Recent worsening of chest pain and hospitalization was thought to be due to  tachyarrhythmia.  Symptoms improved with increasing the dose of metoprolol to 50 mg twice daily.  2. Essential hypertension: Blood pressure is running low since increasing metoprolol.  Thus, I decreased the dose of lisinopril-hydrochlorothiazide by half.  3. Hyperlipidemia: Continue high dose atorvastatin. Most recent LDL was 72.  The patient will need a follow-up lipid and liver profile.  4. Tobacco use:I discussed with him the importance of smoking cessation again.  He was prescribed Chantix during last visit but he could not afford the medication.  5.  Abdominal aortic and iliac aneurysms: Followed by Dr. dew.   Disposition:   FU with me in 3 months  Signed,  Lorine Bears, MD  03/21/2017 2:04 PM    [facility]

## 2017-03-21 NOTE — Patient Instructions (Addendum)
Start Advair, rinse mouth after use.  Will send for pulmonary function test before next visit in 3 months.   --Quitting smoking is the most important thing that you can do for your health.  --Quitting smoking will have greater affect on your health than any medicine that we can give you.

## 2017-03-21 NOTE — Addendum Note (Signed)
Addended by: Janean SarkSNIPES, Samaad Hashem K on: 03/21/2017 12:04 PM   Modules accepted: Orders

## 2017-03-22 ENCOUNTER — Other Ambulatory Visit: Payer: Self-pay

## 2017-03-22 ENCOUNTER — Emergency Department: Payer: Medicare Other

## 2017-03-22 ENCOUNTER — Encounter: Payer: Self-pay | Admitting: Internal Medicine

## 2017-03-22 ENCOUNTER — Observation Stay (HOSPITAL_BASED_OUTPATIENT_CLINIC_OR_DEPARTMENT_OTHER)
Admit: 2017-03-22 | Discharge: 2017-03-22 | Disposition: A | Discharge status: Home / Self Care | Payer: Medicare Other | Attending: Cardiovascular Disease | Admitting: Cardiovascular Disease

## 2017-03-22 ENCOUNTER — Observation Stay
Admission: EM | Admit: 2017-03-22 | Discharge: 2017-03-24 | Disposition: A | Discharge status: Home / Self Care | Payer: Medicare Other | Attending: Internal Medicine | Admitting: Internal Medicine

## 2017-03-22 DIAGNOSIS — I700 Atherosclerosis of aorta: Secondary | ICD-10-CM | POA: Diagnosis not present

## 2017-03-22 DIAGNOSIS — E785 Hyperlipidemia, unspecified: Secondary | ICD-10-CM | POA: Diagnosis not present

## 2017-03-22 DIAGNOSIS — I714 Abdominal aortic aneurysm, without rupture: Secondary | ICD-10-CM | POA: Insufficient documentation

## 2017-03-22 DIAGNOSIS — I25118 Atherosclerotic heart disease of native coronary artery with other forms of angina pectoris: Secondary | ICD-10-CM | POA: Diagnosis not present

## 2017-03-22 DIAGNOSIS — I472 Ventricular tachycardia: Secondary | ICD-10-CM | POA: Diagnosis not present

## 2017-03-22 DIAGNOSIS — Z955 Presence of coronary angioplasty implant and graft: Secondary | ICD-10-CM | POA: Insufficient documentation

## 2017-03-22 DIAGNOSIS — I119 Hypertensive heart disease without heart failure: Secondary | ICD-10-CM | POA: Insufficient documentation

## 2017-03-22 DIAGNOSIS — J449 Chronic obstructive pulmonary disease, unspecified: Secondary | ICD-10-CM | POA: Diagnosis not present

## 2017-03-22 DIAGNOSIS — Z7902 Long term (current) use of antithrombotics/antiplatelets: Secondary | ICD-10-CM | POA: Insufficient documentation

## 2017-03-22 DIAGNOSIS — I214 Non-ST elevation (NSTEMI) myocardial infarction: Secondary | ICD-10-CM

## 2017-03-22 DIAGNOSIS — D649 Anemia, unspecified: Secondary | ICD-10-CM

## 2017-03-22 DIAGNOSIS — E860 Dehydration: Secondary | ICD-10-CM | POA: Diagnosis not present

## 2017-03-22 DIAGNOSIS — R55 Syncope and collapse: Secondary | ICD-10-CM | POA: Diagnosis present

## 2017-03-22 DIAGNOSIS — I951 Orthostatic hypotension: Secondary | ICD-10-CM | POA: Diagnosis not present

## 2017-03-22 DIAGNOSIS — Z79899 Other long term (current) drug therapy: Secondary | ICD-10-CM | POA: Insufficient documentation

## 2017-03-22 DIAGNOSIS — Z7982 Long term (current) use of aspirin: Secondary | ICD-10-CM | POA: Insufficient documentation

## 2017-03-22 DIAGNOSIS — N179 Acute kidney failure, unspecified: Secondary | ICD-10-CM | POA: Diagnosis not present

## 2017-03-22 DIAGNOSIS — E861 Hypovolemia: Secondary | ICD-10-CM | POA: Diagnosis not present

## 2017-03-22 DIAGNOSIS — I9589 Other hypotension: Secondary | ICD-10-CM

## 2017-03-22 DIAGNOSIS — D509 Iron deficiency anemia, unspecified: Secondary | ICD-10-CM | POA: Insufficient documentation

## 2017-03-22 DIAGNOSIS — I252 Old myocardial infarction: Secondary | ICD-10-CM | POA: Diagnosis not present

## 2017-03-22 DIAGNOSIS — F17210 Nicotine dependence, cigarettes, uncomplicated: Secondary | ICD-10-CM | POA: Diagnosis not present

## 2017-03-22 DIAGNOSIS — I739 Peripheral vascular disease, unspecified: Secondary | ICD-10-CM | POA: Diagnosis not present

## 2017-03-22 DIAGNOSIS — I350 Nonrheumatic aortic (valve) stenosis: Secondary | ICD-10-CM | POA: Diagnosis not present

## 2017-03-22 DIAGNOSIS — I2510 Atherosclerotic heart disease of native coronary artery without angina pectoris: Secondary | ICD-10-CM | POA: Diagnosis not present

## 2017-03-22 DIAGNOSIS — N171 Acute kidney failure with acute cortical necrosis: Secondary | ICD-10-CM | POA: Diagnosis not present

## 2017-03-22 LAB — BASIC METABOLIC PANEL
Anion gap: 12 (ref 5–15)
BUN: 17 mg/dL (ref 6–20)
CALCIUM: 8.3 mg/dL — AB (ref 8.9–10.3)
CO2: 27 mmol/L (ref 22–32)
Chloride: 97 mmol/L — ABNORMAL LOW (ref 101–111)
Creatinine, Ser: 1.44 mg/dL — ABNORMAL HIGH (ref 0.61–1.24)
GFR calc Af Amer: 53 mL/min — ABNORMAL LOW (ref >60)
GFR calc non Af Amer: 46 mL/min — ABNORMAL LOW (ref >60)
GLUCOSE: 156 mg/dL — AB (ref 65–99)
Potassium: 3.5 mmol/L (ref 3.5–5.1)
Sodium: 136 mmol/L (ref 135–145)

## 2017-03-22 LAB — CBC
HCT: 33.9 % — ABNORMAL LOW (ref 40.0–52.0)
Hemoglobin: 9.6 g/dL — ABNORMAL LOW (ref 13.0–18.0)
MCH: 20 pg — AB (ref 26.0–34.0)
MCHC: 28.5 g/dL — AB (ref 32.0–36.0)
MCV: 70.3 fL — ABNORMAL LOW (ref 80.0–100.0)
Platelets: 214 10*3/uL (ref 150–440)
RBC: 4.82 MIL/uL (ref 4.40–5.90)
RDW: 23.7 % — ABNORMAL HIGH (ref 11.5–14.5)
WBC: 8.2 10*3/uL (ref 3.8–10.6)

## 2017-03-22 LAB — IRON AND TIBC
Iron: 15 ug/dL — ABNORMAL LOW (ref 45–182)
SATURATION RATIOS: 4 % — AB (ref 17.9–39.5)
TIBC: 402 ug/dL (ref 250–450)
UIBC: 387 ug/dL

## 2017-03-22 LAB — FERRITIN: Ferritin: 40 ng/mL (ref 24–336)

## 2017-03-22 LAB — TROPONIN I

## 2017-03-22 MED ORDER — ONDANSETRON HCL 4 MG PO TABS: 4.0000 mg | ORAL_TABLET | Freq: Four times a day (QID) | ORAL | Status: DC | PRN

## 2017-03-22 MED ORDER — MONTELUKAST SODIUM 10 MG PO TABS
10.0000 mg | ORAL_TABLET | Freq: Every day | ORAL | Status: DC
  Administered 2017-03-22 – 2017-03-23 (×2): 10 mg via ORAL
  Filled 2017-03-22 (×2): qty 1

## 2017-03-22 MED ORDER — ACETAMINOPHEN 325 MG PO TABS
650.0000 mg | ORAL_TABLET | Freq: Four times a day (QID) | ORAL | Status: DC | PRN
Start: 2017-03-22 — End: 2017-03-24

## 2017-03-22 MED ORDER — ATORVASTATIN CALCIUM 20 MG PO TABS
80.0000 mg | ORAL_TABLET | Freq: Every day | ORAL | Status: DC
  Administered 2017-03-23: 80 mg via ORAL
  Filled 2017-03-22: qty 4

## 2017-03-22 MED ORDER — SODIUM CHLORIDE 0.9 % IV BOLUS (SEPSIS)
1000.0000 mL | Freq: Once | INTRAVENOUS | Status: AC
  Administered 2017-03-22: 1000 mL via INTRAVENOUS

## 2017-03-22 MED ORDER — METOPROLOL TARTRATE 50 MG PO TABS
50.0000 mg | ORAL_TABLET | Freq: Two times a day (BID) | ORAL | Status: DC
  Administered 2017-03-22 – 2017-03-23 (×2): 50 mg via ORAL
  Filled 2017-03-22 (×2): qty 1

## 2017-03-22 MED ORDER — LISINOPRIL 10 MG PO TABS: 10.0000 mg | ORAL_TABLET | Freq: Every day | ORAL | Status: DC

## 2017-03-22 MED ORDER — LISINOPRIL-HYDROCHLOROTHIAZIDE 10-12.5 MG PO TABS: 1.0000 | ORAL_TABLET | Freq: Every day | ORAL | Status: DC

## 2017-03-22 MED ORDER — TAMSULOSIN HCL 0.4 MG PO CAPS
0.4000 mg | ORAL_CAPSULE | Freq: Every day | ORAL | Status: DC
  Administered 2017-03-23 – 2017-03-24 (×2): 0.4 mg via ORAL
  Filled 2017-03-22 (×2): qty 1

## 2017-03-22 MED ORDER — SENNOSIDES-DOCUSATE SODIUM 8.6-50 MG PO TABS: 1.0000 | ORAL_TABLET | Freq: Every evening | ORAL | Status: DC | PRN

## 2017-03-22 MED ORDER — NITROGLYCERIN 0.4 MG SL SUBL: 0.4000 mg | SUBLINGUAL_TABLET | SUBLINGUAL | Status: DC | PRN

## 2017-03-22 MED ORDER — SODIUM CHLORIDE 0.9% FLUSH
3.0000 mL | Freq: Two times a day (BID) | INTRAVENOUS | Status: DC
  Administered 2017-03-22 – 2017-03-24 (×4): 3 mL via INTRAVENOUS

## 2017-03-22 MED ORDER — ONDANSETRON HCL 4 MG/2ML IJ SOLN: 4.0000 mg | Freq: Four times a day (QID) | INTRAMUSCULAR | Status: DC | PRN

## 2017-03-22 MED ORDER — PANTOPRAZOLE SODIUM 40 MG PO TBEC
40.0000 mg | DELAYED_RELEASE_TABLET | Freq: Every day | ORAL | Status: DC
  Administered 2017-03-23 – 2017-03-24 (×2): 40 mg via ORAL
  Filled 2017-03-22 (×2): qty 1

## 2017-03-22 MED ORDER — ASPIRIN EC 81 MG PO TBEC
81.0000 mg | DELAYED_RELEASE_TABLET | Freq: Every day | ORAL | Status: DC
  Administered 2017-03-23 – 2017-03-24 (×2): 81 mg via ORAL
  Filled 2017-03-22 (×2): qty 1

## 2017-03-22 MED ORDER — ACETAMINOPHEN 650 MG RE SUPP: 650.0000 mg | Freq: Four times a day (QID) | RECTAL | Status: DC | PRN

## 2017-03-22 MED ORDER — ENOXAPARIN SODIUM 40 MG/0.4ML ~~LOC~~ SOLN
40.0000 mg | SUBCUTANEOUS | Status: DC
  Filled 2017-03-22: qty 0.4

## 2017-03-22 MED ORDER — PERFLUTREN LIPID MICROSPHERE
1.0000 mL | INTRAVENOUS | Status: AC | PRN
  Administered 2017-03-22: 2 mL via INTRAVENOUS
  Filled 2017-03-22: qty 10

## 2017-03-22 MED ORDER — MOMETASONE FURO-FORMOTEROL FUM 200-5 MCG/ACT IN AERO
2.0000 | INHALATION_SPRAY | Freq: Two times a day (BID) | RESPIRATORY_TRACT | Status: DC
  Administered 2017-03-22 – 2017-03-24 (×4): 2 via RESPIRATORY_TRACT
  Filled 2017-03-22: qty 8.8

## 2017-03-22 MED ORDER — HYDROCHLOROTHIAZIDE 12.5 MG PO CAPS: 12.5000 mg | ORAL_CAPSULE | Freq: Every day | ORAL | Status: DC

## 2017-03-22 MED ORDER — IPRATROPIUM-ALBUTEROL 0.5-2.5 (3) MG/3ML IN SOLN
3.0000 mL | Freq: Four times a day (QID) | RESPIRATORY_TRACT | Status: DC
  Administered 2017-03-22 – 2017-03-24 (×8): 3 mL via RESPIRATORY_TRACT
  Filled 2017-03-22 (×8): qty 3

## 2017-03-22 MED ORDER — CLOPIDOGREL BISULFATE 75 MG PO TABS
75.0000 mg | ORAL_TABLET | Freq: Every day | ORAL | Status: DC
  Administered 2017-03-23 – 2017-03-24 (×2): 75 mg via ORAL
  Filled 2017-03-22 (×2): qty 1

## 2017-03-22 NOTE — Consult Note (Signed)
Cardiology Consultation:   Patient ID: Jacob Ford; [id number]; [birth year]   Admit date: 03/22/2017 Date of Consult: 03/22/2017  Primary Care Provider: Edsel Petrin, DO Primary Cardiologist: Terrilee Files Reason for consult: Syncope Physician requesting consult: Dr. Betti Cruz  Patient Profile:   Jacob Ford is a 77 y.o. male with a hx  of coronary artery disease, non-STEMI November 2016, stent to the distal RCA moderate LAD disease, COPD, smoker, hyperlipidemia, AAA status post endovascular repair, hypertension who presents with several episodes of syncope   History of Present Illness:   Recent discharge from the hospital February 25th 2019 for chest pain, positive stress test, cardiac catheterization showing stable cardiac disease,   Seen yesterday in the office by Dr. Kirke Corin  at that time noted to have elevated heart rate, low blood pressure, Was told to decrease his lisinopril HCTZ in half down to 10/12.5 mg daily Does not remember getting these directions, is following the medication handout list from recent discharge from the hospital several weeks ago, taking lisinopril HCTZ 20/25  Showed up in the office yesterday thinking that he was not supposed to eat or drink anything all day and all night.  Was told by the office that they did not give him those directions  This morning reported he was home and was dizzy with standing, even with sitting having lightheadedness, had several episodes of syncope EMTs called and had difficulty obtaining blood pressure He was hypotensive systolic pressure 90s, tachycardic heart rate 114  given 1 L IV fluids in the emergency room   Lab work reviewed showing elevated creatinine 1.4 Also drop in hemoglobin 9.6, Three months ago was 13.3    Past Medical History:  Diagnosis Date  . Asthma   . CAD (coronary artery disease)    a. 2002 Cath: Nonobs dzs per patient;  b. 11/2014 NSTEMI/PCI: LM nl, LAD 30p, 60p, 80d, D1/2/3 min irregs, RI min  irregs, LCX, 62m, OM1 min irregs, OM2 nl, OM3 50ost, RCA 40p, 100d (2.25x18 Xience Alpine DES), EF 55-65%; c. 01/2015 MV: nl EF, no ischemia.  [name] COPD (chronic obstructive pulmonary disease) (HCC)   . Cough    SMOKER. DENIES FEVER/WHEEZING/ COUGH NONPRODUCTIVE  . Dyspnea    chronic  . Hyperlipidemia   . Hypertension   . Hypertensive heart disease   . Myocardial infarction (HCC)    2016  . Peripheral vascular disease (HCC)   . Tobacco abuse   . Tobacco abuse     Past Surgical History:  Procedure Laterality Date  . CARDIAC CATHETERIZATION    . CARDIAC CATHETERIZATION Bilateral 12/08/2014   Procedure: Left Heart Cath and Coronary Angiography;  Surgeon: Iran Ouch, MD;  Location: [facility] INVASIVE CV LAB;  Service: Cardiovascular;  Laterality: Bilateral;  . CARDIAC CATHETERIZATION N/A 12/08/2014   Procedure: Coronary Stent Intervention;  Surgeon: Iran Ouch, MD;  Location: [facility] INVASIVE CV LAB;  Service: Cardiovascular;  Laterality: N/A;  . CORONARY ANGIOPLASTY     stent 2016  . ENDOVASCULAR REPAIR/STENT GRAFT N/A 02/10/2016   Procedure: Endovascular Repair/Stent Graft;  Surgeon: Annice Needy, MD;  Location: [facility] INVASIVE CV LAB;  Service: Cardiovascular;  Laterality: N/A;  . LEFT HEART CATH AND CORONARY ANGIOGRAPHY N/A 03/06/2017   Procedure: LEFT HEART CATH AND CORONARY ANGIOGRAPHY;  Surgeon: Iran Ouch, MD;  Location: [facility] INVASIVE CV LAB;  Service: Cardiovascular;  Laterality: N/A;  . PERIPHERAL VASCULAR CATHETERIZATION Right 01/18/2016   Procedure: Embolization;  Surgeon: Annice Needy, MD;  Location: [facility]  INVASIVE CV LAB;  Service: Cardiovascular;  Laterality: Right;  . SHOULDER SURGERY Left      Home Medications:  Prior to Admission medications   Medication Sig Start Date End Date Taking? Authorizing Provider  aspirin EC 81 MG EC tablet Take 1 tablet (81 mg total) by mouth daily. 12/09/14  Yes Enedina FinnerPatel, Sona, MD  atorvastatin (LIPITOR) 80 MG tablet Take 1 tablet (80 mg  total) by mouth daily at 6 PM. 12/09/14  Yes Enedina FinnerPatel, Sona, MD  clopidogrel (PLAVIX) 75 MG tablet TAKE ONE TABLET BY MOUTH ONCE DAILY 08/09/16  Yes Iran OuchArida, Muhammad A, MD  Fluticasone-Salmeterol (ADVAIR DISKUS) 250-50 MCG/DOSE AEPB Inhale 1 puff into the lungs 2 (two) times daily. Rinse mouth after use. 03/21/17 03/21/18 Yes Shane Crutchamachandran, Pradeep, MD  lisinopril-hydrochlorothiazide (ZESTORETIC) 10-12.5 MG tablet Take 1 tablet by mouth daily. 03/21/17  Yes Iran OuchArida, Muhammad A, MD  metoprolol tartrate (LOPRESSOR) 50 MG tablet Take 50 mg by mouth 2 (two) times daily.  04/04/16  Yes [provider]  montelukast (SINGULAIR) 10 MG tablet Take 10 mg by mouth at bedtime.   Yes [provider]  pantoprazole (PROTONIX) 40 MG tablet Take 1 tablet (40 mg total) by mouth daily. 03/06/17 03/06/18 Yes Auburn BilberryPatel, Shreyang, MD  tamsulosin (FLOMAX) 0.4 MG CAPS capsule Take 1 capsule (0.4 mg total) by mouth daily. 03/14/17  Yes Arnaldo NatalMalinda, Paul F, MD  ipratropium-albuterol (DUONEB) 0.5-2.5 (3) MG/3ML SOLN Take 3 mLs by nebulization every 6 (six) hours. 03/06/17   Auburn BilberryPatel, Shreyang, MD  nitroGLYCERIN (NITROSTAT) 0.4 MG SL tablet Place 1 tablet (0.4 mg total) under the tongue every 5 (five) minutes as needed for chest pain. 12/09/14   Antonieta IbaGollan, Mattisyn Cardona J, MD  varenicline (CHANTIX CONTINUING MONTH PAK) 1 MG tablet Take 1 tablet (1 mg total) by mouth 2 (two) times daily. Patient not taking: Reported on 03/21/2017 07/13/16   Iran OuchArida, Muhammad A, MD  varenicline (CHANTIX STARTING MONTH PAK) 0.5 MG X 11 & 1 MG X 42 tablet Take one 0.5 mg tab once daily for 3 days, then increase to one 0.5 mg tab twice daily for 4 days, then increase to one 1 mg tab twice daily. Patient not taking: Reported on 03/21/2017 06/13/16   Iran OuchArida, Muhammad A, MD    Inpatient Medications: Scheduled Meds: . [START ON 03/23/2017] aspirin EC  81 mg Oral Daily  . [START ON 03/23/2017] atorvastatin  80 mg Oral q1800  . [START ON 03/23/2017] clopidogrel  75 mg Oral Daily    . enoxaparin (LOVENOX) injection  40 mg Subcutaneous Q24H  . [START ON 03/23/2017] lisinopril  10 mg Oral Daily   And  . [START ON 03/23/2017] hydrochlorothiazide  12.5 mg Oral Daily  . ipratropium-albuterol  3 mL Nebulization Q6H  . metoprolol tartrate  50 mg Oral BID  . mometasone-formoterol  2 puff Inhalation BID  . montelukast  10 mg Oral QHS  . [START ON 03/23/2017] pantoprazole  40 mg Oral Daily  . sodium chloride flush  3 mL Intravenous Q12H  . [START ON 03/23/2017] tamsulosin  0.4 mg Oral Daily   Continuous Infusions:  PRN Meds: acetaminophen **OR** acetaminophen, nitroGLYCERIN, ondansetron **OR** ondansetron (ZOFRAN) IV, senna-docusate  Allergies:   No Known Allergies  Social History:   Social History   Socioeconomic History  . Marital status: Divorced    Spouse name: Not on file  . Number of children: Not on file  . Years of education: Not on file  . Highest education level: Not on file  Social  Needs  . Financial resource strain: Not on file  . Food insecurity - worry: Not on file  . Food insecurity - inability: Not on file  . Transportation needs - medical: Not on file  . Transportation needs - non-medical: Not on file  Occupational History  . Occupation: retired  Tobacco Use  . Smoking status: Current Every Day Smoker    Packs/day: 1.00    Types: Cigarettes  . Smokeless tobacco: Never Used  Substance and Sexual Activity  . Alcohol use: No  . Drug use: No  . Sexual activity: No  Other Topics Concern  . Not on file  Social History Narrative  . Not on file    Family History:    Family History  Problem Relation Age of Onset  . Hypertension Mother   . Hypertension Father   . CAD Brother   . CAD Brother   . Stroke Sister   . Stroke Sister      ROS:  Review of Systems  Constitutional: Positive for malaise/fatigue.  Respiratory: Negative.   Cardiovascular: Negative.   Gastrointestinal: Negative.   Musculoskeletal: Negative.   Neurological:  Positive for dizziness, loss of consciousness and weakness.  Psychiatric/Behavioral: Negative.   All other systems reviewed and are negative.    Physical Exam/Data:   Vitals:   03/22/17 1727 03/22/17 1730 03/22/17 1816 03/22/17 1825  BP:  124/73  100/68  Pulse: 80   93  Resp: (!) 27 (!) 22    Temp:      TempSrc:      SpO2: 97%  97% 91%  Weight:      Height:       No intake or output data in the 24 hours ending 03/22/17 1831 Filed Weights   03/22/17 1457  Weight: 222 lb (100.7 kg)   Body mass index is 28.5 kg/m.  General:  Well nourished, well developed, in no acute distress HEENT: normal Lymph: no adenopathy Neck: no JVD Endocrine:  No thryomegaly Vascular: No carotid bruits; FA pulses 2+ bilaterally without bruits  Cardiac:  normal S1, S2; RRR; no murmur  Lungs: Moderately decreased breath sounds throughout , scattered Rales at the bases  no wheezing, rhonchi  Abd: soft, nontender, no hepatomegaly  Ext: no edema Musculoskeletal:  No deformities, BUE and BLE strength normal and equal Skin: warm and dry  Neuro:  CNs 2-12 intact, no focal abnormalities noted Psych:  Normal affect   EKG:  The EKG was personally reviewed and demonstrates: Sinus tachycardia rate 114 bpm nonspecific ST abnormality first-degree AV block Telemetry:  Telemetry was personally reviewed and demonstrates: Sinus tachycardia  Relevant CV Studies: Cardiac catheterization 08-Mar-2017  Dist LAD lesion is 80% stenosed.  Prox LAD-2 lesion is 60% stenosed.  Prox LAD-1 lesion is 50% stenosed.  Ost 3rd Mrg to 3rd Mrg lesion is 50% stenosed.  Prox RCA to Mid RCA lesion is 50% stenosed.  Ost RPDA lesion is 50% stenosed.  Post Atrio lesion is 60% stenosed.  Previously placed Dist RCA drug eluting stent is widely patent.  Balloon angioplasty was performed.  1st RPLB lesion is 70% stenosed.  Mid Cx to Dist Cx lesion is 50% stenosed.  The left ventricular ejection fraction is 50-55% by  visual estimate.  LV end diastolic pressure is mildly elevated.  There is mild left ventricular systolic dysfunction.   1.  Significant underlying two-vessel coronary artery disease with patent stent in the distal right coronary artery without significant restenosis.  Known moderate proximal  to mid LAD disease and significant distal LAD stenosis both unchanged from before. 2. Low normal LV systolic function with an EF of 50-55%.  Mildly to moderately reduced LV EDP at 18-20 mmHg.   Laboratory Data:  Chemistry Recent Labs  Lab 03/22/17 1449  NA 136  K 3.5  CL 97*  CO2 27  GLUCOSE 156*  BUN 17  CREATININE 1.44*  CALCIUM 8.3*  GFRNONAA 46*  GFRAA 53*  ANIONGAP 12    No results for input(s): PROT, ALBUMIN, AST, ALT, ALKPHOS, BILITOT in the last 168 hours. Hematology Recent Labs  Lab 03/22/17 1449  WBC 8.2  RBC 4.82  HGB 9.6*  HCT 33.9*  MCV 70.3*  MCH 20.0*  MCHC 28.5*  RDW 23.7*  PLT 214   Cardiac Enzymes Recent Labs  Lab 03/22/17 1449  TROPONINI <0.03   No results for input(s): TROPIPOC in the last 168 hours.  BNPNo results for input(s): BNP, PROBNP in the last 168 hours.  DDimer No results for input(s): DDIMER in the last 168 hours.  Radiology/Studies:  Ct Head Wo Contrast  Result Date: 03/22/2017 CLINICAL DATA:  Initial evaluation for acute syncope. EXAM: CT HEAD WITHOUT CONTRAST TECHNIQUE: Contiguous axial images were obtained from the base of the skull through the vertex without intravenous contrast. COMPARISON:  Prior CT from 12/11/2012 FINDINGS: Brain: Age-appropriate cerebral volume. Mild chronic small vessel ischemic disease. Small remote right parietal infarct noted. No acute intracranial hemorrhage. No acute large vessel territory infarct. No mass lesion, midline shift or mass effect. No hydrocephalus. No extra-axial fluid collection. Vascular: No hyperdense vessel.Calcified atherosclerosis present at the skull base. Skull: Focal soft tissue scarring  noted at the right parietal scalp. No acute scalp soft tissue abnormality. Calvarium intact. Sinuses/Orbits: Globes normal soft tissues demonstrate no acute abnormality. Mild scattered mucosal thickening within the ethmoidal air cells and maxillary sinuses. Paranasal sinuses are otherwise clear. No mastoid effusion. Other: None. IMPRESSION: 1. No acute intracranial abnormality. 2. 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 Electronically Signed   By: Rise Mu M.D.   On: 03/22/2017 15:56   Dg Chest Port 1 View  Result Date: 03/22/2017 CLINICAL DATA:  Syncope. EXAM: PORTABLE CHEST 1 VIEW COMPARISON:  Radiographs of March 03, 2017. FINDINGS: Stable cardiomediastinal silhouette. No pneumothorax or pleural effusion is noted. Both lungs are clear. The visualized skeletal structures are unremarkable. IMPRESSION: No active disease. Electronically Signed   By: Lupita Raider, M.D.   On: 03/22/2017 15:38    Assessment and Plan:   1. Syncope Suspect multifactorial in the setting of dehydration, hypotension, anemia Was not drinking well the past day or so and is on HCTZ Did not cut back on his pill as directed in office yesterday. was noted to be hypotensive Etiology of anemia is unclear and will need workup Already starting to feel better after 1 L fluid, blood pressure improved  Currently on telemetry Echocardiogram pending Would likely be able to hold the lisinopril HCTZ entirely  2) acute renal failure Secondary to HCTZ, not drinking well Would hold HCTZ indefinitely Already received 1 L fluid  3) anemia Etiology unclear, Long discussion with him and family Recommend iron studies Stool guaiac Rapid drop in the past 3 months High risk of bleed as he is on aspirin Plavix  4) hypotension Likely secondary to dehydration, overmedicated in the setting of anemia  5) CAD. PCI Cardiac catheterization, medical management of his disease On  aspirin Plavix, statin beta-blocker  Total encounter time more than 110 minutes  Greater than 50% was spent in counseling and coordination of care with the patient  For questions or updates, please contact [facility] Please consult [email or web address] for contact info under Cardiology/STEMI.   Signed, Julien Nordmann, MD  03/22/2017 6:31 PM

## 2017-03-22 NOTE — ED Notes (Signed)
Pt given water at this time 

## 2017-03-22 NOTE — H&P (Signed)
[facility] - Ward at [facility]   PATIENT NAME: Jacob Ford    MR#:  [id number]  DATE OF BIRTH:  Jan 10, 1941  DATE OF ADMISSION:  03/22/2017  PRIMARY CARE PHYSICIAN: Edsel Petrin, DO   REQUESTING/REFERRING PHYSICIAN:   CHIEF COMPLAINT:   Chief Complaint  Patient presents with  . Loss of Consciousness    HISTORY OF PRESENT ILLNESS: Jacob Ford  is a 77 y.o. male with a known history of coronary artery disease, cardiac stent placement, tobacco abuse, hyperlipidemia, hypertension, peripheral vascular disease was brought to the emergency room after he lost consciousness at home.  I warned the family members patient passed out twice while talking to his son.  No history of any head injury.  No complaints of any chest pain.  He regained consciousness completely.  No witnessed seizure is corroborated by family members.  Patient recently saw his cardiologist Dr. Sharmon Revere in the clinic and has been compliant with his medications.  EKG reviewed had, no ST segment changes sinus tachycardia.  First set of troponins negative.  Patient was worked up with CT head which showed no acute abnormality.  Hospitalist service was consulted for further care.  PAST MEDICAL HISTORY:   Past Medical History:  Diagnosis Date  . Asthma   . CAD (coronary artery disease)    a. 2002 Cath: Nonobs dzs per patient;  b. 11/2014 NSTEMI/PCI: LM nl, LAD 30p, 60p, 80d, D1/2/3 min irregs, RI min irregs, LCX, 76m, OM1 min irregs, OM2 nl, OM3 50ost, RCA 40p, 100d (2.25x18 Xience Alpine DES), EF 55-65%; c. 01/2015 MV: nl EF, no ischemia.  [name] COPD (chronic obstructive pulmonary disease) (HCC)   . Cough    SMOKER. DENIES FEVER/WHEEZING/ COUGH NONPRODUCTIVE  . Dyspnea    chronic  . Hyperlipidemia   . Hypertension   . Hypertensive heart disease   . Myocardial infarction (HCC)    2016  . Peripheral vascular disease (HCC)   . Tobacco abuse   . Tobacco abuse     PAST SURGICAL HISTORY:  Past  Surgical History:  Procedure Laterality Date  . CARDIAC CATHETERIZATION    . CARDIAC CATHETERIZATION Bilateral 12/08/2014   Procedure: Left Heart Cath and Coronary Angiography;  Surgeon: Iran Ouch, MD;  Location: [facility] INVASIVE CV LAB;  Service: Cardiovascular;  Laterality: Bilateral;  . CARDIAC CATHETERIZATION N/A 12/08/2014   Procedure: Coronary Stent Intervention;  Surgeon: Iran Ouch, MD;  Location: [facility] INVASIVE CV LAB;  Service: Cardiovascular;  Laterality: N/A;  . CORONARY ANGIOPLASTY     stent 2016  . ENDOVASCULAR REPAIR/STENT GRAFT N/A 02/10/2016   Procedure: Endovascular Repair/Stent Graft;  Surgeon: Annice Needy, MD;  Location: [facility] INVASIVE CV LAB;  Service: Cardiovascular;  Laterality: N/A;  . LEFT HEART CATH AND CORONARY ANGIOGRAPHY N/A 03/06/2017   Procedure: LEFT HEART CATH AND CORONARY ANGIOGRAPHY;  Surgeon: Iran Ouch, MD;  Location: [facility] INVASIVE CV LAB;  Service: Cardiovascular;  Laterality: N/A;  . PERIPHERAL VASCULAR CATHETERIZATION Right 01/18/2016   Procedure: Embolization;  Surgeon: Annice Needy, MD;  Location: [facility] INVASIVE CV LAB;  Service: Cardiovascular;  Laterality: Right;  . SHOULDER SURGERY Left     SOCIAL HISTORY:  Social History   Tobacco Use  . Smoking status: Current Every Day Smoker    Packs/day: 1.00    Types: Cigarettes  . Smokeless tobacco: Never Used  Substance Use Topics  . Alcohol use: No    FAMILY HISTORY:  Family History  Problem Relation Age of  Onset  . Hypertension Mother   . Hypertension Father   . CAD Brother   . CAD Brother   . Stroke Sister   . Stroke Sister     DRUG ALLERGIES: No Known Allergies  REVIEW OF SYSTEMS:   CONSTITUTIONAL: No fever, fatigue or weakness.  EYES: No blurred or double vision.  EARS, NOSE, AND THROAT: No tinnitus or ear pain.  RESPIRATORY: No cough, shortness of breath, wheezing or hemoptysis.  CARDIOVASCULAR: No chest pain, orthopnea, edema.  GASTROINTESTINAL: No nausea, vomiting,  diarrhea or abdominal pain.  GENITOURINARY: No dysuria, hematuria.  ENDOCRINE: No polyuria, nocturia,  HEMATOLOGY: No anemia, easy bruising or bleeding SKIN: No rash or lesion. MUSCULOSKELETAL: No joint pain or arthritis.   NEUROLOGIC: No tingling, numbness, weakness.  Passed out twice prior to coming to hospital PSYCHIATRY: No anxiety or depression.   MEDICATIONS AT HOME:  Prior to Admission medications   Medication Sig Start Date End Date Taking? Authorizing Provider  aspirin EC 81 MG EC tablet Take 1 tablet (81 mg total) by mouth daily. 12/09/14  Yes Enedina FinnerPatel, Sona, MD  atorvastatin (LIPITOR) 80 MG tablet Take 1 tablet (80 mg total) by mouth daily at 6 PM. 12/09/14  Yes Enedina FinnerPatel, Sona, MD  clopidogrel (PLAVIX) 75 MG tablet TAKE ONE TABLET BY MOUTH ONCE DAILY 08/09/16  Yes Iran OuchArida, Muhammad A, MD  Fluticasone-Salmeterol (ADVAIR DISKUS) 250-50 MCG/DOSE AEPB Inhale 1 puff into the lungs 2 (two) times daily. Rinse mouth after use. 03/21/17 03/21/18 Yes Shane Crutchamachandran, Pradeep, MD  lisinopril-hydrochlorothiazide (ZESTORETIC) 10-12.5 MG tablet Take 1 tablet by mouth daily. 03/21/17  Yes Iran OuchArida, Muhammad A, MD  metoprolol tartrate (LOPRESSOR) 50 MG tablet Take 50 mg by mouth 2 (two) times daily.  04/04/16  Yes [provider]  montelukast (SINGULAIR) 10 MG tablet Take 10 mg by mouth at bedtime.   Yes [provider]  pantoprazole (PROTONIX) 40 MG tablet Take 1 tablet (40 mg total) by mouth daily. 03/06/17 03/06/18 Yes Auburn BilberryPatel, Shreyang, MD  tamsulosin (FLOMAX) 0.4 MG CAPS capsule Take 1 capsule (0.4 mg total) by mouth daily. 03/14/17  Yes Arnaldo NatalMalinda, Paul F, MD  ipratropium-albuterol (DUONEB) 0.5-2.5 (3) MG/3ML SOLN Take 3 mLs by nebulization every 6 (six) hours. 03/06/17   Auburn BilberryPatel, Shreyang, MD  nitroGLYCERIN (NITROSTAT) 0.4 MG SL tablet Place 1 tablet (0.4 mg total) under the tongue every 5 (five) minutes as needed for chest pain. 12/09/14   Antonieta IbaGollan, Timothy J, MD  varenicline (CHANTIX CONTINUING MONTH  PAK) 1 MG tablet Take 1 tablet (1 mg total) by mouth 2 (two) times daily. Patient not taking: Reported on 03/21/2017 07/13/16   Iran OuchArida, Muhammad A, MD  varenicline (CHANTIX STARTING MONTH PAK) 0.5 MG X 11 & 1 MG X 42 tablet Take one 0.5 mg tab once daily for 3 days, then increase to one 0.5 mg tab twice daily for 4 days, then increase to one 1 mg tab twice daily. Patient not taking: Reported on 03/21/2017 06/13/16   Iran OuchArida, Muhammad A, MD      PHYSICAL EXAMINATION:   VITAL SIGNS: Blood pressure 101/71, pulse 83, temperature 98.1 F (36.7 C), temperature source Oral, resp. rate (!) 26, height 6\' 2"  (1.88 m), weight 100.7 kg (222 lb), SpO2 100 %.  GENERAL:  77 y.o.-year-old patient lying in the bed with no acute distress on oxygen via nasal canula EYES: Pupils equal, round, reactive to light and accommodation. No scleral icterus. Extraocular muscles intact.  HEENT: Head atraumatic, normocephalic. Oropharynx and nasopharynx clear.  NECK:  Supple, no jugular venous distention. No thyroid enlargement, no tenderness.  LUNGS: Normal breath sounds bilaterally, no wheezing, rales,rhonchi or crepitation. No use of accessory muscles of respiration.  CARDIOVASCULAR: S1, S2 normal. No murmurs, rubs, or gallops.  ABDOMEN: Soft, nontender, nondistended. Bowel sounds present. No organomegaly or mass.  EXTREMITIES: No pedal edema, cyanosis, or clubbing.  NEUROLOGIC: Cranial nerves II through XII are intact. Muscle strength 5/5 in all extremities. Sensation intact. Gait not checked.  PSYCHIATRIC: The patient is alert and oriented x 3.  SKIN: No obvious rash, lesion, or ulcer.   LABORATORY PANEL:   CBC Recent Labs  Lab 03/22/17 1449  WBC 8.2  HGB 9.6*  HCT 33.9*  PLT 214  MCV 70.3*  MCH 20.0*  MCHC 28.5*  RDW 23.7*   ------------------------------------------------------------------------------------------------------------------  Chemistries  Recent Labs  Lab 03/22/17 1449  NA 136  K 3.5  CL 97*   CO2 27  GLUCOSE 156*  BUN 17  CREATININE 1.44*  CALCIUM 8.3*   ------------------------------------------------------------------------------------------------------------------ estimated creatinine clearance is 55.3 mL/min (A) (by C-G formula based on SCr of 1.44 mg/dL (H)). ------------------------------------------------------------------------------------------------------------------ No results for input(s): TSH, T4TOTAL, T3FREE, THYROIDAB in the last 72 hours.  Invalid input(s): FREET3   Coagulation profile No results for input(s): INR, PROTIME in the last 168 hours. ------------------------------------------------------------------------------------------------------------------- No results for input(s): DDIMER in the last 72 hours. -------------------------------------------------------------------------------------------------------------------  Cardiac Enzymes Recent Labs  Lab 03/22/17 1449  TROPONINI <0.03   ------------------------------------------------------------------------------------------------------------------ Invalid input(s): POCBNP  ---------------------------------------------------------------------------------------------------------------  Urinalysis    Component Value Date/Time   COLORURINE YELLOW (A) 03/14/2017 1222   APPEARANCEUR CLEAR (A) 03/14/2017 1222   APPEARANCEUR Clear 12/11/2012 0339   LABSPEC 1.011 03/14/2017 1222   LABSPEC 1.006 12/11/2012 0339   PHURINE 6.0 03/14/2017 1222   GLUCOSEU NEGATIVE 03/14/2017 1222   GLUCOSEU Negative 12/11/2012 0339   HGBUR NEGATIVE 03/14/2017 1222   BILIRUBINUR NEGATIVE 03/14/2017 1222   BILIRUBINUR Negative 12/11/2012 0339   KETONESUR NEGATIVE 03/14/2017 1222   PROTEINUR NEGATIVE 03/14/2017 1222   NITRITE NEGATIVE 03/14/2017 1222   LEUKOCYTESUR NEGATIVE 03/14/2017 1222   LEUKOCYTESUR Negative 12/11/2012 0339     RADIOLOGY: Ct Head Wo Contrast  Result Date: 03/22/2017 CLINICAL DATA:   Initial evaluation for acute syncope. EXAM: CT HEAD WITHOUT CONTRAST TECHNIQUE: Contiguous axial images were obtained from the base of the skull through the vertex without intravenous contrast. COMPARISON:  Prior CT from 12/11/2012 FINDINGS: Brain: Age-appropriate cerebral volume. Mild chronic small vessel ischemic disease. Small remote right parietal infarct noted. No acute intracranial hemorrhage. No acute large vessel territory infarct. No mass lesion, midline shift or mass effect. No hydrocephalus. No extra-axial fluid collection. Vascular: No hyperdense vessel.Calcified atherosclerosis present at the skull base. Skull: Focal soft tissue scarring noted at the right parietal scalp. No acute scalp soft tissue abnormality. Calvarium intact. Sinuses/Orbits: Globes normal soft tissues demonstrate no acute abnormality. Mild scattered mucosal thickening within the ethmoidal air cells and maxillary sinuses. Paranasal sinuses are otherwise clear. No mastoid effusion. Other: None. IMPRESSION: 1. No acute intracranial abnormality. 2. 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 Electronically Signed   By: Rise Mu M.D.   On: 03/22/2017 15:56   Dg Chest Port 1 View  Result Date: 03/22/2017 CLINICAL DATA:  Syncope. EXAM: PORTABLE CHEST 1 VIEW COMPARISON:  Radiographs of March 03, 2017. FINDINGS: Stable cardiomediastinal silhouette. No pneumothorax or pleural effusion is noted. Both lungs are clear. The visualized skeletal structures are unremarkable. IMPRESSION: No active disease. Electronically Signed  By: Lupita Raider, M.D.   On: 03/22/2017 15:38    EKG: Orders placed or performed during the hospital encounter of 03/22/17  . ED EKG  . ED EKG  . EKG 12-Lead  . EKG 12-Lead    IMPRESSION AND PLAN:  77 year old male patient with history of coronary disease, cardiac stent, hypertension, hyperlipidemia, peripheral vascular disease, tobacco abuse  presented to the emergency room after passing out.  1.  Syncope and collapse Admit patient to telemetry observation bed Cycle troponin to rule out ischemia Echocardiogram Cardiology evaluation  2 coronary artery disease with history of stent. Resume aspirin and Plavix Restart beta-blocker and ACE inhibitor  3.  Peripheral vascular disease Continue aspirin  4.  Tobacco abuse Tobacco cessation consult with the patient  5.Hypertension Continue ACE inhibitor  All the records are reviewed and case discussed with ED provider. Management plans discussed with the patient, family and they are in agreement.  CODE STATUS:FULL CODE Code Status History    Date Active Date Inactive Code Status Order ID Comments User Context   03/03/2017 14:41 03/06/2017 23:33 Full Code [id number]  Milagros Loll, MD ED   02/10/2016 12:27 02/11/2016 21:38 Full Code [id number]  Annice Needy, MD Inpatient   01/18/2016 11:20 01/18/2016 16:16 Full Code [id number]  Annice Needy, MD Inpatient   12/08/2014 10:02 12/09/2014 20:53 Full Code [id number]  Iran Ouch, MD Inpatient   12/07/2014 20:22 12/08/2014 10:02 Full Code [id number]  Adrian Saran, MD Inpatient       TOTAL TIME TAKING CARE OF THIS PATIENT: 50 minutes.    Ihor Austin M.D on 03/22/2017 at 5:14 PM  Between 7am to 6pm - Pager - [phone]  After 6pm go to [email or web address] - password [email or web address] [facility]  [facility]  Office  [phone]  CC: Primary care physician; Edsel Petrin, DO

## 2017-03-22 NOTE — ED Triage Notes (Signed)
He arrives today via [facility] from home where he was sitting and talking ot his son when he experienced a syncopal episode - per family pt recovered and then had another syncopal episode  EMS reports that pt was found unresponsive with no radial pulse and apneic  Rescue breaths provided by EMS and the pt became responsive   Pt alert upon arrival  Abdominal distention noted    Pt had a cardiac cath 03/13/2017 with no blockages present  Pt followed up with the cardiologist yesterday and was diagnosed with tachycardia

## 2017-03-22 NOTE — ED Notes (Signed)
Pt given urinal at this time, will continue to monitor for urine specimen

## 2017-03-22 NOTE — ED Provider Notes (Signed)
Signout from Dr. Shaune PollackLord in this 77 year old male with a recent hospitalization for chest pain status post cardiac catheterization without any new stents who is presenting to the hospital today with a syncope x2.  Patient, per his son, was unresponsive for about 1 minute.  EMS was in a regular blood pressure pulse.  However, patient regained full consciousness at this time.  No complaints at this time.  Recent catheterization with multiple areas of stenosis but without any new stents placed.  Signout from Dr. Shaune PollackLord is to follow-up with lab work.  Physical Exam  BP 101/69   Pulse 83   Temp 98.1 F (36.7 C) (Oral)   Resp 18   Ht 6\' 2"  (1.88 m)   Wt 100.7 kg (222 lb)   SpO2 100%   BMI 28.50 kg/m  ----------------------------------------- 4:59 PM on 03/22/2017 -----------------------------------------   Physical Exam Patient resting comfortably at this time without any distress.  Sinus rhythm with intermittent PVCs on the monitor. ED Course/Procedures     Procedures  MDM  Patient will be admitted to the hospital. Signed out to Dr. Hilton SinclairWeiting.  Remains on defibrillator pads.       Myrna BlazerSchaevitz, Shaquetta Arcos Matthew, MD 03/22/17 1700

## 2017-03-22 NOTE — ED Provider Notes (Signed)
[facility] Emergency Department Provider Note ____________________________________________   I have reviewed the triage vital signs and the triage nursing note.  HISTORY  Chief Complaint Loss of Consciousness   [facility] Patient [facility] report  HPI Jacob Ford is a 77 y.o. male presents for 2 syncopal episodes, possibly more at home while sitting and talking with his son.  EMS was called out for patient unresponsive after passing out while sitting.  EMS reports that fire department was on scene first and noted no radial pulses and patient was continued to be unresponsive.  No seizure activity reported.  Here patient is awake and alert and tachycardic but without any chest pain or trouble breathing.  Reports chest pain episode recently followed by cardiac cath which was reassuring, followed by cardiology follow-up yesterday and was told everything looked fine other than he had an elevated heart rate.  Patient reports no lightheadedness or dizziness slurred speech or any symptoms prior to the syncope today.  It sounds like there was 2 or 3 episodes, and 1 of which was fairly prolonged for him coming back to normal and pretty significant from the standpoint of not breathing adequately, unresponsive, and low blood pressure.   Past Medical History:  Diagnosis Date  . Asthma   . CAD (coronary artery disease)    a. 2002 Cath: Nonobs dzs per patient;  b. 11/2014 NSTEMI/PCI: LM nl, LAD 30p, 60p, 80d, D1/2/3 min irregs, RI min irregs, LCX, 67m, OM1 min irregs, OM2 nl, OM3 50ost, RCA 40p, 100d (2.25x18 Xience Alpine DES), EF 55-65%; c. 01/2015 MV: nl EF, no ischemia.  [name] COPD (chronic obstructive pulmonary disease) (HCC)   . Cough    SMOKER. DENIES FEVER/WHEEZING/ COUGH NONPRODUCTIVE  . Dyspnea    chronic  . Hyperlipidemia   . Hypertension   . Hypertensive heart disease   . Myocardial infarction (HCC)    2016  . Peripheral vascular disease (HCC)   . Tobacco  abuse   . Tobacco abuse     Patient Active Problem List   Diagnosis Date Noted  . Chest pain with high risk for cardiac etiology 03/03/2017  . Back pain 06/03/2016  . Essential hypertension, benign 12/25/2015  . AAA (abdominal aortic aneurysm) without rupture (HCC) 12/25/2015  . Tobacco abuse   . Hyperlipidemia   . CAD (coronary artery disease)   . Hypertensive heart disease   . COPD (chronic obstructive pulmonary disease) (HCC)   . Angina pectoris (HCC)   . Coronary artery disease involving native coronary artery of native heart with angina pectoris with documented spasm (HCC)   . Smoker   . NSTEMI (non-ST elevated myocardial infarction) (HCC) 12/07/2014    Past Surgical History:  Procedure Laterality Date  . CARDIAC CATHETERIZATION    . CARDIAC CATHETERIZATION Bilateral 12/08/2014   Procedure: Left Heart Cath and Coronary Angiography;  Surgeon: Iran Ouch, MD;  Location: [facility] INVASIVE CV LAB;  Service: Cardiovascular;  Laterality: Bilateral;  . CARDIAC CATHETERIZATION N/A 12/08/2014   Procedure: Coronary Stent Intervention;  Surgeon: Iran Ouch, MD;  Location: [facility] INVASIVE CV LAB;  Service: Cardiovascular;  Laterality: N/A;  . CORONARY ANGIOPLASTY     stent 2016  . ENDOVASCULAR REPAIR/STENT GRAFT N/A 02/10/2016   Procedure: Endovascular Repair/Stent Graft;  Surgeon: Annice Needy, MD;  Location: [facility] INVASIVE CV LAB;  Service: Cardiovascular;  Laterality: N/A;  . LEFT HEART CATH AND CORONARY ANGIOGRAPHY N/A 03/06/2017   Procedure: LEFT HEART CATH AND CORONARY ANGIOGRAPHY;  Surgeon:  Iran OuchArida, Muhammad A, MD;  Location: [facility] INVASIVE CV LAB;  Service: Cardiovascular;  Laterality: N/A;  . PERIPHERAL VASCULAR CATHETERIZATION Right 01/18/2016   Procedure: Embolization;  Surgeon: Annice NeedyJason S Dew, MD;  Location: [facility] INVASIVE CV LAB;  Service: Cardiovascular;  Laterality: Right;  . SHOULDER SURGERY Left     Prior to Admission medications   Medication Sig Start Date End Date Taking?  Authorizing Provider  aspirin EC 81 MG EC tablet Take 1 tablet (81 mg total) by mouth daily. 12/09/14   Enedina FinnerPatel, Sona, MD  atorvastatin (LIPITOR) 80 MG tablet Take 1 tablet (80 mg total) by mouth daily at 6 PM. 12/09/14   Enedina FinnerPatel, Sona, MD  clopidogrel (PLAVIX) 75 MG tablet TAKE ONE TABLET BY MOUTH ONCE DAILY 08/09/16   Iran OuchArida, Muhammad A, MD  Fluticasone-Salmeterol (ADVAIR DISKUS) 250-50 MCG/DOSE AEPB Inhale 1 puff into the lungs 2 (two) times daily. Rinse mouth after use. 03/21/17 03/21/18  Shane Crutchamachandran, Pradeep, MD  ipratropium-albuterol (DUONEB) 0.5-2.5 (3) MG/3ML SOLN Take 3 mLs by nebulization every 6 (six) hours. 03/06/17   Auburn BilberryPatel, Shreyang, MD  lisinopril-hydrochlorothiazide (ZESTORETIC) 10-12.5 MG tablet Take 1 tablet by mouth daily. 03/21/17   Iran OuchArida, Muhammad A, MD  metoprolol tartrate (LOPRESSOR) 50 MG tablet Take 50 mg by mouth 2 (two) times daily.  04/04/16   [provider]  montelukast (SINGULAIR) 10 MG tablet Take 10 mg by mouth at bedtime.    [provider]  nitroGLYCERIN (NITROSTAT) 0.4 MG SL tablet Place 1 tablet (0.4 mg total) under the tongue every 5 (five) minutes as needed for chest pain. 12/09/14   Antonieta IbaGollan, Timothy J, MD  pantoprazole (PROTONIX) 40 MG tablet Take 1 tablet (40 mg total) by mouth daily. 03/06/17 03/06/18  Auburn BilberryPatel, Shreyang, MD  tamsulosin (FLOMAX) 0.4 MG CAPS capsule Take 1 capsule (0.4 mg total) by mouth daily. 03/14/17   Arnaldo NatalMalinda, Paul F, MD  varenicline (CHANTIX CONTINUING MONTH PAK) 1 MG tablet Take 1 tablet (1 mg total) by mouth 2 (two) times daily. Patient not taking: Reported on 03/21/2017 07/13/16   Iran OuchArida, Muhammad A, MD  varenicline (CHANTIX STARTING MONTH PAK) 0.5 MG X 11 & 1 MG X 42 tablet Take one 0.5 mg tab once daily for 3 days, then increase to one 0.5 mg tab twice daily for 4 days, then increase to one 1 mg tab twice daily. Patient not taking: Reported on 03/21/2017 06/13/16   Iran OuchArida, Muhammad A, MD    No Known Allergies  Family History  Problem  Relation Age of Onset  . Hypertension Mother   . Hypertension Father   . CAD Brother   . CAD Brother   . Stroke Sister   . Stroke Sister     Social History Social History   Tobacco Use  . Smoking status: Current Every Day Smoker    Packs/day: 1.00    Types: Cigarettes  . Smokeless tobacco: Never Used  Substance Use Topics  . Alcohol use: No  . Drug use: No    Review of Systems  Constitutional: Negative for recent vomiting or diarrhea. Eyes: Negative for visual changes. ENT: Negative for sore throat. Cardiovascular: Negative for chest pain. Respiratory: Negative for shortness of breath. Gastrointestinal: Negative for abdominal pain, vomiting and diarrhea. Genitourinary: Negative for dysuria. Musculoskeletal: Negative for back pain. Skin: Negative for rash. Neurological: Negative for headache.  ____________________________________________   PHYSICAL EXAM:  VITAL SIGNS: ED Triage Vitals [03/22/17 1457]  Enc Vitals Group     BP 96/61     Pulse  Rate (!) 113     Resp 18     Temp 98.1 F (36.7 C)     Temp Source Oral     SpO2 100 %     Weight 222 lb (100.7 kg)     Height 6\' 2"  (1.88 m)     Head Circumference      Peak Flow      Pain Score      Pain Loc      Pain Edu?      Excl. in GC?      Constitutional: Alert and oriented. Well appearing and in no distress. HEENT   Head: Normocephalic and atraumatic.      Eyes: Conjunctivae are normal. Pupils equal and round.       Ears:         Nose: No congestion/rhinnorhea.   Mouth/Throat: Mucous membranes are moist.   Neck: No stridor. Cardiovascular/Chest: Tachycardic rate, regular rhythm.  No murmurs, rubs, or gallops. Respiratory: Normal respiratory effort without tachypnea nor retractions. Breath sounds are clear and equal bilaterally. No wheezes/rales/rhonchi. Gastrointestinal: Soft. No distention, no guarding, no rebound. Nontender.    Genitourinary/rectal:Deferred Musculoskeletal: Nontender with  normal range of motion in all extremities. No joint effusions.  No lower extremity tenderness.  Lower extremity edema. Neurologic: No facial droop.  Coordination intact.  Normal speech and language. No gross or focal neurologic deficits are appreciated. Skin:  Skin is warm, dry and intact. No rash noted. Psychiatric: Mood and affect are normal. Speech and behavior are normal. Patient exhibits appropriate insight and judgment.   ____________________________________________  LABS (pertinent positives/negatives) I, Governor Rooks, MD the attending physician have reviewed the labs noted below.  Labs Reviewed  BASIC METABOLIC PANEL - Abnormal; Notable for the following components:      Result Value   Chloride 97 (*)    Glucose, Bld 156 (*)    Creatinine, Ser 1.44 (*)    Calcium 8.3 (*)    GFR calc non Af Amer 46 (*)    GFR calc Af Amer 53 (*)    All other components within normal limits  CBC - Abnormal; Notable for the following components:   Hemoglobin 9.6 (*)    HCT 33.9 (*)    MCV 70.3 (*)    MCH 20.0 (*)    MCHC 28.5 (*)    RDW 23.7 (*)    All other components within normal limits  URINALYSIS, COMPLETE (UACMP) WITH MICROSCOPIC  TROPONIN I  CBG MONITORING, ED    ____________________________________________    EKG I, Governor Rooks, MD, the attending physician have personally viewed and interpreted all ECGs.  114 bpm.  Sinus tachycardia.  First-degree AV block.  Narrow QS.  Normal axis.  Nonspecific ST and T wave ____________________________________________  RADIOLOGY   CT head without contrast: Pending  Chest x-ray portable: Pending __________________________________________  PROCEDURES  Procedure(s) performed: None  Critical Care performed: None   ____________________________________________  ED COURSE / ASSESSMENT AND PLAN  Pertinent labs & imaging results that were available during my care of the patient were reviewed by me and considered in my medical  decision making (see chart for details).    Patient was evaluated upon arrival by EMS and had normal alert mental status with sinus tachycardia.  He is not complaining of any dizziness or neurologic symptoms.  Not complaining of chest pain or breathing problems.  No report of infection or sepsis type symptoms.  Given syncope while sitting, I am somewhat concerned about the  possibility of arrhythmia.  It sound like is probably the need hospital monitoring/observation.  Laboratory studies initiated.  Will add on head CT.  As well as chest x-ray.  Patient is on cardiac pads.  Patient care transferred to Dr. Pershing Proud at shift change 3 PM.  Laboratory studies are all pending as is x-ray and CT.    Patient / Family / Caregiver informed of clinical course, medical decision-making process, and agree with plan.  ___________________________________________   FINAL CLINICAL IMPRESSION(S) / ED DIAGNOSES   Final diagnoses:  Syncope, unspecified syncope type      ___________________________________________        Note: This dictation was prepared with Dragon dictation. Any transcriptional errors that result from this process are unintentional    Governor Rooks, MD 03/22/17 [phone]

## 2017-03-23 ENCOUNTER — Observation Stay: Payer: Medicare Other

## 2017-03-23 DIAGNOSIS — R55 Syncope and collapse: Secondary | ICD-10-CM | POA: Diagnosis not present

## 2017-03-23 DIAGNOSIS — N171 Acute kidney failure with acute cortical necrosis: Secondary | ICD-10-CM | POA: Diagnosis not present

## 2017-03-23 DIAGNOSIS — I472 Ventricular tachycardia: Secondary | ICD-10-CM

## 2017-03-23 DIAGNOSIS — I25118 Atherosclerotic heart disease of native coronary artery with other forms of angina pectoris: Secondary | ICD-10-CM | POA: Diagnosis not present

## 2017-03-23 DIAGNOSIS — I951 Orthostatic hypotension: Secondary | ICD-10-CM

## 2017-03-23 DIAGNOSIS — I9589 Other hypotension: Secondary | ICD-10-CM | POA: Diagnosis not present

## 2017-03-23 LAB — TSH: TSH: 0.582 u[IU]/mL (ref 0.350–4.500)

## 2017-03-23 LAB — TROPONIN I

## 2017-03-23 LAB — ECHOCARDIOGRAM COMPLETE
HEIGHTINCHES: 74 in
Weight: 3536 oz

## 2017-03-23 LAB — BASIC METABOLIC PANEL
ANION GAP: 9 (ref 5–15)
BUN: 15 mg/dL (ref 6–20)
CALCIUM: 8.7 mg/dL — AB (ref 8.9–10.3)
CO2: 30 mmol/L (ref 22–32)
CREATININE: 1.07 mg/dL (ref 0.61–1.24)
Chloride: 97 mmol/L — ABNORMAL LOW (ref 101–111)
GFR calc Af Amer: >60 mL/min (ref >60)
GLUCOSE: 152 mg/dL — AB (ref 65–99)
Potassium: 3.7 mmol/L (ref 3.5–5.1)
Sodium: 136 mmol/L (ref 135–145)

## 2017-03-23 LAB — GLUCOSE, CAPILLARY: Glucose-Capillary: 130 mg/dL — ABNORMAL HIGH (ref 65–99)

## 2017-03-23 LAB — MAGNESIUM: Magnesium: 2.1 mg/dL (ref 1.7–2.4)

## 2017-03-23 MED ORDER — METOPROLOL TARTRATE 50 MG PO TABS
75.0000 mg | ORAL_TABLET | Freq: Two times a day (BID) | ORAL | Status: DC
  Administered 2017-03-23 – 2017-03-24 (×2): 75 mg via ORAL
  Filled 2017-03-23 (×2): qty 1

## 2017-03-23 MED ORDER — POTASSIUM CHLORIDE CRYS ER 20 MEQ PO TBCR
30.0000 meq | EXTENDED_RELEASE_TABLET | Freq: Once | ORAL | Status: AC
  Administered 2017-03-23: 30 meq via ORAL
  Filled 2017-03-23: qty 1

## 2017-03-23 NOTE — Progress Notes (Addendum)
Progress Note  Patient Name: Jacob Ford Date of Encounter: 03/23/2017  Primary Cardiologist: Kirke Corin  Subjective   No further syncope. SCr improved with hydration. No chest pain. Feels weak. 19 beats of NSVT this morning. Anemia persists. Iron low. Orthostatic vitals signs remains positive. To be seen by EP today.   Inpatient Medications    Scheduled Meds: . aspirin EC  81 mg Oral Daily  . atorvastatin  80 mg Oral q1800  . clopidogrel  75 mg Oral Daily  . enoxaparin (LOVENOX) injection  40 mg Subcutaneous Q24H  . ipratropium-albuterol  3 mL Nebulization Q6H  . metoprolol tartrate  50 mg Oral BID  . mometasone-formoterol  2 puff Inhalation BID  . montelukast  10 mg Oral QHS  . pantoprazole  40 mg Oral Daily  . sodium chloride flush  3 mL Intravenous Q12H  . tamsulosin  0.4 mg Oral Daily   Continuous Infusions:  PRN Meds: acetaminophen **OR** acetaminophen, nitroGLYCERIN, ondansetron **OR** ondansetron (ZOFRAN) IV, senna-docusate   Vital Signs    Vitals:   03/22/17 2152 03/23/17 0207 03/23/17 0425 03/23/17 0801  BP:   127/71 131/74  Pulse:   92 89  Resp:   18   Temp:   98 F (36.7 C) 98.5 F (36.9 C)  TempSrc:   Oral Oral  SpO2: 92% 93% 100% 90%  Weight:   220 lb 9.6 oz (100.1 kg)   Height:        Intake/Output Summary (Last 24 hours) at 03/23/2017 0953 Last data filed at 03/23/2017 0800 Gross per 24 hour  Intake -  Output 1350 ml  Net -1350 ml   Filed Weights   03/22/17 1457 03/22/17 1825 03/23/17 0425  Weight: 222 lb (100.7 kg) 221 lb (100.2 kg) 220 lb 9.6 oz (100.1 kg)    Telemetry    NSR, 19 beats of NSVT - Personally Reviewed  ECG    n/a - Personally Reviewed  Physical Exam   GEN: No acute distress.   Neck: No JVD. Cardiac: RRR, no murmurs, rubs, or gallops.  Respiratory: Coarse breath sounds bilaterally.  GI: Soft, nontender, non-distended.   MS: No edema; No deformity. Neuro:  Alert and oriented x 3; Nonfocal.  Psych: Normal  affect.  Labs    Chemistry Recent Labs  Lab 03/22/17 1449 03/23/17 0701  NA 136 136  K 3.5 3.7  CL 97* 97*  CO2 27 30  GLUCOSE 156* 152*  BUN 17 15  CREATININE 1.44* 1.07  CALCIUM 8.3* 8.7*  GFRNONAA 46* >60  GFRAA 53* >60  ANIONGAP 12 9     Hematology Recent Labs  Lab 03/22/17 1449  WBC 8.2  RBC 4.82  HGB 9.6*  HCT 33.9*  MCV 70.3*  MCH 20.0*  MCHC 28.5*  RDW 23.7*  PLT 214    Cardiac Enzymes Recent Labs  Lab 03/22/17 1449 03/22/17 1920 03/22/17 2349 03/23/17 0701  TROPONINI <0.03 <0.03 <0.03 <0.03   No results for input(s): TROPIPOC in the last 168 hours.   BNPNo results for input(s): BNP, PROBNP in the last 168 hours.   DDimer No results for input(s): DDIMER in the last 168 hours.   Radiology    Ct Head Wo Contrast  Result Date: 03/22/2017 IMPRESSION: 1. No acute intracranial abnormality. 2. 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 Electronically Signed   By: Rise Mu M.D.   On: 03/22/2017 15:56   Dg Chest [facility] 1 View  Result  Date: 03/22/2017 IMPRESSION: No active disease. Electronically Signed   By: Lupita RaiderJames  Green Jr, M.D.   On: 03/22/2017 15:38    Cardiac Studies   LHC 03/06/2017: Coronary Findings   Diagnostic  Dominance: Right  Left Main  Vessel is angiographically normal.  Left Anterior Descending  Prox LAD-1 lesion 50% stenosed  Prox LAD-1 lesion is 50% stenosed.  Prox LAD-2 lesion 60% stenosed  Prox LAD-2 lesion is 60% stenosed.  Dist LAD lesion 80% stenosed  Dist LAD lesion is 80% stenosed.  First Diagonal Branch  There is mild disease in the vessel.  Second Diagonal Branch  There is mild disease in the vessel.  Third Diagonal Branch  There is mild disease in the vessel.  Ramus Intermedius  There is mild diffuse disease throughout the vessel.  Left Circumflex  Mid Cx to Dist Cx lesion 50% stenosed  Mid Cx to Dist Cx lesion is 50% stenosed.  First Obtuse Marginal  Branch  Vessel is small in size. The vessel exhibits minimal luminal irregularities.  Second Obtuse Marginal Branch  Vessel is angiographically normal.  Third Obtuse Marginal Branch  Ost 3rd Mrg to 3rd Mrg lesion 50% stenosed  Ost 3rd Mrg to 3rd Mrg lesion is 50% stenosed.  Right Coronary Artery  Prox RCA to Mid RCA lesion 50% stenosed  Prox RCA to Mid RCA lesion is 50% stenosed.  Dist RCA lesion 0% stenosed  Previously placed Dist RCA drug eluting stent is widely patent.  Right Posterior Descending Artery  Vessel is small in size.  Ost RPDA lesion 50% stenosed  Ost RPDA lesion is 50% stenosed.  Right Posterior Atrioventricular Branch  Vessel is small in size.  Post Atrio lesion 60% stenosed  Post Atrio lesion is 60% stenosed.  First Right Posterolateral  1st RPLB lesion 70% stenosed  1st RPLB lesion is 70% stenosed.  Intervention   No interventions have been documented.  Wall Motion              Left Heart   Left Ventricle The left ventricle is mildly dilated. There is mild left ventricular systolic dysfunction. LV end diastolic pressure is mildly elevated. The left ventricular ejection fraction is 50-55% by visual estimate. There are LV function abnormalities due to segmental dysfunction.  Coronary Diagrams   Diagnostic Diagram        Conclusion     Dist LAD lesion is 80% stenosed.  Prox LAD-2 lesion is 60% stenosed.  Prox LAD-1 lesion is 50% stenosed.  Ost 3rd Mrg to 3rd Mrg lesion is 50% stenosed.  Prox RCA to Mid RCA lesion is 50% stenosed.  Ost RPDA lesion is 50% stenosed.  Post Atrio lesion is 60% stenosed.  Previously placed Dist RCA drug eluting stent is widely patent.  Balloon angioplasty was performed.  1st RPLB lesion is 70% stenosed.  Mid Cx to Dist Cx lesion is 50% stenosed.  The left ventricular ejection fraction is 50-55% by visual estimate.  LV end diastolic pressure is mildly elevated.  There is mild left ventricular systolic  dysfunction.   1.  Significant underlying two-vessel coronary artery disease with patent stent in the distal right coronary artery without significant restenosis.  Known moderate proximal to mid LAD disease and significant distal LAD stenosis both unchanged from before. 2. Low normal LV systolic function with an EF of 50-55%.  Mildly to moderately reduced LV EDP at 18-20 mmHg.  Recommendations: I do not see a clear culprit for the patient's symptoms given no significant change  in coronary anatomy from most recent cardiac catheterization.  The patient did have an episode of SVT at the end of cardiac catheterization and was treated with IV metoprolol.  It is possible that some of his symptoms might be related to tachyarrhythmia.  I elected to increase metoprolol to 50 mg twice daily.  Continue medical therapy.  I also added Protonix given that some of his symptoms might be related to GERD. Possible discharge home later today if he remains stable.    TTE pending  Patient Profile     77 y.o. male with history of CAD with NSTEMI in 11/2014 s/p PCI to distal RCA as above with medical management of the LAD with recent [facility] 02/2017 with medical management as above, COPD, AAA s/p endovascular repair, HTN, and HLD who [facility] with syncope.   Assessment & Plan    1. Syncope: -No further episodes -Of uncertain etiology at this time -Possibly in the setting of dehydration with orthostatic hypotension vs VT -EP to see today for further recommendations -Recent LHC 03/06/2017, no need to repeat -Echo pending -Consider carotid ultrasound -Monitor on telemetry -No driving x 6 months  2. CAD of native coronary artery without angina: -Ruled out -Never with chest pain -Recent LHC as above -ASA and Plavix -Aggressive risk factor modification and secondary prevention  3. NSVT: -EP to see today -Magnesium 2.1, potassium 3.7 recommend repletion for goal > 4.0 -Check TSH -Lopressor  4.  AKI: -Improved with hydration  5. Orthostatic hypotension: -Continue gentle hydration  -Cannot rule out as possible etiology of syncope, though he has been noted to have NSVT on telemetry as above  6. Iron deficiency anemia: -Per IM -May need hemoccult   For questions or updates, please contact [facility] Please consult [email or web address] for contact info under Cardiology/STEMI.    Signed, Eula Listen, PA-C [facility] Pager: [phone] 03/23/2017, 9:53 AM   Attending Note Patient seen and examined, agree with detailed note above,  Patient presentation and plan discussed on rounds.   EKG lab work, chest x-ray, echocardiogram reviewed independently by myself  No complaints this morning, very tired Nurses Waking him up with symptoms of apnea By report they had prolonged breath-hold's 1 AM had 19 beat run of VT Other short runs 4-5 beats VT on telemetry on personal review  On clinical exam no significant JVD, lungs moderately decreased with expiratory wheezing, heart sounds regular normal S1-S2 2/6 systolic ejection murmur heard right sternal border, abdomen soft nontender no significant lower extremity edema Alert and oriented  Lab work reviewed showing troponin 0.03 Potassium 3.7 creatinine 1.07 TSH 0.58 iron of 15  1. Syncope: Mixed picture with hypotension, dehydration, anemia on arrival VT overnight 19 beats, by report was asymptomatic, sleep apnea suspected overnight Recent cardiac catheterization, stable disease Normal ejection fraction on echocardiogram, EF estimated 55% Now hydrated, feeling well Blood pressure improved, orthostatics pending -Consider carotid ultrasound -No driving x 6 months EP consultation pending for VT in the setting of syncope known coronary artery disease, moderate LVH  2. CAD of native coronary artery without angina: -Recent LHC , stable disease -ASA and Plavix  3. NSVT: -EP to see today, 19 beat run as well as other 4-5 beat  runs Moderate LVH, known coronary artery disease, possible sleep apnea Recent syncope  4. AKI:  back to baseline by holding lisinopril HCTZ and giving IV fluids  5. Orthostatic hypotension: Would likely hold his lisinopril HCTZ,  consider discharge only on lisinopril 10 mg  with no HCTZ  6. Iron deficiency anemia: -May need hemoccult  Iron low, need for infusion  Case discussed with EP, finding above discussed with patient Greater than 50% was spent in counseling and coordination of care with patient Total encounter time 35 minutes or more   Signed: Dossie Arbour  M.D., Ph.D. [facility]

## 2017-03-23 NOTE — Progress Notes (Signed)
CCMD reports 18 beat run V-tach. Pt asymptomatic. MD Salary made aware. Magnesium added to morning lab draw.

## 2017-03-23 NOTE — Care Management (Signed)
Patient placed in observation for syncope. Previous observation 03/03/2017 for chest pain.  Had 19 beats NSVT and positive orthostatics.  EP to see patient today

## 2017-03-23 NOTE — Consult Note (Addendum)
Cardiology Consultation:   Patient ID: Jacob Ford; [id number]; [birth year]   Admit date: 03/22/2017 Date of Consult: 03/23/2017  Primary Care Provider: Edsel Petrin, DO Primary Cardiologist: Kirke Corin Primary Electrophysiologist:  Graciela Husbands   Patient Profile:   Jacob Ford is a 77 y.o. male with a hx of CAD with NSTEMI in 11/2014 s/p PCI to distal RCA as above with medical management of the LAD with recent [facility] 02/2017 with medical management as above, COPD, AAA s/p endovascular repair, tobacco abuse, HTN, and HLD who is being seen today for the evaluation of syncope with documented NSVT at the request of Dr. Mariah Milling.  History of Present Illness:   [name] had non-ST elevation myocardial infarction November 2016. Cardiac catheterization showed occluded distal RCA with significant distal LAD disease. He underwent angioplasty and drug-eluting stent placement to the distal right coronary artery. Distal LAD disease was treated medically. Nuclear stress test in January 2017 showed no evidence of ischemia. He was recently admitted in late 02/2017 with unstable agnina. Troponin was negative. He underwent LHC that showed significant underlying two-vessel coronary artery disease with patent stent in the distal right coronary artery without significant restenosis.  Moderate proximal to mid LAD disease was stable as well as significant distal LAD stenosis.  These were unchanged from before.  EF was 50-55% with mildly elevated LVEDP.  The patient was noted to have an episode of SVT at the end of cardiac catheterization.  He was also noted to have sinus tachycardia and PVCs on telemetry and thus the dose of metoprolol was increased to 50 mg twice daily. He was seen by Dr. Kirke Corin on 03/21/17 and was feeling well. He was noted to have an elevated heart rate and low BP. He was told to decrease lisinopril/HCTZ in half to 10/12.5 mg daily. Apparently, he did not make this change and continued to take 20/25 mg  daily. He showed up at our office on 3/12 thinking he was told to not eat or drink all day and night, this was not the case. On the morning of 3/13, he was dizzy with positional changes and suffered an episode of syncope. EMS was called and he was noted to be hypotensive with BP in the 90s systolic and tachycardic with heart rates in the 110s bpm. No associated chest pain, palpitations, nausea, vomiting, or diaphoresis. Upon his arrival to [facility] he was noted to be dehydrated with AKI with a SCR of 1.4 and anemic with a HGB of 9.6 (HGB 13.3 in 01/2017). CT head not acute. He was given IV fluids with improvement in BP and renal function. Echo is pending. On the morning of 3/14, he had 19 beats of NSVT, he was asymptomatic. He remains orthostatic. He has ruled out. Echo is pending.     Past Medical History:  Diagnosis Date  . Asthma   . CAD (coronary artery disease)    a. 2002 Cath: Nonobs dzs per patient;  b. 11/2014 NSTEMI/PCI: LM nl, LAD 30p, 60p, 80d, D1/2/3 min irregs, RI min irregs, LCX, 77m, OM1 min irregs, OM2 nl, OM3 50ost, RCA 40p, 100d (2.25x18 Xience Alpine DES), EF 55-65%; c. 01/2015 MV: nl EF, no ischemia.  [name] COPD (chronic obstructive pulmonary disease) (HCC)   . Cough    SMOKER. DENIES FEVER/WHEEZING/ COUGH NONPRODUCTIVE  . Dyspnea    chronic  . Hyperlipidemia   . Hypertension   . Hypertensive heart disease   . Myocardial infarction (HCC)    2016  .  Peripheral vascular disease (HCC)   . Tobacco abuse   . Tobacco abuse     Past Surgical History:  Procedure Laterality Date  . CARDIAC CATHETERIZATION    . CARDIAC CATHETERIZATION Bilateral 12/08/2014   Procedure: Left Heart Cath and Coronary Angiography;  Surgeon: Iran Ouch, MD;  Location: [facility] INVASIVE CV LAB;  Service: Cardiovascular;  Laterality: Bilateral;  . CARDIAC CATHETERIZATION N/A 12/08/2014   Procedure: Coronary Stent Intervention;  Surgeon: Iran Ouch, MD;  Location: [facility] INVASIVE CV LAB;  Service:  Cardiovascular;  Laterality: N/A;  . CORONARY ANGIOPLASTY     stent 2016  . ENDOVASCULAR REPAIR/STENT GRAFT N/A 02/10/2016   Procedure: Endovascular Repair/Stent Graft;  Surgeon: Annice Needy, MD;  Location: [facility] INVASIVE CV LAB;  Service: Cardiovascular;  Laterality: N/A;  . LEFT HEART CATH AND CORONARY ANGIOGRAPHY N/A 03/06/2017   Procedure: LEFT HEART CATH AND CORONARY ANGIOGRAPHY;  Surgeon: Iran Ouch, MD;  Location: [facility] INVASIVE CV LAB;  Service: Cardiovascular;  Laterality: N/A;  . PERIPHERAL VASCULAR CATHETERIZATION Right 01/18/2016   Procedure: Embolization;  Surgeon: Annice Needy, MD;  Location: [facility] INVASIVE CV LAB;  Service: Cardiovascular;  Laterality: Right;  . SHOULDER SURGERY Left      Home Meds: Prior to Admission medications   Medication Sig Start Date End Date Taking? Authorizing Provider  aspirin EC 81 MG EC tablet Take 1 tablet (81 mg total) by mouth daily. 12/09/14  Yes Enedina Finner, MD  atorvastatin (LIPITOR) 80 MG tablet Take 1 tablet (80 mg total) by mouth daily at 6 PM. 12/09/14  Yes Enedina Finner, MD  clopidogrel (PLAVIX) 75 MG tablet TAKE ONE TABLET BY MOUTH ONCE DAILY 08/09/16  Yes Iran Ouch, MD  Fluticasone-Salmeterol (ADVAIR DISKUS) 250-50 MCG/DOSE AEPB Inhale 1 puff into the lungs 2 (two) times daily. Rinse mouth after use. 03/21/17 03/21/18 Yes Shane Crutch, MD  lisinopril-hydrochlorothiazide (ZESTORETIC) 10-12.5 MG tablet Take 1 tablet by mouth daily. 03/21/17  Yes Iran Ouch, MD  metoprolol tartrate (LOPRESSOR) 50 MG tablet Take 50 mg by mouth 2 (two) times daily.  04/04/16  Yes [provider]  montelukast (SINGULAIR) 10 MG tablet Take 10 mg by mouth at bedtime.   Yes [provider]  pantoprazole (PROTONIX) 40 MG tablet Take 1 tablet (40 mg total) by mouth daily. 03/06/17 03/06/18 Yes Auburn Bilberry, MD  tamsulosin (FLOMAX) 0.4 MG CAPS capsule Take 1 capsule (0.4 mg total) by mouth daily. 03/14/17  Yes Arnaldo Natal, MD    ipratropium-albuterol (DUONEB) 0.5-2.5 (3) MG/3ML SOLN Take 3 mLs by nebulization every 6 (six) hours. 03/06/17   Auburn Bilberry, MD  nitroGLYCERIN (NITROSTAT) 0.4 MG SL tablet Place 1 tablet (0.4 mg total) under the tongue every 5 (five) minutes as needed for chest pain. 12/09/14   Antonieta Iba, MD  varenicline (CHANTIX CONTINUING MONTH PAK) 1 MG tablet Take 1 tablet (1 mg total) by mouth 2 (two) times daily. Patient not taking: Reported on 03/21/2017 07/13/16   Iran Ouch, MD  varenicline (CHANTIX STARTING MONTH PAK) 0.5 MG X 11 & 1 MG X 42 tablet Take one 0.5 mg tab once daily for 3 days, then increase to one 0.5 mg tab twice daily for 4 days, then increase to one 1 mg tab twice daily. Patient not taking: Reported on 03/21/2017 06/13/16   Iran Ouch, MD    Inpatient Medications: Scheduled Meds: . aspirin EC  81 mg Oral Daily  . atorvastatin  80 mg Oral q1800  .  clopidogrel  75 mg Oral Daily  . enoxaparin (LOVENOX) injection  40 mg Subcutaneous Q24H  . ipratropium-albuterol  3 mL Nebulization Q6H  . metoprolol tartrate  50 mg Oral BID  . mometasone-formoterol  2 puff Inhalation BID  . montelukast  10 mg Oral QHS  . pantoprazole  40 mg Oral Daily  . potassium chloride  30 mEq Oral Once  . sodium chloride flush  3 mL Intravenous Q12H  . tamsulosin  0.4 mg Oral Daily   Continuous Infusions:  PRN Meds: acetaminophen **OR** acetaminophen, nitroGLYCERIN, ondansetron **OR** ondansetron (ZOFRAN) IV, senna-docusate  Allergies:  No Known Allergies  Social History:   Social History   Socioeconomic History  . Marital status: Divorced    Spouse name: Not on file  . Number of children: Not on file  . Years of education: Not on file  . Highest education level: Not on file  Social Needs  . Financial resource strain: Not on file  . Food insecurity - worry: Not on file  . Food insecurity - inability: Not on file  . Transportation needs - medical: Not on file  .  Transportation needs - non-medical: Not on file  Occupational History  . Occupation: retired  Tobacco Use  . Smoking status: Current Every Day Smoker    Packs/day: 1.00    Types: Cigarettes  . Smokeless tobacco: Never Used  Substance and Sexual Activity  . Alcohol use: No  . Drug use: No  . Sexual activity: No  Other Topics Concern  . Not on file  Social History Narrative  . Not on file     Family History:   Family History  Problem Relation Age of Onset  . Hypertension Mother   . Hypertension Father   . CAD Brother   . CAD Brother   . Stroke Sister   . Stroke Sister     ROS:  Review of Systems  Constitutional: Positive for malaise/fatigue. Negative for chills, diaphoresis, fever and weight loss.  HENT: Negative for congestion.   Eyes: Negative for discharge and redness.  Respiratory: Negative for cough, hemoptysis, sputum production, shortness of breath and wheezing.   Cardiovascular: Negative for chest pain, palpitations, orthopnea, claudication, leg swelling and PND.  Gastrointestinal: Negative for abdominal pain, blood in stool, heartburn, melena, nausea and vomiting.  Genitourinary: Negative for hematuria.  Musculoskeletal: Positive for falls. Negative for myalgias.  Skin: Negative for rash.  Neurological: Positive for loss of consciousness and weakness. Negative for dizziness, tingling, tremors, sensory change, speech change and focal weakness.  Endo/Heme/Allergies: Does not bruise/bleed easily.  Psychiatric/Behavioral: Negative for substance abuse. The patient is not nervous/anxious.   All other systems reviewed and are negative.     Physical Exam/Data:   Vitals:   03/22/17 2152 03/23/17 0207 03/23/17 0425 03/23/17 0801  BP:   127/71 131/74  Pulse:   92 89  Resp:   18   Temp:   98 F (36.7 C) 98.5 F (36.9 C)  TempSrc:   Oral Oral  SpO2: 92% 93% 100% 90%  Weight:   220 lb 9.6 oz (100.1 kg)   Height:        Intake/Output Summary (Last 24 hours) at  03/23/2017 1003 Last data filed at 03/23/2017 0800 Gross per 24 hour  Intake -  Output 1350 ml  Net -1350 ml   Filed Weights   03/22/17 1457 03/22/17 1825 03/23/17 0425  Weight: 222 lb (100.7 kg) 221 lb (100.2 kg) 220 lb 9.6 oz (100.1 kg)  Body mass index is 28.32 kg/m.   Physical Exam: General: Well developed, well nourished, in no acute distress. Head: Normocephalic, atraumatic, sclera non-icteric, no xanthomas, nares without discharge.  Neck: Negative for carotid bruits. JVD not elevated. Lungs: Coarse breath sounds bilaterally. Breathing is unlabored. Heart: RRR with S1 S2. No murmurs, rubs, or gallops appreciated. Abdomen: Soft, non-tender, non-distended with normoactive bowel sounds. No hepatomegaly. No rebound/guarding. No obvious abdominal masses. Msk:  Strength and tone appear normal for age. Extremities: No clubbing or cyanosis. No edema. Distal pedal pulses are 2+ and equal bilaterally. Neuro: Alert and oriented X 3. No facial asymmetry. No focal deficit. Moves all extremities spontaneously. Psych:  Responds to questions appropriately with a normal affect.   EKG:  The EKG was personally reviewed and demonstrates: Sinus tachycardia rate 114 bpm nonspecific ST abnormality first-degree AV block Telemetry:  Telemetry was personally reviewed and demonstrates: NSR, 19 beats of NSVT   Weights: Filed Weights   03/22/17 1457 03/22/17 1825 03/23/17 0425  Weight: 222 lb (100.7 kg) 221 lb (100.2 kg) 220 lb 9.6 oz (100.1 kg)    Relevant CV Studies: LHC 03/06/2017: Coronary Findings   Diagnostic  Dominance: Right  Left Main  Vessel is angiographically normal.  Left Anterior Descending  Prox LAD-1 lesion 50% stenosed  Prox LAD-1 lesion is 50% stenosed.  Prox LAD-2 lesion 60% stenosed  Prox LAD-2 lesion is 60% stenosed.  Dist LAD lesion 80% stenosed  Dist LAD lesion is 80% stenosed.  First Diagonal Branch  There is mild disease in the vessel.  Second Diagonal Branch    There is mild disease in the vessel.  Third Diagonal Branch  There is mild disease in the vessel.  Ramus Intermedius  There is mild diffuse disease throughout the vessel.  Left Circumflex  Mid Cx to Dist Cx lesion 50% stenosed  Mid Cx to Dist Cx lesion is 50% stenosed.  First Obtuse Marginal Branch  Vessel is small in size. The vessel exhibits minimal luminal irregularities.  Second Obtuse Marginal Branch  Vessel is angiographically normal.  Third Obtuse Marginal Branch  Ost 3rd Mrg to 3rd Mrg lesion 50% stenosed  Ost 3rd Mrg to 3rd Mrg lesion is 50% stenosed.  Right Coronary Artery  Prox RCA to Mid RCA lesion 50% stenosed  Prox RCA to Mid RCA lesion is 50% stenosed.  Dist RCA lesion 0% stenosed  Previously placed Dist RCA drug eluting stent is widely patent.  Right Posterior Descending Artery  Vessel is small in size.  Ost RPDA lesion 50% stenosed  Ost RPDA lesion is 50% stenosed.  Right Posterior Atrioventricular Branch  Vessel is small in size.  Post Atrio lesion 60% stenosed  Post Atrio lesion is 60% stenosed.  First Right Posterolateral  1st RPLB lesion 70% stenosed  1st RPLB lesion is 70% stenosed.  Intervention   No interventions have been documented.  Wall Motion              Left Heart   Left Ventricle The left ventricle is mildly dilated. There is mild left ventricular systolic dysfunction. LV end diastolic pressure is mildly elevated. The left ventricular ejection fraction is 50-55% by visual estimate. There are LV function abnormalities due to segmental dysfunction.  Coronary Diagrams   Diagnostic Diagram        Conclusion     Dist LAD lesion is 80% stenosed.  Prox LAD-2 lesion is 60% stenosed.  Prox LAD-1 lesion is 50% stenosed.  Ost 3rd Mrg to 3rd Mrg  lesion is 50% stenosed.  Prox RCA to Mid RCA lesion is 50% stenosed.  Ost RPDA lesion is 50% stenosed.  Post Atrio lesion is 60% stenosed.  Previously placed Dist RCA drug  eluting stent is widely patent.  Balloon angioplasty was performed.  1st RPLB lesion is 70% stenosed.  Mid Cx to Dist Cx lesion is 50% stenosed.  The left ventricular ejection fraction is 50-55% by visual estimate.  LV end diastolic pressure is mildly elevated.  There is mild left ventricular systolic dysfunction.  1. Significant underlying two-vessel coronary artery disease with patent stent in the distal right coronary artery without significant restenosis. Known moderate proximal to mid LAD disease and significant distal LAD stenosis both unchanged from before. 2. Low normal LV systolic function with an EF of 50-55%. Mildly to moderately reduced LV EDP at 18-20 mmHg.  Recommendations: I do not see a clear culprit for the patient's symptoms given no significant change in coronary anatomy from most recent cardiac catheterization. The patient did have an episode of SVT at the end of cardiac catheterization and was treated with IV metoprolol. It is possible that some of his symptoms might be related to tachyarrhythmia. I elected to increase metoprolol to 50 mg twice daily. Continue medical therapy. I also added Protonix given that some of his symptoms might be related to GERD. Possible discharge home later today if he remains stable.    TTE pending   Laboratory Data:  Chemistry Recent Labs  Lab 03/22/17 1449 03/23/17 0701  NA 136 136  K 3.5 3.7  CL 97* 97*  CO2 27 30  GLUCOSE 156* 152*  BUN 17 15  CREATININE 1.44* 1.07  CALCIUM 8.3* 8.7*  GFRNONAA 46* >60  GFRAA 53* >60  ANIONGAP 12 9    No results for input(s): PROT, ALBUMIN, AST, ALT, ALKPHOS, BILITOT in the last 168 hours. Hematology Recent Labs  Lab 03/22/17 1449  WBC 8.2  RBC 4.82  HGB 9.6*  HCT 33.9*  MCV 70.3*  MCH 20.0*  MCHC 28.5*  RDW 23.7*  PLT 214   Cardiac Enzymes Recent Labs  Lab 03/22/17 1449 03/22/17 1920 03/22/17 2349 03/23/17 0701  TROPONINI <0.03 <0.03 <0.03 <0.03   No  results for input(s): TROPIPOC in the last 168 hours.  BNPNo results for input(s): BNP, PROBNP in the last 168 hours.  DDimer No results for input(s): DDIMER in the last 168 hours.  Radiology/Studies:  Ct Head Wo Contrast  Result Date: 03/22/2017 IMPRESSION: 1. No acute intracranial abnormality. 2. 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 Electronically Signed   By: Rise Mu M.D.   On: 03/22/2017 15:56   Dg Chest Port 1 View  Result Date: 03/22/2017 IMPRESSION: No active disease. Electronically Signed   By: Lupita Raider, M.D.   On: 03/22/2017 15:38    Assessment and Plan:   1. Syncope: -No further episodes -Of uncertain etiology at this time -Possibly in the setting of dehydration with orthostatic hypotension vs VT -EP to see today for further recommendations -Recent LHC 03/06/2017, no need to repeat -Echo pending -Consider carotid ultrasound -Monitor on telemetry -No driving x 6 months  2. CAD of native coronary artery without angina: -Ruled out -Never with chest pain -Recent LHC as above -ASA and Plavix -Aggressive risk factor modification and secondary prevention  3. NSVT: -EP MD to see today for further recommendations  -Magnesium 2.1, potassium 3.7 recommend repletion for goal > 4.0 -Check TSH -Lopressor  4. AKI: -Improved with hydration  5. Orthostatic hypotension: -Continue gentle hydration  -Cannot rule out as possible etiology of syncope, though he has been noted to have NSVT on telemetry as above  6. Iron deficiency anemia: -Per IM -May need hemoccult     For questions or updates, please contact [facility] Please consult [email or web address] for contact info under Cardiology/STEMI.   Signed, Eula Listen, PA-C [facility] Pager: [phone] 03/23/2017, 10:03 AM  Patient seen and examined.  Syncope occurred with protracted prodrome.  He became hot, diaphoretic and nauseated standing  in the garage watching his son's work.  He sat on the tailgate of the car as dizziness worsened.  He lost consciousness.  He is unaware of how long he was unconscious.  He has had no prior syncope.  He has mild orthostatic lightheadedness.  He is taking no new medicines  His chart describes orthostatic hypotension and dehydration but his labs and his vital signs do not reflect this orthostatic;   Physical examination is as noted above   Syncope-isolated with protracted prodrome  Ventricular tachycardia-nonsustained  SVT-likely "atrial tachycardia  Orthostatic hypotension  Hypertension with hypertensive heart disease  Coronary artery disease with prior stenting with normal LV function   The episode of syncope strongly suggest a vasomotor mechanism.  He presented with a supraventricular tachycardia, likely an atrial tachycardia which the ECG read reasonably, i.e. not being sure.  This could have triggered a vasomotor episode.  In this regard, the patient was unaware of his tachycardia.  Nonsustained ventricular tachycardia can also be a potential trigger.  With his LV function normal, it is unlikely that sustained ventricular tachycardia is a potential life-threatening culprit  Importance of nonsustained ventricular tachycardia is largely as to stratify her risk for sudden cardiac death.  With a normal LV function this is of low likelihood.  Hence, would recommend medical therapy.  Increase his beta-blocker from 50 twice daily--100 twice daily.  To see him again as needed.  If you were to have recurrent syncope likelihood of which is about 20-40%, an event recorder  may be of use

## 2017-03-23 NOTE — Progress Notes (Signed)
Discussed with Dr. Graciela HusbandsKlein. His note to follow on the EP consult. In short, it appear his initial rhythm was SVT. Episode of syncope appears to be neurally mediated. Her his SVT and NSVT we will increase his Lopressor to 75 mg bid.

## 2017-03-23 NOTE — Care Management Obs Status (Signed)
MEDICARE OBSERVATION STATUS NOTIFICATION   Patient Details  Name: Jacob Ford MRN: [id number] Date of Birth: 1940/08/10   Medicare Observation Status Notification Given:  Yes  Patient declined to sign notice saying he has signed too many forms already.  Explained the notice. Declined to receive the copy of the notice but it was given to him anyway Eber HongGreene, Mischele Detter R, RN 03/23/2017, 9:12 AM

## 2017-03-23 NOTE — Progress Notes (Addendum)
[employer] [facility] - [place] at [facility]                                                                                                                                                                                  Patient Demographics   Jacob Ford, is a 77 y.o. male, DOB - 08-13-1940, ZOX:[id number]  Admit date - 03/22/2017   Admitting Physician Ihor Austin, MD  Outpatient Primary MD for the patient is Edsel Petrin, DO   LOS - 0  Subjective: Patient presented with syncope and collapse is noted to have ventricular tachycardia overnight cardiology has seen the patient    Review of Systems:   CONSTITUTIONAL: No documented fever. No fatigue, weakness. No weight gain, no weight loss.  EYES: No blurry or double vision.  ENT: No tinnitus. No postnasal drip. No redness of the oropharynx.  RESPIRATORY: No cough, no wheeze, no hemoptysis.  Chronic dyspnea.  CARDIOVASCULAR: No chest pain. No orthopnea. No palpitations. No syncope.  GASTROINTESTINAL: No nausea, no vomiting or diarrhea. No abdominal pain. No melena or hematochezia.  GENITOURINARY: No dysuria or hematuria.  ENDOCRINE: No polyuria or nocturia. No heat or cold intolerance.  HEMATOLOGY: No anemia. No bruising. No bleeding.  INTEGUMENTARY: No rashes. No lesions.  MUSCULOSKELETAL: No arthritis. No swelling. No gout.  NEUROLOGIC: No numbness, tingling, or ataxia. No seizure-type activity.  PSYCHIATRIC: No anxiety. No insomnia. No ADD.    Vitals:   Vitals:   03/22/17 2152 03/23/17 0207 03/23/17 0425 03/23/17 0801  BP:   127/71 131/74  Pulse:   92 89  Resp:   18   Temp:   98 F (36.7 C) 98.5 F (36.9 C)  TempSrc:   Oral Oral  SpO2: 92% 93% 100% 90%  Weight:   220 lb 9.6 oz (100.1 kg)   Height:        Wt Readings from Last 3 Encounters:  03/23/17 220 lb 9.6 oz (100.1 kg)  03/21/17 224 lb 8 oz (101.8 kg)  03/14/17 227 lb (103 kg)     Intake/Output Summary (Last 24 hours) at 03/23/2017  1516 Last data filed at 03/23/2017 1356 Gross per 24 hour  Intake 480 ml  Output 2525 ml  Net -2045 ml    Physical Exam:   GENERAL: Pleasant-appearing in no apparent distress.  HEAD, EYES, EARS, NOSE AND THROAT: Atraumatic, normocephalic. Extraocular muscles are intact. Pupils equal and reactive to light. Sclerae anicteric. No conjunctival injection. No oro-pharyngeal erythema.  NECK: Supple. There is no jugular venous distention. No bruits, no lymphadenopathy, no thyromegaly.  HEART: Regular rate and rhythm,. No murmurs, no rubs, no clicks.  LUNGS: Bilateral wheezing throughout both lungs which  is chronic for him ABDOMEN: Soft, flat, nontender, nondistended. Has good bowel sounds. No hepatosplenomegaly appreciated.  EXTREMITIES: No evidence of any cyanosis, clubbing, or peripheral edema.  +2 pedal and radial pulses bilaterally.  NEUROLOGIC: The patient is alert, awake, and oriented x3 with no focal motor or sensory deficits appreciated bilaterally.  SKIN: Moist and warm with no rashes appreciated.  Psych: Not anxious, depressed LN: No inguinal LN enlargement    Antibiotics   Anti-infectives (From admission, onward)   None      Medications   Scheduled Meds: . aspirin EC  81 mg Oral Daily  . atorvastatin  80 mg Oral q1800  . clopidogrel  75 mg Oral Daily  . enoxaparin (LOVENOX) injection  40 mg Subcutaneous Q24H  . ipratropium-albuterol  3 mL Nebulization Q6H  . metoprolol tartrate  75 mg Oral BID  . mometasone-formoterol  2 puff Inhalation BID  . montelukast  10 mg Oral QHS  . pantoprazole  40 mg Oral Daily  . sodium chloride flush  3 mL Intravenous Q12H  . tamsulosin  0.4 mg Oral Daily   Continuous Infusions: PRN Meds:.acetaminophen **OR** acetaminophen, nitroGLYCERIN, ondansetron **OR** ondansetron (ZOFRAN) IV, senna-docusate   Data Review:   Micro Results No results found for this or any previous visit (from the past 240 hour(s)).  Radiology Reports Dg Chest 2  View  Result Date: 03/03/2017 CLINICAL DATA:  Chest pain and fatigue for 4 days. Cough. Shortness of breath. Smoker. EXAM: CHEST  2 VIEW COMPARISON:  12/05/2014 chest radiographs and CT FINDINGS: The cardiac silhouette is slightly enlarged. Aortic atherosclerosis is noted. Mild prominence of the central pulmonary arteries is unchanged. There is slight pleural thickening along the right minor fissure. No confluent airspace opacity, overt pulmonary edema, pleural effusion, or pneumothorax is identified. No acute osseous abnormality is seen. IMPRESSION: No active cardiopulmonary disease. Electronically Signed   By: Sebastian Ache M.D.   On: 03/03/2017 11:08   Ct Head Wo Contrast  Result Date: 03/22/2017 CLINICAL DATA:  Initial evaluation for acute syncope. EXAM: CT HEAD WITHOUT CONTRAST TECHNIQUE: Contiguous axial images were obtained from the base of the skull through the vertex without intravenous contrast. COMPARISON:  Prior CT from 12/11/2012 FINDINGS: Brain: Age-appropriate cerebral volume. Mild chronic small vessel ischemic disease. Small remote right parietal infarct noted. No acute intracranial hemorrhage. No acute large vessel territory infarct. No mass lesion, midline shift or mass effect. No hydrocephalus. No extra-axial fluid collection. Vascular: No hyperdense vessel.Calcified atherosclerosis present at the skull base. Skull: Focal soft tissue scarring noted at the right parietal scalp. No acute scalp soft tissue abnormality. Calvarium intact. Sinuses/Orbits: Globes normal soft tissues demonstrate no acute abnormality. Mild scattered mucosal thickening within the ethmoidal air cells and maxillary sinuses. Paranasal sinuses are otherwise clear. No mastoid effusion. Other: None. IMPRESSION: 1. No acute intracranial abnormality. 2. 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 Electronically Signed   By: Rise Mu M.D.   On: 03/22/2017 15:56   Nm Myocar  Multi W/spect W/wall Motion / Ef  Result Date: 03/04/2017 Pharmacological myocardial perfusion imaging study with ischemia noted in the mid to apical inferior and inferolateral wall There is a small region of mild fixed defect on resting attenuation corrected images in the periapical inferolateral wall. Non-attenuation corrected images with large predominantly fixed inferior and inferolateral wall defect, mild ischemia noted. Inferior wall hypokinesis noted, EF estimated at 56% No EKG changes concerning for ischemia at peak stress or in recovery. High  risk scan Compared to previous study dated 01/2015, there is now large perfusion defect inferior and inferolateral region. Prior cardiac cath 12/2014 Signed, Dossie Arbourim Gollan, MD, Ph.D [facility]   [place]  Result Date: 03/22/2017 CLINICAL DATA:  Syncope. EXAM: PORTABLE CHEST 1 VIEW COMPARISON:  Radiographs of March 03, 2017. FINDINGS: Stable cardiomediastinal silhouette. No pneumothorax or pleural effusion is noted. Both lungs are clear. The visualized skeletal structures are unremarkable. IMPRESSION: No active disease. Electronically Signed   By: Lupita RaiderJames  Green Jr, M.D.   On: 03/22/2017 15:38     CBC Recent Labs  Lab 03/22/17 1449  WBC 8.2  HGB 9.6*  HCT 33.9*  PLT 214  MCV 70.3*  MCH 20.0*  MCHC 28.5*  RDW 23.7*    Chemistries  Recent Labs  Lab 03/22/17 1449 03/23/17 0701  NA 136 136  K 3.5 3.7  CL 97* 97*  CO2 27 30  GLUCOSE 156* 152*  BUN 17 15  CREATININE 1.44* 1.07  CALCIUM 8.3* 8.7*  MG  --  2.1   ------------------------------------------------------------------------------------------------------------------ estimated creatinine clearance is 74.3 mL/min (by C-G formula based on SCr of 1.07 mg/dL). ------------------------------------------------------------------------------------------------------------------ No results for input(s): HGBA1C in the last 72  hours. ------------------------------------------------------------------------------------------------------------------ No results for input(s): CHOL, HDL, LDLCALC, TRIG, CHOLHDL, LDLDIRECT in the last 72 hours. ------------------------------------------------------------------------------------------------------------------ Recent Labs    03/23/17 0701  TSH 0.582   ------------------------------------------------------------------------------------------------------------------ Recent Labs    03/22/17 1920  FERRITIN 40  TIBC 402  IRON 15*    Coagulation profile No results for input(s): INR, PROTIME in the last 168 hours.  No results for input(s): DDIMER in the last 72 hours.  Cardiac Enzymes Recent Labs  Lab 03/22/17 1920 03/22/17 2349 03/23/17 0701  TROPONINI <0.03 <0.03 <0.03   ------------------------------------------------------------------------------------------------------------------ Invalid input(s): POCBNP    Assessment & Plan  77 year old male patient with history of coronary disease, cardiac stent, hypertension, hyperlipidemia, peripheral vascular disease, tobacco abuse presented to the emergency room after passing out.  1.  Syncope and collapse Possibly due to ventricular tachycardia Cardiology asked ep to see Carotid dopplers  2 coronary artery disease with history of stent. Resume aspirin and Plavix Restart beta-blocker and ACE inhibitor  3.  Peripheral vascular disease Continue aspirin  4.  copd continue home inhalers and treatment  5.Hypertension Continue ACE inhibitor  6. Smoking cessation - 4min spent discussed with patient regarding stopping smoking, nicotine patch offered pt dosent want to stop smoking      Code Status Orders  (From admission, onward)        Start     Ordered   03/22/17 1809  Full code  Continuous     03/22/17 1809    Code Status History    Date Active Date Inactive Code Status Order ID Comments User  Context   03/03/2017 14:41 03/06/2017 23:33 Full Code [id number]  Milagros LollSudini, Srikar, MD ED   02/10/2016 12:27 02/11/2016 21:38 Full Code [id number]  Annice Needyew, Jason S, MD Inpatient   01/18/2016 11:20 01/18/2016 16:16 Full Code [id number]  Annice Needyew, Jason S, MD Inpatient   12/08/2014 10:02 12/09/2014 20:53 Full Code [id number]  Iran OuchArida, Muhammad A, MD Inpatient   12/07/2014 20:22 12/08/2014 10:02 Full Code [id number]  Adrian SaranMody, Sital, MD Inpatient           Consults cards   DVT Prophylaxis  Lovenox   Lab Results  Component Value Date   PLT 214 03/22/2017     Time Spent in minutes  35min  Greater than 50%  of time spent in care coordination and counseling patient regarding the condition and plan of care.   Auburn Bilberry M.D on 03/23/2017 at 3:16 PM  Between 7am to 6pm - Pager - [phone]  After 6pm go to [email or web address] - Social research officer, government  [employer]   Office  [phone]

## 2017-03-24 LAB — GLUCOSE, CAPILLARY
Glucose-Capillary: 162 mg/dL — ABNORMAL HIGH (ref 65–99)
Glucose-Capillary: 158 mg/dL — ABNORMAL HIGH (ref 65–99)

## 2017-03-24 MED ORDER — METOPROLOL TARTRATE 75 MG PO TABS: 75.0000 mg | ORAL_TABLET | Freq: Two times a day (BID) | ORAL | Status: DC

## 2017-03-24 NOTE — Progress Notes (Signed)
CCMD reports 2.1 sec pause. MD Sherryll BurgerShah made aware. No new orders Pt asymptomatic.

## 2017-03-24 NOTE — Progress Notes (Signed)
Discharged to home.  His son is driving him.  Stressed that he use the med list from today and discard all prior discharge instruction sheets.

## 2017-03-24 NOTE — Progress Notes (Signed)
Pt refused bed alarm. Pt states he will call if he needs to get out of bed.

## 2017-03-24 NOTE — Plan of Care (Signed)
Discharge instructions regarding medications, follow up, instructions and AVS information

## 2017-03-24 NOTE — Discharge Summary (Signed)
[facility] - [place] at [facility], 77 y.o., DOB Jun 21, 1940, MRN [id number]. 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  Patient was noted to be dehydrated and hypotensive.  He was given IV fluids.  Patient was monitored on telemetry.  Telemetry showed ventricular tachycardia therefore was seen by electro physiology who recommended increasing his metoprolol since his EF was preserved.  Patient also was seen by cardiology.  He likely has severe sleep apnea he does have an appointment to see a pulmonologist according to him next week at that time sleep study needs to be addressed.  He continues to smoke I strongly recommended he stop smoking              Consults  cardiology  Significant Tests:  See full reports for all details     Dg Chest 2 View  Result Date: 03/03/2017 CLINICAL DATA:  Chest pain and fatigue for 4 days. Cough. Shortness of breath. Smoker. EXAM: CHEST  2 VIEW COMPARISON:  12/05/2014 chest radiographs and CT FINDINGS: The cardiac silhouette is slightly enlarged. Aortic atherosclerosis is noted. Mild prominence of the central pulmonary arteries is unchanged. There is slight pleural thickening along the right minor fissure. No confluent airspace opacity, overt pulmonary edema, pleural effusion, or pneumothorax is  identified. No acute osseous abnormality is seen. IMPRESSION: No active cardiopulmonary disease. Electronically Signed   By: Sebastian Ache M.D.   On: 03/03/2017 11:08   Ct Head Wo Contrast  Result Date: 03/22/2017 CLINICAL DATA:  Initial evaluation for acute syncope. EXAM: CT HEAD WITHOUT CONTRAST TECHNIQUE: Contiguous axial images were obtained from the base of the skull through the vertex without intravenous contrast. COMPARISON:  Prior CT from 12/11/2012 FINDINGS: Brain: Age-appropriate cerebral volume. Mild chronic small vessel ischemic disease. Small remote right parietal infarct noted. No acute intracranial hemorrhage. No acute large vessel territory infarct. No mass lesion, midline shift or mass effect. No hydrocephalus. No extra-axial fluid collection. Vascular: No hyperdense vessel.Calcified atherosclerosis present at the skull base. Skull: Focal soft tissue scarring noted at the right parietal scalp. No acute scalp soft tissue abnormality. Calvarium intact. Sinuses/Orbits: Globes normal soft tissues demonstrate no acute abnormality. Mild scattered mucosal thickening within the ethmoidal air cells and maxillary sinuses. Paranasal sinuses are otherwise clear. No mastoid effusion. Other: None. IMPRESSION: 1. No acute intracranial abnormality. 2. 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 Electronically Signed   By: Rise Mu M.D.   On: 03/22/2017 15:56   US Carotid Bilateral  Result Date: 03/24/2017 CLINICAL DATA:  Syncope and collapse. History of CAD (post myocardial infarction and coronary stent placement), hypertension, smoking and hyperlipidemia. EXAM: BILATERAL CAROTID DUPLEX ULTRASOUND TECHNIQUE: Wallace Cullens scale imaging, color Doppler and duplex ultrasound were performed of bilateral carotid and vertebral arteries in the neck. COMPARISON:  None. FINDINGS: Criteria: Quantification of carotid stenosis is based on velocity parameters that correlate the  residual internal carotid diameter with NASCET-based stenosis levels, using the diameter of the distal internal carotid lumen  as the denominator for stenosis measurement. The following velocity measurements were obtained: RIGHT ICA:  71/33 cm/sec CCA:  77/23 cm/sec SYSTOLIC ICA/CCA RATIO:  0.9 DIASTOLIC ICA/CCA RATIO:  0.5 ECA:  193 cm/sec LEFT ICA:  106/47 cm/sec CCA:  80/21 cm/sec SYSTOLIC ICA/CCA RATIO:  1 3 DIASTOLIC ICA/CCA RATIO:  2.3 ECA:  109 cm/sec RIGHT CAROTID ARTERY: There is a minimal amount echogenic plaque within the right carotid bulb. There is a minimal moderate amount echogenic partially shadowing plaque involving the origin and proximal aspects of the right internal carotid artery (image 25), not resulting in elevated peak systolic velocities within the interrogated course the right internal carotid artery to suggest a hemodynamically significant stenosis. RIGHT VERTEBRAL ARTERY:  Antegrade flow LEFT CAROTID ARTERY: Intimal thickening throughout the left common carotid artery. There is a moderate to large amount echogenic atherosclerotic plaque within the left carotid bulb (image 50 and 52), extending to involve the origin proximal aspect the left internal carotid artery (image 60), not resulting in elevated peak systolic velocities within the interrogated course the left internal carotid artery to suggest a hemodynamically significant stenosis. LEFT VERTEBRAL ARTERY:  Antegrade flow IMPRESSION: Moderate to large amount of bilateral atherosclerotic plaque, left subjectively greater than right, not resulting in a hemodynamically significant stenosis within either internal artery. Electronically Signed   By: Simonne ComeJohn  Watts M.D.   On: 03/24/2017 07:28   Nm Myocar Multi W/spect W/wall Motion / Ef  Result Date: 03/04/2017 Pharmacological myocardial perfusion imaging study with ischemia noted in the mid to apical inferior and inferolateral wall There is a small region of mild fixed defect on resting  attenuation corrected images in the periapical inferolateral wall. Non-attenuation corrected images with large predominantly fixed inferior and inferolateral wall defect, mild ischemia noted. Inferior wall hypokinesis noted, EF estimated at 56% No EKG changes concerning for ischemia at peak stress or in recovery. High risk scan Compared to previous study dated 01/2015, there is now large perfusion defect inferior and inferolateral region. Prior cardiac cath 12/2014 Signed, Dossie Arbourim Gollan, MD, Ph.D [facility]   Dg Chest [place]  Result Date: 03/22/2017 CLINICAL DATA:  Syncope. EXAM: PORTABLE CHEST 1 VIEW COMPARISON:  Radiographs of March 03, 2017. FINDINGS: Stable cardiomediastinal silhouette. No pneumothorax or pleural effusion is noted. Both lungs are clear. The visualized skeletal structures are unremarkable. IMPRESSION: No active disease. Electronically Signed   By: Lupita RaiderJames  Green Jr, M.D.   On: 03/22/2017 15:38       Today   Subjective:   Jacob Ford  Pt feels better no complaints  Objective:   Blood pressure (!) 146/74, pulse 82, temperature 97.9 F (36.6 C), resp. rate 16, height 6\' 2"  (1.88 m), weight 218 lb 11.2 oz (99.2 kg), SpO2 93 %.  .  Intake/Output Summary (Last 24 hours) at 03/24/2017 1436 Last data filed at 03/24/2017 1357 Gross per 24 hour  Intake 600 ml  Output 1550 ml  Net -950 ml    Exam VITAL SIGNS: Blood pressure (!) 146/74, pulse 82, temperature 97.9 F (36.6 C), resp. rate 16, height 6\' 2"  (1.88 m), weight 218 lb 11.2 oz (99.2 kg), SpO2 93 %.  GENERAL:  77 y.o.-year-old patient lying in the bed with no acute distress.  EYES: Pupils equal, round, reactive to light and accommodation. No scleral icterus. Extraocular muscles intact.  HEENT: Head atraumatic, normocephalic. Oropharynx and nasopharynx clear.  NECK:  Supple, no jugular venous distention. No thyroid enlargement, no tenderness.  LUNGS: Normal breath sounds bilaterally, no  wheezing, rales,rhonchi  or crepitation. No use of accessory muscles of respiration.  CARDIOVASCULAR: S1, S2 normal. No murmurs, rubs, or gallops.  ABDOMEN: Soft, nontender, nondistended. Bowel sounds present. No organomegaly or mass.  EXTREMITIES: No pedal edema, cyanosis, or clubbing.  NEUROLOGIC: Cranial nerves II through XII are intact. Muscle strength 5/5 in all extremities. Sensation intact. Gait not checked.  PSYCHIATRIC: The patient is alert and oriented x 3.  SKIN: No obvious rash, lesion, or ulcer.   Data Review     CBC w Diff:  Lab Results  Component Value Date   WBC 8.2 03/22/2017   HGB 9.6 (L) 03/22/2017   HGB 14.2 12/14/2012   HCT 33.9 (L) 03/22/2017   HCT 43.5 12/14/2012   PLT 214 03/22/2017   PLT 178 12/14/2012   LYMPHOPCT 12 01/20/2016   LYMPHOPCT 29.7 12/14/2012   MONOPCT 11 01/20/2016   MONOPCT 10.2 12/14/2012   EOSPCT 0 01/20/2016   EOSPCT 1.2 12/14/2012   BASOPCT 1 01/20/2016   BASOPCT 1.2 12/14/2012   CMP:  Lab Results  Component Value Date   NA 136 03/23/2017   NA 138 12/14/2012   K 3.7 03/23/2017   K 3.6 12/14/2012   CL 97 (L) 03/23/2017   CL 101 12/14/2012   CO2 30 03/23/2017   CO2 28 12/14/2012   BUN 15 03/23/2017   BUN 14 12/14/2012   CREATININE 1.07 03/23/2017   CREATININE 0.92 12/14/2012   PROT 7.9 01/20/2016   PROT 7.7 12/13/2012   ALBUMIN 3.4 (L) 01/20/2016   ALBUMIN 3.3 (L) 12/13/2012   BILITOT 0.5 01/20/2016   BILITOT 0.2 12/13/2012   ALKPHOS 45 01/20/2016   ALKPHOS 47 12/13/2012   AST 21 01/20/2016   AST 19 12/13/2012   ALT 8 (L) 01/20/2016   ALT 19 12/13/2012  .  Micro Results No results found for this or any previous visit (from the past 240 hour(s)).      Code Status Orders  (From admission, onward)        Start     Ordered   03/22/17 1809  Full code  Continuous     03/22/17 1809    Code Status History    Date Active Date Inactive Code Status Order ID Comments User Context   03/03/2017 14:41 03/06/2017 23:33 Full Code [id number]   Milagros Loll, MD ED   02/10/2016 12:27 02/11/2016 21:38 Full Code [id number]  Annice Needy, MD Inpatient   01/18/2016 11:20 01/18/2016 16:16 Full Code [id number]  Annice Needy, MD Inpatient   12/08/2014 10:02 12/09/2014 20:53 Full Code [id number]  Iran Ouch, MD Inpatient   12/07/2014 20:22 12/08/2014 10:02 Full Code [id number]  Adrian Saran, MD Inpatient          Follow-up Information    Edsel Petrin, DO In 1 week.   Specialty:  Internal Medicine Why:  hosp f/u Contact information: [address] [place] [address] [phone]        pulmonary.   Why:  as scheduled for next week , for next week          Discharge Medications   Allergies as of 03/24/2017   No Known Allergies     Medication List    STOP taking these medications   lisinopril-hydrochlorothiazide 10-12.5 MG tablet Commonly known as:  ZESTORETIC     TAKE these medications   aspirin 81 MG EC tablet Take 1 tablet (81 mg total) by mouth daily.   atorvastatin 80 MG tablet Commonly  known as:  LIPITOR Take 1 tablet (80 mg total) by mouth daily at 6 PM.   clopidogrel 75 MG tablet Commonly known as:  PLAVIX TAKE ONE TABLET BY MOUTH ONCE DAILY   Fluticasone-Salmeterol 250-50 MCG/DOSE Aepb Commonly known as:  ADVAIR DISKUS Inhale 1 puff into the lungs 2 (two) times daily. Rinse mouth after use.   ipratropium-albuterol 0.5-2.5 (3) MG/3ML Soln Commonly known as:  DUONEB Take 3 mLs by nebulization every 6 (six) hours.   Metoprolol Tartrate 75 MG Tabs Take 75 mg by mouth 2 (two) times daily. What changed:    medication strength  how much to take   montelukast 10 MG tablet Commonly known as:  SINGULAIR Take 10 mg by mouth at bedtime.   nitroGLYCERIN 0.4 MG SL tablet Commonly known as:  NITROSTAT Place 1 tablet (0.4 mg total) under the tongue every 5 (five) minutes as needed for chest pain.   pantoprazole 40 MG tablet Commonly known as:  PROTONIX Take 1 tablet (40 mg total)  by mouth daily.   tamsulosin 0.4 MG Caps capsule Commonly known as:  FLOMAX Take 1 capsule (0.4 mg total) by mouth daily.   varenicline 0.5 MG X 11 & 1 MG X 42 tablet Commonly known as:  CHANTIX STARTING MONTH PAK Take one 0.5 mg tab once daily for 3 days, then increase to one 0.5 mg tab twice daily for 4 days, then increase to one 1 mg tab twice daily.   varenicline 1 MG tablet Commonly known as:  CHANTIX CONTINUING MONTH PAK Take 1 tablet (1 mg total) by mouth 2 (two) times daily.          Total Time in preparing paper work, data evaluation and todays exam - 35 minutes  Auburn Bilberry M.D on 03/24/2017 at 2:36 PM [employer]   Office  [phone]

## 2017-03-25 ENCOUNTER — Telehealth: Payer: Self-pay | Admitting: Physician Assistant

## 2017-03-25 NOTE — Telephone Encounter (Signed)
   [facility] and has rx for lisinopril, but it is not on his d/c med list.  Wants to know if he should take it.  Asked him to hold it, f/u as scheduled for PFTs next week.  [facility]ula Listenyan Dunn, [facility].  No other issues at this time.  Theodore DemarkRhonda Arliss Hepburn, PA-C 03/25/2017 5:13 PM Beeper [phone]

## 2017-03-28 ENCOUNTER — Telehealth: Payer: Self-pay | Admitting: Physician Assistant

## 2017-03-28 NOTE — Telephone Encounter (Signed)
I called patient at the request of Eula Listenyan Dunn, PA-C.  Patient admitted 3/13-3/15 for syncope and dehydration. Lisinopril-HCTZ was d/c'd at discharge. Patient called after hours on 3/16 with questions about medication. He also needs a follow up with Dr. Kirke CorinArida or APP. I have left a message on pt's cell VM to contact the office.

## 2017-03-29 ENCOUNTER — Telehealth: Payer: Self-pay | Admitting: Cardiovascular Disease

## 2017-03-29 NOTE — Telephone Encounter (Signed)
Called pt to schedule TCM/PH appt.

## 2017-03-29 NOTE — Telephone Encounter (Signed)
I spoke with patient who understands to discontinue lisinopril-HCTZ per discharge paperwork. He needs a TCM appointment with Dr. Kirke CorinArida or APP. Patient is aware he will receive a call to set up this appointment. Routed to Support Pool to contact patient.

## 2017-03-30 ENCOUNTER — Ambulatory Visit: Payer: Medicare Other | Attending: Family Medicine

## 2017-03-30 DIAGNOSIS — F17210 Nicotine dependence, cigarettes, uncomplicated: Secondary | ICD-10-CM | POA: Diagnosis not present

## 2017-03-30 DIAGNOSIS — F17200 Nicotine dependence, unspecified, uncomplicated: Secondary | ICD-10-CM | POA: Insufficient documentation

## 2017-03-30 DIAGNOSIS — R0602 Shortness of breath: Secondary | ICD-10-CM | POA: Diagnosis present

## 2017-03-30 MED ORDER — ALBUTEROL SULFATE (2.5 MG/3ML) 0.083% IN NEBU
2.5000 mg | INHALATION_SOLUTION | Freq: Once | RESPIRATORY_TRACT | Status: AC
  Filled 2017-03-30: qty 3

## 2017-03-31 NOTE — Telephone Encounter (Signed)
Pt scheduled with Dr Kirke CorinArida on  04/11/17

## 2017-04-11 ENCOUNTER — Ambulatory Visit (INDEPENDENT_AMBULATORY_CARE_PROVIDER_SITE_OTHER): Payer: Medicare Other | Admitting: Cardiovascular Disease

## 2017-04-11 ENCOUNTER — Encounter: Payer: Self-pay | Admitting: Cardiovascular Disease

## 2017-04-11 VITALS — BP 109/67 | HR 94 | Ht 74.0 in | Wt 227.0 lb

## 2017-04-11 DIAGNOSIS — Z720 Tobacco use: Secondary | ICD-10-CM

## 2017-04-11 DIAGNOSIS — E785 Hyperlipidemia, unspecified: Secondary | ICD-10-CM

## 2017-04-11 DIAGNOSIS — I471 Supraventricular tachycardia: Secondary | ICD-10-CM

## 2017-04-11 DIAGNOSIS — I2510 Atherosclerotic heart disease of native coronary artery without angina pectoris: Secondary | ICD-10-CM

## 2017-04-11 DIAGNOSIS — I10 Essential (primary) hypertension: Secondary | ICD-10-CM

## 2017-04-11 MED ORDER — MONTELUKAST SODIUM 10 MG PO TABS: 10.0000 mg | ORAL_TABLET | Freq: Every day | ORAL | 0 refills | Status: AC

## 2017-04-11 NOTE — Patient Instructions (Addendum)
Medication Instructions:  Your physician recommends that you continue on your current medications as directed. Please refer to the Current Medication list given to you today.  Dr. Kirke CorinArida provided one refill on singulair.  Labwork: none  Testing/Procedures: none  Follow-Up: Your physician recommends that you schedule a follow-up appointment in: 3 months with Dr. Kirke CorinArida.    Any Other Special Instructions Will Be Listed Below (If Applicable). If you need assistance finding a primary care physician, you may call [facility], [phone].        If you need a refill on your cardiac medications before your next appointment, please call your pharmacy.

## 2017-04-11 NOTE — Progress Notes (Signed)
Cardiology Office Note   Date:  04/11/2017   ID:  Jacob Ford, Jacob Ford 08-Sep-1940, MRN [id number]  PCP:  Edsel Petrin, DO  Cardiologist:   Lorine Bears, MD   Chief Complaint  Patient presents with  . other    Follow up from [facility]; NSTEMI & syncope. Meds reviewed by the pt. verbally. Pt. c/o shortness of breath with over exertion.       History of Present Illness: Jacob Ford is a 77 y.o. male who presents for a follow-up visit regarding coronary artery disease and syncope. He had non-ST elevation myocardial infarction November 2016. Cardiac catheterization showed occluded distal RCA with significant distal LAD disease. He underwent angioplasty and drug-eluting stent placement to the distal right coronary artery. Distal LAD disease was treated medically. Nuclear stress test in January 2017 showed no evidence of ischemia. 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 The patient was hospitalized in February with unstable angina.  Troponin was negative. Cardiac catheterization showed significant underlying two-vessel coronary artery disease with patent stent in the distal right coronary artery without significant restenosis.  Moderate proximal to mid LAD disease was stable as well as significant distal LAD stenosis.  These were unchanged from before.  EF was 50-55% with mildly elevated LVEDP.  The patient was noted to have an episode of SVT at the end of cardiac catheterization.  He was also noted to have sinus tachycardia and PVCs on telemetry and thus the dose of metoprolol was increased to 50 mg twice daily.  He was seen upon follow-up and was noted to have relatively low blood pressure.  He was instructed to decrease the dose of lisinopril-hydrochlorothiazide but that did not happen.  He ended up next day with another syncopal episode and was  hospitalized.  He was noted to have runs of nonsustained ventricular tachycardia and atrial tachycardia.  He was seen by Dr. Graciela Husbands who recommended increasing the dose of metoprolol.  Lisinopril-hydrochlorothiazide was discontinued. He feels better now with no dizziness or syncope.  Past Medical History:  Diagnosis Date  . Asthma   . CAD (coronary artery disease)    a. 2002 Cath: Nonobs dzs per patient;  b. 11/2014 NSTEMI/PCI: LM nl, LAD 30p, 60p, 80d, D1/2/3 min irregs, RI min irregs, LCX, 16m, OM1 min irregs, OM2 nl, OM3 50ost, RCA 40p, 100d (2.25x18 Xience Alpine DES), EF 55-65%; c. 01/2015 MV: nl EF, no ischemia.  [name] COPD (chronic obstructive pulmonary disease) (HCC)   . Cough    SMOKER. DENIES FEVER/WHEEZING/ COUGH NONPRODUCTIVE  . Dyspnea    chronic  . Hyperlipidemia   . Hypertension   . Hypertensive heart disease   . Myocardial infarction (HCC)    2016  . Peripheral vascular disease (HCC)   . Tobacco abuse   . Tobacco abuse     Past Surgical History:  Procedure Laterality Date  . CARDIAC CATHETERIZATION    . CARDIAC CATHETERIZATION Bilateral 12/08/2014   Procedure: Left Heart Cath and Coronary Angiography;  Surgeon: Iran Ouch, MD;  Location: [facility] INVASIVE CV LAB;  Service: Cardiovascular;  Laterality: Bilateral;  . CARDIAC CATHETERIZATION N/A 12/08/2014   Procedure: Coronary Stent Intervention;  Surgeon: Iran Ouch, MD;  Location: [facility] INVASIVE CV LAB;  Service: Cardiovascular;  Laterality: N/A;  . CORONARY ANGIOPLASTY     stent 2016  . ENDOVASCULAR REPAIR/STENT GRAFT N/A 02/10/2016   Procedure: Endovascular Repair/Stent Graft;  Surgeon: Annice Needy, MD;  Location: [facility] INVASIVE CV LAB;  Service: Cardiovascular;  Laterality: N/A;  . LEFT HEART CATH AND CORONARY ANGIOGRAPHY N/A 03/06/2017   Procedure: LEFT HEART CATH AND CORONARY ANGIOGRAPHY;  Surgeon: Iran Ouch, MD;  Location: [facility] INVASIVE CV LAB;  Service: Cardiovascular;  Laterality: N/A;  . PERIPHERAL  VASCULAR CATHETERIZATION Right 01/18/2016   Procedure: Embolization;  Surgeon: Annice Needy, MD;  Location: [facility] INVASIVE CV LAB;  Service: Cardiovascular;  Laterality: Right;  . SHOULDER SURGERY Left      Current Outpatient Medications  Medication Sig Dispense Refill  . aspirin EC 81 MG EC tablet Take 1 tablet (81 mg total) by mouth daily. 30 tablet 2  . atorvastatin (LIPITOR) 80 MG tablet Take 1 tablet (80 mg total) by mouth daily at 6 PM. 30 tablet 2  . clopidogrel (PLAVIX) 75 MG tablet TAKE ONE TABLET BY MOUTH ONCE DAILY 90 tablet 3  . Fluticasone-Salmeterol (ADVAIR DISKUS) 250-50 MCG/DOSE AEPB Inhale 1 puff into the lungs 2 (two) times daily. Rinse mouth after use. 1 each 11  . ipratropium-albuterol (DUONEB) 0.5-2.5 (3) MG/3ML SOLN Take 3 mLs by nebulization every 6 (six) hours. 360 mL   . metoprolol tartrate 75 MG TABS Take 75 mg by mouth 2 (two) times daily.    . montelukast (SINGULAIR) 10 MG tablet Take 10 mg by mouth at bedtime.    . nitroGLYCERIN (NITROSTAT) 0.4 MG SL tablet Place 1 tablet (0.4 mg total) under the tongue every 5 (five) minutes as needed for chest pain. 25 tablet 3  . pantoprazole (PROTONIX) 40 MG tablet Take 1 tablet (40 mg total) by mouth daily. 30 tablet 1  . tamsulosin (FLOMAX) 0.4 MG CAPS capsule Take 1 capsule (0.4 mg total) by mouth daily. 30 capsule 1  . varenicline (CHANTIX CONTINUING MONTH PAK) 1 MG tablet Take 1 tablet (1 mg total) by mouth 2 (two) times daily. 60 tablet 1  . varenicline (CHANTIX STARTING MONTH PAK) 0.5 MG X 11 & 1 MG X 42 tablet Take one 0.5 mg tab once daily for 3 days, then increase to one 0.5 mg tab twice daily for 4 days, then increase to one 1 mg tab twice daily. 53 tablet 0   No current facility-administered medications for this visit.    Facility-Administered Medications Ordered in Other Visits  Medication Dose Route Frequency Provider Last Rate Last Dose  . albuterol (PROVENTIL) (2.5 MG/3ML) 0.083% nebulizer solution 2.5 mg  2.5 mg  Nebulization Once Garnet Koyanagi, FNP        Allergies:   Patient has no known allergies.    Social History:  The patient  reports that he has been smoking cigarettes.  He has been smoking about 1.00 pack per day. He has never used smokeless tobacco. He reports that he does not drink alcohol or use drugs.   Family History:  The patient's family history includes CAD in his brother and brother; Hypertension in his father and mother; Stroke in his sister and sister.    ROS:  Please see the history of present illness.   Otherwise, review of systems are positive for none.   All other systems are reviewed and negative.    PHYSICAL EXAM: VS:  BP 109/67 (BP Location: Left Arm, Patient Position: Sitting, Cuff Size: Normal)   Pulse 94   Ht 6\' 2"  (1.88 m)   Wt 227 lb (103 kg)   BMI 29.15 kg/m  , BMI Body mass index is 29.15  kg/m. GEN: Well nourished, well developed, in no acute distress  HEENT: normal  Neck: no JVD, carotid bruits, or masses Cardiac: RRR; no murmurs, rubs, or gallops,no edema  Respiratory:  clear to auscultation bilaterally, normal work of breathing GI: soft, nontender, nondistended, + BS MS: no deformity or atrophy  Skin: warm and dry, no rash Neuro:  Strength and sensation are intact Psych: euthymic mood, full affect   EKG:  EKG is ordered today. The ekg ordered today demonstrates sinus rhythm with frequent PACs.   Recent Labs: 03/22/2017: Hemoglobin 9.6; Platelets 214 03/23/2017: BUN 15; Creatinine, Ser 1.07; Magnesium 2.1; Potassium 3.7; Sodium 136; TSH 0.582    Lipid Panel    Component Value Date/Time   CHOL 122 01/15/2015 1110   CHOL 161 12/14/2012 0346   TRIG 68 01/15/2015 1110   TRIG 140 12/14/2012 0346   HDL 36 (L) 01/15/2015 1110   HDL 47 12/14/2012 0346   CHOLHDL 3.4 01/15/2015 1110   CHOLHDL 4.6 12/08/2014 0337   VLDL 10 12/08/2014 0337   VLDL 28 12/14/2012 0346   LDLCALC 72 01/15/2015 1110   LDLCALC 86 12/14/2012 0346      Wt Readings  from Last 3 Encounters:  04/11/17 227 lb (103 kg)  03/24/17 218 lb 11.2 oz (99.2 kg)  03/21/17 224 lb 8 oz (101.8 kg)      No flowsheet data found.    ASSESSMENT AND PLAN:  1.  Coronary artery disease involving native coronary arteries without angina: Recent worsening of chest pain and hospitalization was thought to be due to tachyarrhythmia.  I agree with gradual increase of metoprolol currently at a dose of 75 mg twice daily.  Blood pressure is somewhat on the low side and does not allow increasing the dose.  2.  Recurrent SVT and nonsustained ventricular tachycardia: The patient had syncope which was likely due to hypertension in the setting of tachyarrhythmia.  Lisinopril-hydrochlorothiazide was discontinued.  Continue treatment with metoprolol 75 mg twice daily.  Not able to increase the dose due to low blood pressure.  If this continues to be an issue, we might need to consider adding an antiarrhythmic medication.  I doubt that the patient is going to be a good candidate for ablation given underlying severe COPD.  3. Essential hypertension: He is to stay off lisinopril-hydrochlorothiazide.  Continue metoprolol.  4. Hyperlipidemia: Continue high dose atorvastatin. Most recent LDL was 72.  The patient will need a follow-up lipid and liver profile.  5. Tobacco use I again discussed with him the importance of smoking cessation.  6.  Abdominal aortic and iliac aneurysms: Followed by Dr. dew.   Disposition:   FU with me in 3 months  Signed,  Lorine BearsMuhammad Arida, MD  04/11/2017 4:56 PM    [facility]

## 2017-05-12 ENCOUNTER — Telehealth: Payer: Self-pay | Admitting: Cardiovascular Disease

## 2017-05-12 NOTE — Telephone Encounter (Signed)
Okay to refill? 

## 2017-05-12 NOTE — Telephone Encounter (Signed)
Dr. Kirke Corin, please review and advise on refill request for Pantoprazole.  You can send this back to [facility] or [address] triage pool.  Thanks!

## 2017-05-12 NOTE — Telephone Encounter (Signed)
°*  STAT* If patient is at the pharmacy, call can be transferred to refill team.   1. Which medications need to be refilled? (please list name of each medication and dose if known)    Pantoprazole 40 mg po q day   2. Which pharmacy/location (including street and city if local pharmacy) is medication to be sent to? [facility]   3. Do they need a 30 day or 90 day supply? 90

## 2017-05-12 NOTE — Telephone Encounter (Signed)
Please advise if ok to refill Pantoprazole.  

## 2017-05-15 MED ORDER — PANTOPRAZOLE SODIUM 40 MG PO TBEC: 40.0000 mg | DELAYED_RELEASE_TABLET | Freq: Every day | ORAL | 3 refills | Status: DC

## 2017-05-16 ENCOUNTER — Other Ambulatory Visit: Payer: Self-pay | Admitting: Cardiovascular Disease

## 2017-05-16 NOTE — Telephone Encounter (Signed)
Please review for refill on Singulair.  Dr. Kirke Corin prescribed Singulair but not sure if only for one time.

## 2017-05-16 NOTE — Telephone Encounter (Signed)
Needs to be filled by PCP. Per AVS on 04/11/17- Dr. Kirke Corin provided 1 refill only

## 2017-05-19 ENCOUNTER — Other Ambulatory Visit: Payer: Self-pay | Admitting: Cardiovascular Disease

## 2017-05-22 NOTE — Telephone Encounter (Signed)
Needs to be filled by PCP. Addressed on 05/16/17- Per AVS on 04/11/17- Dr. Kirke Corin provided 1 refill only  Thanks!

## 2017-05-22 NOTE — Telephone Encounter (Signed)
Please advise if ok to refill not cardiac medication. 

## 2017-06-20 ENCOUNTER — Ambulatory Visit: Payer: Medicare Other | Admitting: Internal Medicine

## 2017-06-20 ENCOUNTER — Encounter: Payer: Self-pay | Admitting: Internal Medicine

## 2017-06-20 ENCOUNTER — Ambulatory Visit: Payer: Medicare Other | Admitting: Cardiovascular Disease

## 2017-06-20 VITALS — BP 118/70 | HR 90 | Ht 74.0 in | Wt 225.0 lb

## 2017-06-20 DIAGNOSIS — Z23 Encounter for immunization: Secondary | ICD-10-CM | POA: Diagnosis not present

## 2017-06-20 DIAGNOSIS — J449 Chronic obstructive pulmonary disease, unspecified: Secondary | ICD-10-CM

## 2017-06-20 DIAGNOSIS — F17210 Nicotine dependence, cigarettes, uncomplicated: Secondary | ICD-10-CM

## 2017-06-20 MED ORDER — NICOTINE 21 MG/24HR TD PT24: 21.0000 mg | MEDICATED_PATCH | TRANSDERMAL | 1 refills | Status: AC

## 2017-06-20 MED ORDER — FLUTICASONE FUROATE-VILANTEROL 100-25 MCG/INH IN AEPB: 1.0000 | INHALATION_SPRAY | Freq: Every day | RESPIRATORY_TRACT | 0 refills | Status: DC

## 2017-06-20 NOTE — Addendum Note (Signed)
Addended by: Janean SarkSNIPES, Peja Allender K on: 06/20/2017 03:41 PM   Modules accepted: Orders

## 2017-06-20 NOTE — Progress Notes (Signed)
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 Recent hospitalization due to COPD exacerbation, COPD group D. -She is patient noted improvement with Brio, could not afford it or Advair, will continue with nebulizers as needed. -I administered Prevnar 06/20/2017 - Given his severe COPD (FEV1 26%), do not think the patient would be a good candidate for lung cancer screening. - We will refer to pulmonary rehab.  Nicotine abuse. -Continued nicotine abuse, not think that he can quit yet at this time.  Discussed the importance of smoke cessation, he remains at high risk of exacerbations primarily because of continued smoking. -again Spent greater than 3 minutes in discussion.  Meds ordered this encounter  Medications  . nicotine (NICODERM CQ - DOSED IN MG/24 HOURS) 21 mg/24hr patch    Sig: Place 1 patch (21 mg total) onto the skin daily.    Dispense:  30 patch    Refill:  1   Orders Placed This Encounter  Procedures  . AMB referral to pulmonary rehabilitation    Return in about 6 months (around 12/20/2017).   Date: 06/20/2017  MRN# [id number] Jacob Ford 08-22-1940  Referring Physician: Hospitalist physician.   Jacob Ford is a 77 y.o. old male seen in consultation for chief complaint of:    Chief Complaint  Patient presents with  . Follow-up    COPD: SOB w/activity: cough, prod at times: wheezing    HPI:   Patient is a 77 year old male smoker with history of COPD, coronary artery disease, nicotine abuse, discharged from the hospital in February 2019 of this year for COPD exacerbation. At last visit it was noted that he was having dyspnea on exertion, he continued to smoke a pack a day.  He was using a nebulizer 4 times daily as he could not afford Advair.  Today he continues to have dyspnea on exertion, he is using the nebs 4 times per day, it helps but does not last. He continues to  smoke 1 ppd. He is trying to quit but does not think that he can. He does not that the patch has helped in the past and is interested in trying it.   **PFT 03/30/2017; tracings personally reviewed, FVC is 41% predicted, FEV1 is 26% predicted there is no significant reversibility with bronchodilator therapy.  Flow volume loop appears obstructed. TLC is 56% predicted, RV/TLC ratio is elevated.  Diffusion capacity is severely reduced. - Overall this test shows severe obstructive and restrictive lung disease.  Cardiac catheterization 03/06/17; 1.  Significant underlying two-vessel coronary artery disease with patent stent in the distal right coronary artery without significant restenosis.  Known moderate proximal to mid LAD disease and significant distal LAD stenosis both unchanged from before. 2. Low normal LV systolic function with an EF of 50-55%.  Mildly to moderately reduced LV EDP at 18-20 mmHg.  Medication:    Current Outpatient Medications:  .  aspirin EC 81 MG EC tablet, Take 1 tablet (81 mg total) by mouth daily., Disp: 30 tablet, Rfl: 2 .  atorvastatin (LIPITOR) 80 MG tablet, Take 1 tablet (80 mg total) by mouth daily at 6 PM., Disp: 30 tablet, Rfl: 2 .  clopidogrel (PLAVIX) 75 MG tablet, TAKE ONE TABLET BY MOUTH ONCE DAILY, Disp: 90 tablet, Rfl: 3 .  Fluticasone-Salmeterol (ADVAIR DISKUS) 250-50 MCG/DOSE AEPB, Inhale 1 puff into the lungs 2 (two) times daily. Rinse mouth after use., Disp: 1 each, Rfl: 11 .  ipratropium-albuterol (DUONEB) 0.5-2.5 (3) MG/3ML SOLN, Take 3 mLs by nebulization every 6 (six) hours., Disp: 360 mL, Rfl:  .  metoprolol tartrate 75 MG TABS, Take 75 mg by mouth 2 (two) times daily., Disp: , Rfl:  .  montelukast (SINGULAIR) 10 MG tablet, Take 1 tablet (10 mg total) by mouth at bedtime., Disp: 30 tablet, Rfl: 0 .  nitroGLYCERIN (NITROSTAT) 0.4 MG SL tablet, Place 1 tablet (0.4 mg total) under the tongue every 5 (five) minutes as needed for chest pain., Disp: 25 tablet,  Rfl: 3 .  pantoprazole (PROTONIX) 40 MG tablet, Take 1 tablet (40 mg total) by mouth daily., Disp: 30 tablet, Rfl: 3 .  tamsulosin (FLOMAX) 0.4 MG CAPS capsule, Take 1 capsule (0.4 mg total) by mouth daily., Disp: 30 capsule, Rfl: 1 .  varenicline (CHANTIX CONTINUING MONTH PAK) 1 MG tablet, Take 1 tablet (1 mg total) by mouth 2 (two) times daily., Disp: 60 tablet, Rfl: 1 .  varenicline (CHANTIX STARTING MONTH PAK) 0.5 MG X 11 & 1 MG X 42 tablet, Take one 0.5 mg tab once daily for 3 days, then increase to one 0.5 mg tab twice daily for 4 days, then increase to one 1 mg tab twice daily., Disp: 53 tablet, Rfl: 0 No current facility-administered medications for this visit.   Facility-Administered Medications Ordered in Other Visits:  .  albuterol (PROVENTIL) (2.5 MG/3ML) 0.083% nebulizer solution 2.5 mg, 2.5 mg, Nebulization, Once, Garnet KoyanagiJohnson, Julie R, FNP   Allergies:  Patient has no known allergies.  Review of Systems: Gen:  Denies  fever, sweats, chills HEENT: Denies blurred vision, double vision. bleeds, sore throat Cvc:  No dizziness, chest pain. Resp:   Denies cough or sputum production, shortness of breath Gi: Denies swallowing difficulty, stomach pain. Gu:  Denies bladder incontinence, burning urine Ext:   No Joint pain, stiffness. Skin: No skin rash,  hives  Endoc:  No polyuria, polydipsia. Psych: No depression, insomnia. Other:  All other systems were reviewed with the patient and were negative other that what is mentioned in the HPI.   Physical Examination:   VS: BP 118/70 (BP Location: Left Arm, Cuff Size: Normal)   Pulse 90   Ht 6\' 2"  (1.88 m)   Wt 225 lb (102.1 kg)   SpO2 95%   BMI 28.89 kg/m   General Appearance: No distress  Neuro:without focal findings,  speech normal,  HEENT: PERRLA, EOM intact.  Cigarette burn on short Pulmonary: Decreased air entry bilaterally. CardiovascularNormal S1,S2.  No m/r/g.   Abdomen: Benign, Soft, non-tender. Renal:  No costovertebral  tenderness  GU:  No performed at this time. Endoc: No evident thyromegaly, no signs of acromegaly. Skin:   warm, no rashes, no ecchymosis  Extremities: normal, no cyanosis, clubbing.  Other findings:    LABORATORY PANEL:   CBC No results for input(s): WBC, HGB, HCT, PLT in the last 168 hours. ------------------------------------------------------------------------------------------------------------------  Chemistries  No results for input(s): NA, K, CL, CO2, GLUCOSE, BUN, CREATININE, CALCIUM, MG, AST, ALT, ALKPHOS, BILITOT in the last 168 hours.  Invalid input(s): GFRCGP ------------------------------------------------------------------------------------------------------------------  Cardiac Enzymes No results for input(s): TROPONINI in the last 168 hours. ------------------------------------------------------------  RADIOLOGY:  No results found.     Thank  you for the consultation and for allowing [facility] to assist in the care of your patient. Our recommendations are noted above.  Please contact us if we can be of further service.   Wells Guileseep Darrly Loberg, MD.  Board Certified in Internal  Medicine, Pulmonary Medicine, Critical Care Medicine, and Sleep Medicine.  [facility] Office Number: [phone]  Santiago Glad, M.D.  Billy Fischer, M.D  06/20/2017

## 2017-06-20 NOTE — Patient Instructions (Addendum)
Continue nebulizers. Will give sample of Breo.  Will refer to pulmonary rehab.  Will prescribed nicotine patch.

## 2017-06-30 ENCOUNTER — Other Ambulatory Visit: Payer: Self-pay | Admitting: Cardiovascular Disease

## 2017-08-04 ENCOUNTER — Ambulatory Visit: Payer: Medicare HMO | Admitting: Cardiovascular Disease

## 2017-08-04 ENCOUNTER — Encounter: Payer: Self-pay | Admitting: Cardiovascular Disease

## 2017-08-04 VITALS — BP 122/62 | HR 76 | Ht 74.0 in | Wt 220.0 lb

## 2017-08-04 DIAGNOSIS — E785 Hyperlipidemia, unspecified: Secondary | ICD-10-CM | POA: Diagnosis not present

## 2017-08-04 DIAGNOSIS — I2510 Atherosclerotic heart disease of native coronary artery without angina pectoris: Secondary | ICD-10-CM

## 2017-08-04 DIAGNOSIS — I10 Essential (primary) hypertension: Secondary | ICD-10-CM | POA: Diagnosis not present

## 2017-08-04 DIAGNOSIS — I714 Abdominal aortic aneurysm, without rupture, unspecified: Secondary | ICD-10-CM

## 2017-08-04 DIAGNOSIS — I723 Aneurysm of iliac artery: Secondary | ICD-10-CM

## 2017-08-04 DIAGNOSIS — Z720 Tobacco use: Secondary | ICD-10-CM | POA: Diagnosis not present

## 2017-08-04 DIAGNOSIS — I471 Supraventricular tachycardia: Secondary | ICD-10-CM

## 2017-08-04 NOTE — Progress Notes (Signed)
Cardiology Office Note   Date:  08/04/2017   ID:  Jacob Ford, DOB [birth year], MRN [id number]  PCP:  Edsel PetrinGolding, Elizabeth L, DO  Cardiologist:   Lorine BearsMuhammad Arida, MD   Chief Complaint  Patient presents with  . Other     3 month follow up. Patient c/o SOB when excerting himself. Meds reviewed verbally with patient.       History of Present Illness: Jacob Ford is a 77 y.o. male who presents for a follow-up visit regarding coronary artery disease and PSVT. He had non-ST elevation myocardial infarction November 2016. Cardiac catheterization showed occluded distal RCA with significant distal LAD disease. He underwent angioplasty and drug-eluting stent placement to the distal right coronary artery. Distal LAD disease was treated medically. Nuclear stress test in January 2017 showed no evidence of ischemia. 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 The patient was hospitalized in February, 2019 with unstable angina.  Troponin was negative. Cardiac catheterization showed significant underlying two-vessel coronary artery disease with patent stent in the distal right coronary artery without significant restenosis.  Moderate proximal to mid LAD disease was stable as well as significant distal LAD stenosis.  These were unchanged from before.  EF was 50-55% with mildly elevated LVEDP.  The patient was noted to have an episode of SVT at the end of cardiac catheterization.  He was also noted to have sinus tachycardia and PVCs on telemetry and thus the dose of metoprolol was increased to 50 mg twice daily. He had a syncopal episode subsequently thought to be due to hypotension while he was taking lisinopril hydrochlorothiazide which was discontinued.  The dose of metoprolol was ultimately increased to 75 mg twice daily with subsequent resolution of symptoms and  palpitations. He is doing well with no chest pain or shortness of breath.  He continues to smoke.   Past Medical History:  Diagnosis Date  . Asthma   . CAD (coronary artery disease)    a. 2002 Cath: Nonobs dzs per patient;  b. 11/2014 NSTEMI/PCI: LM nl, LAD 30p, 60p, 80d, D1/2/3 min irregs, RI min irregs, LCX, 236m, OM1 min irregs, OM2 nl, OM3 50ost, RCA 40p, 100d (2.25x18 Xience Alpine DES), EF 55-65%; c. 01/2015 MV: nl EF, no ischemia.  [name]. COPD (chronic obstructive pulmonary disease) (HCC)   . Cough    SMOKER. DENIES FEVER/WHEEZING/ COUGH NONPRODUCTIVE  . Dyspnea    chronic  . Hyperlipidemia   . Hypertension   . Hypertensive heart disease   . Myocardial infarction (HCC)    2016  . Peripheral vascular disease (HCC)   . Tobacco abuse   . Tobacco abuse     Past Surgical History:  Procedure Laterality Date  . CARDIAC CATHETERIZATION    . CARDIAC CATHETERIZATION Bilateral 12/08/2014   Procedure: Left Heart Cath and Coronary Angiography;  Surgeon: [name], MD;  Location: [facility];  Service: Cardiovascular;  Laterality: Bilateral;  . CARDIAC CATHETERIZATION N/A 12/08/2014   Procedure: Coronary Stent Intervention;  Surgeon: [name], MD;  Location: [facility];  Service: Cardiovascular;  Laterality: N/A;  . CORONARY ANGIOPLASTY     stent 2016  . ENDOVASCULAR REPAIR/STENT GRAFT N/A 02/10/2016   Procedure: Endovascular Repair/Stent Graft;  Surgeon: Annice NeedyJason S Dew, MD;  Location: [facility];  Service: Cardiovascular;  Laterality: N/A;  . LEFT HEART CATH AND CORONARY ANGIOGRAPHY N/A 03/06/2017  Procedure: LEFT HEART CATH AND CORONARY ANGIOGRAPHY;  Surgeon: Iran Ouch, MD;  Location: [facility];  Service: Cardiovascular;  Laterality: N/A;  . PERIPHERAL VASCULAR CATHETERIZATION Right 01/18/2016   Procedure: Embolization;  Surgeon: Annice Needy, MD;  Location: [facility];  Service: Cardiovascular;  Laterality: Right;  .  SHOULDER SURGERY Left      Current Outpatient Medications  Medication Sig Dispense Refill  . aspirin EC 81 MG EC tablet Take 1 tablet (81 mg total) by mouth daily. 30 tablet 2  . atorvastatin (LIPITOR) 80 MG tablet Take 1 tablet (80 mg total) by mouth daily at 6 PM. 30 tablet 2  . clopidogrel (PLAVIX) 75 MG tablet TAKE ONE TABLET BY MOUTH ONCE DAILY 90 tablet 3  . ipratropium-albuterol (DUONEB) 0.5-2.5 (3) MG/3ML SOLN Take 3 mLs by nebulization every 6 (six) hours. 360 mL   . metoprolol tartrate 75 MG TABS Take 75 mg by mouth 2 (two) times daily.    . montelukast (SINGULAIR) 10 MG tablet Take 1 tablet (10 mg total) by mouth at bedtime. 30 tablet 0  . nicotine (NICODERM CQ - DOSED IN MG/24 HOURS) 21 mg/24hr patch Place 1 patch (21 mg total) onto the skin daily. 30 patch 1  . nitroGLYCERIN (NITROSTAT) 0.4 MG SL tablet DISSOLVE ONE TABLET UNDER THE TONGUE EVERY 5 MINUTES AS NEEDED FOR CHEST PAIN.  DO NOT EXCEED A TOTAL OF 3 DOSES IN 15 MINUTES 25 tablet 1  . pantoprazole (PROTONIX) 40 MG tablet Take 1 tablet (40 mg total) by mouth daily. 30 tablet 3  . tamsulosin (FLOMAX) 0.4 MG CAPS capsule Take 1 capsule (0.4 mg total) by mouth daily. 30 capsule 1  . TRELEGY ELLIPTA 100-62.5-25 MCG/INH AEPB INHALE 1 PUFF ONCE DAILY  3   No current facility-administered medications for this visit.    Facility-Administered Medications Ordered in Other Visits  Medication Dose Route Frequency Provider Last Rate Last Dose  . albuterol (PROVENTIL) (2.5 MG/3ML) 0.083% nebulizer solution 2.5 mg  2.5 mg Nebulization Once Garnet Koyanagi, FNP        Allergies:   Patient has no known allergies.    Social History:  The patient  reports that he has been smoking cigarettes.  He has been smoking about 1.00 pack per day. He has never used smokeless tobacco. He reports that he does not drink alcohol or use drugs.   Family History:  The patient's family history includes CAD in his brother and brother; Hypertension in his  father and mother; Stroke in his sister and sister.    ROS:  Please see the history of present illness.   Otherwise, review of systems are positive for none.   All other systems are reviewed and negative.    PHYSICAL EXAM: VS:  BP 122/62 (BP Location: Left Arm, Patient Position: Sitting, Cuff Size: Normal)   Pulse 76   Ht 6\' 2"  (1.88 m)   Wt 220 lb (99.8 kg)   BMI 28.25 kg/m  , BMI Body mass index is 28.25 kg/m. GEN: Well nourished, well developed, in no acute distress  HEENT: normal  Neck: no JVD, carotid bruits, or masses Cardiac: RRR; no murmurs, rubs, or gallops,no edema  Respiratory:  clear to auscultation bilaterally, normal work of breathing GI: soft, nontender, nondistended, + BS MS: no deformity or atrophy  Skin: warm and dry, no rash Neuro:  Strength and sensation are intact Psych: euthymic mood, full affect   EKG:  EKG is ordered today.  The ekg ordered today demonstrates normal sinus rhythm with first-degree AV block and one PAC.   Recent Labs: 03/22/2017: Hemoglobin 9.6; Platelets 214 03/23/2017: BUN 15; Creatinine, Ser 1.07; Magnesium 2.1; Potassium 3.7; Sodium 136; TSH 0.582    Lipid Panel    Component Value Date/Time   CHOL 122 01/15/2015 1110   CHOL 161 12/14/2012 0346   TRIG 68 01/15/2015 1110   TRIG 140 12/14/2012 0346   HDL 36 (L) 01/15/2015 1110   HDL 47 12/14/2012 0346   CHOLHDL 3.4 01/15/2015 1110   CHOLHDL 4.6 12/08/2014 0337   VLDL 10 12/08/2014 0337   VLDL 28 12/14/2012 0346   LDLCALC 72 01/15/2015 1110   LDLCALC 86 12/14/2012 0346      Wt Readings from Last 3 Encounters:  08/04/17 220 lb (99.8 kg)  06/20/17 225 lb (102.1 kg)  04/11/17 227 lb (103 kg)      No flowsheet data found.    ASSESSMENT AND PLAN:  1.  Coronary artery disease involving native coronary arteries without angina: He is doing well overall with no anginal symptoms.  Continue medical therapy. I elected to keep him on Plavix given residual disease in the  LAD.  2.  Recurrent SVT and nonsustained ventricular tachycardia: Well-controlled since increasing metoprolol to 75 mg twice daily.  3. Essential hypertension: Blood pressure is controlled on metoprolol.  4. Hyperlipidemia: Continue high dose atorvastatin.  I requested lipid and liver profile.  5. Tobacco use I again discussed with him the importance of smoking cessation.  6.  Abdominal aortic and iliac aneurysms: Followed by Dr. dew.   Disposition:   FU with me in 6 months  Signed,  Lorine Bears, MD  08/04/2017 2:47 PM    [facility]

## 2017-08-04 NOTE — Patient Instructions (Signed)
Medication Instructions: Your physician recommends that you continue on your current medications as directed. Please refer to the Current Medication list given to you today.  If you need a refill on your cardiac medications before your next appointment, please call your pharmacy.   Labwork: Your provider would like for you to have the following labs today: Lipid and Liver  Follow-Up: Your physician wants you to follow-up in 6 months with Dr. Kirke CorinArida.   [facility]!

## 2017-08-05 LAB — HEPATIC FUNCTION PANEL
ALT: 5 IU/L (ref 0–44)
AST: 13 IU/L (ref 0–40)
Albumin: 3.9 g/dL (ref 3.5–4.8)
Alkaline Phosphatase: 52 IU/L (ref 39–117)
Bilirubin Total: 0.3 mg/dL (ref 0.0–1.2)
Bilirubin, Direct: 0.13 mg/dL (ref 0.00–0.40)
Total Protein: 7.3 g/dL (ref 6.0–8.5)

## 2017-08-05 LAB — LIPID PANEL
Chol/HDL Ratio: 3.7 ratio (ref 0.0–5.0)
Cholesterol, Total: 151 mg/dL (ref 100–199)
HDL: 41 mg/dL (ref >39)
LDL Calculated: 96 mg/dL (ref 0–99)
TRIGLYCERIDES: 70 mg/dL (ref 0–149)
VLDL CHOLESTEROL CAL: 14 mg/dL (ref 5–40)

## 2017-08-10 ENCOUNTER — Encounter: Payer: Self-pay | Admitting: *Deleted

## 2017-08-10 NOTE — Telephone Encounter (Addendum)
Left message for pt to call   ----- Message from [name], MD sent at 08/10/2017  9:19 AM EDT ----- Inform patient that labs were normal. Cholesterol was good but not at target. Does he take Atorvastatin regularly? If so then add Zetia 10 mg daily. Repeat labs in 2 months.

## 2017-08-15 ENCOUNTER — Telehealth: Payer: Self-pay | Admitting: *Deleted

## 2017-08-15 DIAGNOSIS — I10 Essential (primary) hypertension: Secondary | ICD-10-CM

## 2017-08-15 DIAGNOSIS — E785 Hyperlipidemia, unspecified: Secondary | ICD-10-CM

## 2017-08-15 MED ORDER — EZETIMIBE 10 MG PO TABS: 10.0000 mg | ORAL_TABLET | Freq: Every day | ORAL | 1 refills | Status: DC

## 2017-08-15 NOTE — Telephone Encounter (Signed)
-----   Message from [name], MD sent at 08/10/2017  9:19 AM EDT ----- Inform patient that labs were normal. Cholesterol was good but not at target. Does he take Atorvastatin regularly? If so then add Zetia 10 mg daily. Repeat labs in 2 months.

## 2017-08-15 NOTE — Telephone Encounter (Addendum)
Patient made aware of results and verbalized understanding.  Zetia 10 mg daily has been sent into the pharmacy and repeat labs has been ordered.

## 2017-08-17 NOTE — Telephone Encounter (Signed)
See telephone note from 8-5 This encounter was created in error - please disregard.

## 2017-08-22 ENCOUNTER — Telehealth: Payer: Self-pay | Admitting: Cardiovascular Disease

## 2017-08-22 ENCOUNTER — Other Ambulatory Visit: Payer: Self-pay | Admitting: *Deleted

## 2017-08-22 MED ORDER — PANTOPRAZOLE SODIUM 40 MG PO TBEC: 40.0000 mg | DELAYED_RELEASE_TABLET | Freq: Every day | ORAL | 5 refills | Status: DC

## 2017-08-22 NOTE — Telephone Encounter (Signed)
°*  STAT* If patient is at the pharmacy, call can be transferred to refill team.   1. Which medications need to be refilled? (please list name of each medication and dose if known) protonix 40 mg po q day   2. Which pharmacy/location (including street and city if local pharmacy) is medication to be sent to? [facility] [place]   3. Do they need a 30 day or 90 day supply? 30

## 2017-08-22 NOTE — Telephone Encounter (Signed)
Requested Prescriptions   Signed Prescriptions Disp Refills  . pantoprazole (PROTONIX) 40 MG tablet 30 tablet 5    Sig: Take 1 tablet (40 mg total) by mouth daily.    Authorizing Provider: ARIDA, MUHAMMAD A    Ordering User: LOPEZ, MARINA C    

## 2017-08-22 NOTE — Telephone Encounter (Signed)
Requested Prescriptions   Signed Prescriptions Disp Refills  . pantoprazole (PROTONIX) 40 MG tablet 30 tablet 5    Sig: Take 1 tablet (40 mg total) by mouth daily.    Authorizing Provider: Lorine BearsARIDA, MUHAMMAD A    Ordering User: Kendrick FriesLOPEZ, MARINA C

## 2017-09-22 ENCOUNTER — Other Ambulatory Visit: Payer: Self-pay | Admitting: Cardiovascular Disease

## 2017-10-11 ENCOUNTER — Ambulatory Visit (INDEPENDENT_AMBULATORY_CARE_PROVIDER_SITE_OTHER): Payer: Medicare HMO | Admitting: Urology

## 2017-10-11 ENCOUNTER — Encounter: Payer: Self-pay | Admitting: Urology

## 2017-10-11 ENCOUNTER — Other Ambulatory Visit: Payer: Self-pay

## 2017-10-11 VITALS — BP 87/53 | HR 87 | Ht 74.0 in | Wt 210.7 lb

## 2017-10-11 DIAGNOSIS — N138 Other obstructive and reflux uropathy: Secondary | ICD-10-CM | POA: Diagnosis not present

## 2017-10-11 DIAGNOSIS — N401 Enlarged prostate with lower urinary tract symptoms: Secondary | ICD-10-CM | POA: Diagnosis not present

## 2017-10-11 MED ORDER — TAMSULOSIN HCL 0.4 MG PO CAPS: 0.4000 mg | ORAL_CAPSULE | Freq: Every day | ORAL | 11 refills | Status: DC

## 2017-10-11 NOTE — Progress Notes (Signed)
10/11/2017 9:16 AM   [name] [birth year] [id number]  Referring provider: [facility] [address] [place], [place] [address]  CC: Urinary frequency, weak stream, elevated PSA  HPI: I had the pleasure of seeing Jacob Ford in urology clinic today in consultation for lower urinary tract symptoms and elevated PSA from Dr. Laural Benes.  He is an African-American male with a family history of prostate cancer and his son who reported urinary symptoms including intermittent stream, weak stream, frequency, and nocturia 1-2 times per night to his PCP.  PSA was checked and was reportedly 5.1, which is within the normal range for his age group.  He previously has trialed Flomax for his urinary symptoms which significantly improved them.  He denies any gross hematuria.  He has an extensive cardiac history with multiple cardiac stents on Plavix, AAA no vascular repair, lifelong smoker, and he also carries nitrates daily for angina.  He also reports some difficulty with erections, he has taken Viagra in the past with improvement in his erections.  He refused digital rectal exam today.   PMH: Past Medical History:  Diagnosis Date  . Asthma   . CAD (coronary artery disease)    a. 2002 Cath: Nonobs dzs per patient;  b. 11/2014 NSTEMI/PCI: LM nl, LAD 30p, 60p, 80d, D1/2/3 min irregs, RI min irregs, LCX, 35m, OM1 min irregs, OM2 nl, OM3 50ost, RCA 40p, 100d (2.25x18 Xience Alpine DES), EF 55-65%; c. 01/2015 MV: nl EF, no ischemia.  [name] COPD (chronic obstructive pulmonary disease) (HCC)   . Cough    SMOKER. DENIES FEVER/WHEEZING/ COUGH NONPRODUCTIVE  . Dyspnea    chronic  . Hyperlipidemia   . Hypertension   . Hypertensive heart disease   . Myocardial infarction (HCC)    2016  . Peripheral vascular disease (HCC)   . Tobacco abuse   . Tobacco abuse     Surgical History: Past Surgical History:  Procedure Laterality Date  . CARDIAC CATHETERIZATION    . CARDIAC  CATHETERIZATION Bilateral 12/08/2014   Procedure: Left Heart Cath and Coronary Angiography;  Surgeon: Iran Ouch, MD;  Location: [facility];  Service: Cardiovascular;  Laterality: Bilateral;  . CARDIAC CATHETERIZATION N/A 12/08/2014   Procedure: Coronary Stent Intervention;  Surgeon: Iran Ouch, MD;  Location: [facility];  Service: Cardiovascular;  Laterality: N/A;  . CORONARY ANGIOPLASTY     stent 2016  . ENDOVASCULAR REPAIR/STENT GRAFT N/A 02/10/2016   Procedure: Endovascular Repair/Stent Graft;  Surgeon: Annice Needy, MD;  Location: [facility];  Service: Cardiovascular;  Laterality: N/A;  . LEFT HEART CATH AND CORONARY ANGIOGRAPHY N/A 03/06/2017   Procedure: LEFT HEART CATH AND CORONARY ANGIOGRAPHY;  Surgeon: Iran Ouch, MD;  Location: [facility];  Service: Cardiovascular;  Laterality: N/A;  . PERIPHERAL VASCULAR CATHETERIZATION Right 01/18/2016   Procedure: Embolization;  Surgeon: Annice Needy, MD;  Location: [facility];  Service: Cardiovascular;  Laterality: Right;  . SHOULDER SURGERY Left     Allergies: No Known Allergies  Family History: Family History  Problem Relation Age of Onset  . Hypertension Mother   . Hypertension Father   . CAD Brother   . CAD Brother   . Stroke Sister   . Stroke Sister     Social History:  reports that he has been smoking cigarettes. He has been smoking about 1.00 pack per day. He has never used smokeless tobacco. He reports that he  does not drink alcohol or use drugs.  ROS: Please see flowsheet from today's date for complete review of systems.  Physical Exam: BP (!) 87/53   Pulse 87   Ht 6\' 2"  (1.88 m)   Wt 210 lb 11.2 oz (95.6 kg)   BMI 27.05 kg/m    Constitutional:  Alert and oriented, No acute distress. Cardiovascular: No clubbing, cyanosis, or edema. Respiratory: Normal respiratory effort, no increased work of breathing. GI: Abdomen is soft, nontender, nondistended, no  abdominal masses GU: No CVA tenderness DRE: Patient refused Lymph: No cervical or inguinal lymphadenopathy. Skin: No rashes, bruises or suspicious lesions. Neurologic: Grossly intact, no focal deficits, moving all 4 extremities. Psychiatric: Normal mood and affect.  Laboratory Data: PSA reportedly 5.1, unfortunately the lab result is not available in epic  Pertinent Imaging: I have personally reviewed the CT angios abdomen pelvis from December 2017: Prostate significantly enlarged at 120g, with intravesical protrusion.  Assessment & Plan:   In summary, [name] is a 77 year old African-American male with significant cardiac and vascular history who presents to discuss urinary symptoms and PSA of 5.1.  He refused DRE in clinic today.  Fortunately, his PSA density is very reassuring, at 0.04.  He has had significant improvement of his urinary symptoms with a trial of Flomax before and he would like to resume this medication.  He also was interested in PDE 5i for erectile dysfunction.  We had a long conversation that these medications are contraindicated with use of nitrates, and with his extensive vascular history and frequency with which he takes nitrates I do not feel this would be a safe class of medications.  -Trial of flomax 0.4mg  -He prefers to follow up PRN, could add finasteride if persistent symptoms with enlarged prostate -No further PSA screening indicated per AUA guidelines  Sondra Come, MD  [facility] [address] [place], [place] [address] [phone]

## 2017-12-20 ENCOUNTER — Ambulatory Visit (INDEPENDENT_AMBULATORY_CARE_PROVIDER_SITE_OTHER): Payer: Medicare HMO | Admitting: Internal Medicine

## 2017-12-20 ENCOUNTER — Encounter: Payer: Self-pay | Admitting: Internal Medicine

## 2017-12-20 VITALS — BP 120/76 | HR 58 | Ht 74.0 in | Wt 209.4 lb

## 2017-12-20 DIAGNOSIS — F17210 Nicotine dependence, cigarettes, uncomplicated: Secondary | ICD-10-CM | POA: Diagnosis not present

## 2017-12-20 DIAGNOSIS — J449 Chronic obstructive pulmonary disease, unspecified: Secondary | ICD-10-CM | POA: Diagnosis not present

## 2017-12-20 DIAGNOSIS — Z720 Tobacco use: Secondary | ICD-10-CM

## 2017-12-20 NOTE — Progress Notes (Signed)
[facility] Pulmonary Medicine Consultation      Assessment and Plan:  Chronic dyspnea on exertion secondary to COPD/emphysema, group D. -Continue Trelegy once daily, reminded to rinse mouth after use. Assured that he does not need to use nebulizer if he is otherwise feeling well.  - Prevnar 06/20/2017 - Given his severe COPD (FEV1 26%), do not think the patient would be a good candidate for lung cancer screening. - He declines pulmonary rehab, recommend that he do exercise bike 5 days per week.  --Declines flu vaccine.   Nicotine abuse. -Continued nicotine abuse not really interested in quitting.  -Spent greater than 3 minutes in discussion.  Return in about 6 months (around 06/21/2018).   Date: 12/20/2017  MRN# [id number] Jacob Ford 23-Aug-1940    Jacob Ford is a 77 y.o. old male seen in consultation for chief complaint of:    Chief Complaint  Patient presents with  . Follow-up    COPD> states he feels like his breathing has actually improved. No new concerns. States he uses Trelegy everyday and Duoneb prn.     HPI:  Patient is a 77 year old male smoker with a history of COPD, coronary artery disease, nicotine abuse, history of hospitalization in February 2019 for COPD exacerbation.  At last visit he was maintained on Breo, nebulizers.  He was referred to pulmonary rehab, and counseled on smoking cessation.   Since his last visit he feels that his breathing is better. He is using trelegy once daily but does not rinse mouth.  He uses nebs twice daily which is his routine.  He continues to smoke, about a ppd, he has tried quitting in the past, longest is for 9 days. He is not very active, he becomes winded and his legs ache when he is acting.  He never went to pulmonary rehab, does not think he was called. He rides an exercise bike at home about 3 days per week.   **PFT 03/30/2017; tracings personally reviewed, FVC is 41% predicted, FEV1 is 26% predicted there is no  significant reversibility with bronchodilator therapy.  Flow volume loop appears obstructed. TLC is 56% predicted, RV/TLC ratio is elevated.  Diffusion capacity is severely reduced. - Overall this test shows severe obstructive and restrictive lung disease.  Cardiac catheterization 03/06/17; 1.  Significant underlying two-vessel coronary artery disease with patent stent in the distal right coronary artery without significant restenosis.  Known moderate proximal to mid LAD disease and significant distal LAD stenosis both unchanged from before. 2. Low normal LV systolic function with an EF of 50-55%.  Mildly to moderately reduced LV EDP at 18-20 mmHg.  Medication:    Current Outpatient Medications:  .  aspirin EC 81 MG EC tablet, Take 1 tablet (81 mg total) by mouth daily., Disp: 30 tablet, Rfl: 2 .  atorvastatin (LIPITOR) 80 MG tablet, Take 1 tablet (80 mg total) by mouth daily at 6 PM., Disp: 30 tablet, Rfl: 2 .  clopidogrel (PLAVIX) 75 MG tablet, TAKE 1 TABLET BY MOUTH ONCE DAILY, Disp: 90 tablet, Rfl: 3 .  ezetimibe (ZETIA) 10 MG tablet, Take 1 tablet (10 mg total) by mouth daily., Disp: 90 tablet, Rfl: 1 .  ipratropium-albuterol (DUONEB) 0.5-2.5 (3) MG/3ML SOLN, Take 3 mLs by nebulization every 6 (six) hours., Disp: 360 mL, Rfl:  .  metoprolol tartrate 75 MG TABS, Take 75 mg by mouth 2 (two) times daily., Disp: , Rfl:  .  montelukast (SINGULAIR) 10 MG tablet, Take 1 tablet (10 mg  total) by mouth at bedtime., Disp: 30 tablet, Rfl: 0 .  nicotine (NICODERM CQ - DOSED IN MG/24 HOURS) 21 mg/24hr patch, Place 1 patch (21 mg total) onto the skin daily., Disp: 30 patch, Rfl: 1 .  nitroGLYCERIN (NITROSTAT) 0.4 MG SL tablet, DISSOLVE ONE TABLET UNDER THE TONGUE EVERY 5 MINUTES AS NEEDED FOR CHEST PAIN.  DO NOT EXCEED A TOTAL OF 3 DOSES IN 15 MINUTES, Disp: 25 tablet, Rfl: 1 .  pantoprazole (PROTONIX) 40 MG tablet, Take 1 tablet (40 mg total) by mouth daily., Disp: 30 tablet, Rfl: 5 .  tamsulosin (FLOMAX)  0.4 MG CAPS capsule, Take 1 capsule (0.4 mg total) by mouth daily., Disp: 30 capsule, Rfl: 1 .  tamsulosin (FLOMAX) 0.4 MG CAPS capsule, Take 1 capsule (0.4 mg total) by mouth daily., Disp: 30 capsule, Rfl: 11 .  TRELEGY ELLIPTA 100-62.5-25 MCG/INH AEPB, INHALE 1 PUFF ONCE DAILY, Disp: , Rfl: 3 No current facility-administered medications for this visit.   Facility-Administered Medications Ordered in Other Visits:  .  albuterol (PROVENTIL) (2.5 MG/3ML) 0.083% nebulizer solution 2.5 mg, 2.5 mg, Nebulization, Once, Garnet KoyanagiJohnson, Julie R, FNP   Allergies:  Patient has no known allergies.  Review of Systems:  Constitutional: Feels well. Cardiovascular: Denies chest pain, exertional chest pain.  Pulmonary: Denies hemoptysis, pleuritic chest pain.   The remainder of systems were reviewed and were found to be negative other than what is documented in the HPI.    Physical Examination:   VS: BP 120/76   Pulse (!) 58   Ht 6\' 2"  (1.88 m)   Wt 209 lb 6.4 oz (95 kg)   SpO2 98%   BMI 26.89 kg/m   General Appearance: No distress  Neuro:without focal findings, mental status, speech normal, alert and oriented HEENT: PERRLA, EOM intact Pulmonary: No wheezing, No rales  CardiovascularNormal S1,S2.  No m/r/g.  Abdomen: Benign, Soft, non-tender, No masses Renal:  No costovertebral tenderness  GU:  No performed at this time. Endoc: No evident thyromegaly, no signs of acromegaly or Cushing features Skin:   warm, no rashes, no ecchymosis  Extremities: normal, no cyanosis, clubbing.      LABORATORY PANEL:   CBC No results for input(s): WBC, HGB, HCT, PLT in the last 168 hours. ------------------------------------------------------------------------------------------------------------------  Chemistries  No results for input(s): NA, K, CL, CO2, GLUCOSE, BUN, CREATININE, CALCIUM, MG, AST, ALT, ALKPHOS, BILITOT in the last 168 hours.  Invalid input(s):  GFRCGP ------------------------------------------------------------------------------------------------------------------  Cardiac Enzymes No results for input(s): TROPONINI in the last 168 hours. ------------------------------------------------------------  RADIOLOGY:  No results found.     Thank  you for the consultation and for allowing [facility] to assist in the care of your patient. Our recommendations are noted above.  Please contact us if we can be of further service.  Wells Guileseep Kinslea Frances, M.D., F.C.C.P.  Board Certified in Internal Medicine, Pulmonary Medicine, Critical Care Medicine, and Sleep Medicine.  [facility] Office Number: [phone]  12/20/2017

## 2017-12-20 NOTE — Patient Instructions (Addendum)
Rinse mouth after using trelegy inhaler.  You do not need to use nebulizer if you are feeling ok, only use it if your breathing is tight.  Do your exercise bike 5 times per week.   --Quitting smoking is the most important thing that you can do for your health.  --Quitting smoking will have greater affect on your health than any medicine that we can give you.

## 2017-12-27 ENCOUNTER — Emergency Department
Admission: EM | Admit: 2017-12-27 | Discharge: 2017-12-27 | Disposition: A | Discharge status: Home / Self Care | Payer: Medicare HMO | Attending: Emergency Medicine | Admitting: Emergency Medicine

## 2017-12-27 ENCOUNTER — Other Ambulatory Visit: Payer: Self-pay

## 2017-12-27 ENCOUNTER — Encounter: Payer: Self-pay | Admitting: Emergency Medicine

## 2017-12-27 ENCOUNTER — Emergency Department: Payer: Medicare HMO

## 2017-12-27 DIAGNOSIS — Z955 Presence of coronary angioplasty implant and graft: Secondary | ICD-10-CM | POA: Diagnosis not present

## 2017-12-27 DIAGNOSIS — I252 Old myocardial infarction: Secondary | ICD-10-CM | POA: Diagnosis not present

## 2017-12-27 DIAGNOSIS — I959 Hypotension, unspecified: Secondary | ICD-10-CM

## 2017-12-27 DIAGNOSIS — I10 Essential (primary) hypertension: Secondary | ICD-10-CM | POA: Diagnosis not present

## 2017-12-27 DIAGNOSIS — J449 Chronic obstructive pulmonary disease, unspecified: Secondary | ICD-10-CM | POA: Diagnosis not present

## 2017-12-27 DIAGNOSIS — R55 Syncope and collapse: Secondary | ICD-10-CM

## 2017-12-27 DIAGNOSIS — I2510 Atherosclerotic heart disease of native coronary artery without angina pectoris: Secondary | ICD-10-CM | POA: Diagnosis not present

## 2017-12-27 DIAGNOSIS — F17210 Nicotine dependence, cigarettes, uncomplicated: Secondary | ICD-10-CM | POA: Diagnosis not present

## 2017-12-27 LAB — CBC
HCT: 38 % — ABNORMAL LOW (ref 39.0–52.0)
Hemoglobin: 10.8 g/dL — ABNORMAL LOW (ref 13.0–17.0)
MCH: 21.1 pg — ABNORMAL LOW (ref 26.0–34.0)
MCHC: 28.4 g/dL — ABNORMAL LOW (ref 30.0–36.0)
MCV: 74.2 fL — ABNORMAL LOW (ref 80.0–100.0)
Platelets: 180 10*3/uL (ref 150–400)
RBC: 5.12 MIL/uL (ref 4.22–5.81)
RDW: 21.6 % — ABNORMAL HIGH (ref 11.5–15.5)
WBC: 9.8 10*3/uL (ref 4.0–10.5)
nRBC: 0 % (ref 0.0–0.2)

## 2017-12-27 LAB — BASIC METABOLIC PANEL
Anion gap: 7 (ref 5–15)
BUN: 14 mg/dL (ref 8–23)
CO2: 24 mmol/L (ref 22–32)
CREATININE: 1.09 mg/dL (ref 0.61–1.24)
Calcium: 8.1 mg/dL — ABNORMAL LOW (ref 8.9–10.3)
Chloride: 104 mmol/L (ref 98–111)
GFR calc non Af Amer: >60 mL/min (ref >60)
Glucose, Bld: 154 mg/dL — ABNORMAL HIGH (ref 70–99)
Potassium: 3.6 mmol/L (ref 3.5–5.1)
Sodium: 135 mmol/L (ref 135–145)

## 2017-12-27 LAB — PROTIME-INR
INR: 1.1
Prothrombin Time: 14.1 seconds (ref 11.4–15.2)

## 2017-12-27 LAB — TROPONIN I: Troponin I: <0.03 ng/mL (ref <0.03)

## 2017-12-27 MED ORDER — SODIUM CHLORIDE 0.9 % IV BOLUS
500.0000 mL | Freq: Once | INTRAVENOUS | Status: AC
  Administered 2017-12-27: 500 mL via INTRAVENOUS

## 2017-12-27 NOTE — ED Notes (Signed)
Pt able to ambulate without assistance from bedside to EMS bay and back. BP and pulse prior to walk 123/67, p68. After ambulation 99/63, p68. EDP notified. Pt returned to bed and stated he felt more comfortable sitting on the foot of the bed.

## 2017-12-27 NOTE — ED Triage Notes (Signed)
Pt presents from [facility] with c/o dizziness. Pt was standing in line at a [employer] and began to feel really dizzy in line and had to sit down. A nurse at the church gave the pt 2 nitros back to back. Pt denies ever having any chest pain during sizzy spell. EMS gave pt 700cc fluid in route. Pt latest pressure for ems 89/42. Pt denies any pain. CBG 155. 18G in right ac.

## 2017-12-27 NOTE — ED Provider Notes (Signed)
[facility] Emergency Department Provider Note       Time seen: ----------------------------------------- 10:41 AM on 12/27/2017 -----------------------------------------   I have reviewed the triage vital signs and the nursing notes.  HISTORY   Chief Complaint Dizziness   HPI Jacob Ford is a 77 y.o. male with a history of asthma, coronary disease, COPD, hyperlipidemia, hypertension, MI, peripheral vascular disease who presents to the ED for [facility].  Patient was standing in line at the [employer] and had a syncopal event.  Patient reports he syncopized once earlier this year.  No specific diagnosis was obtained.  He states he feels fine currently.  After he passed out someone gave him 2 sublingual nitroglycerin.  He was not having chest pain or palpitations.  Past Medical History:  Diagnosis Date  . Asthma   . CAD (coronary artery disease)    a. 2002 Cath: Nonobs dzs per patient;  b. 11/2014 NSTEMI/PCI: LM nl, LAD 30p, 60p, 80d, D1/2/3 min irregs, RI min irregs, LCX, 82m, OM1 min irregs, OM2 nl, OM3 50ost, RCA 40p, 100d (2.25x18 Xience Alpine DES), EF 55-65%; c. 01/2015 MV: nl EF, no ischemia.  [name] COPD (chronic obstructive pulmonary disease) (HCC)   . Cough    SMOKER. DENIES FEVER/WHEEZING/ COUGH NONPRODUCTIVE  . Dyspnea    chronic  . Hyperlipidemia   . Hypertension   . Hypertensive heart disease   . Myocardial infarction (HCC)    2016  . Peripheral vascular disease (HCC)   . Tobacco abuse   . Tobacco abuse     Patient Active Problem List   Diagnosis Date Noted  . Syncope 03/22/2017  . Chest pain with high risk for cardiac etiology 03/03/2017  . Back pain 06/03/2016  . Essential hypertension, benign 12/25/2015  . AAA (abdominal aortic aneurysm) without rupture (HCC) 12/25/2015  . Tobacco abuse   . Hyperlipidemia   . CAD (coronary artery disease)   . Hypertensive heart disease   . COPD (chronic obstructive pulmonary disease) (HCC)   .  Angina pectoris (HCC)   . Coronary artery disease involving native coronary artery of native heart with angina pectoris with documented spasm (HCC)   . Smoker   . NSTEMI (non-ST elevated myocardial infarction) (HCC) 12/07/2014    Past Surgical History:  Procedure Laterality Date  . CARDIAC CATHETERIZATION    . CARDIAC CATHETERIZATION Bilateral 12/08/2014   Procedure: Left Heart Cath and Coronary Angiography;  Surgeon: Iran Ouch, MD;  Location: [facility] INVASIVE CV LAB;  Service: Cardiovascular;  Laterality: Bilateral;  . CARDIAC CATHETERIZATION N/A 12/08/2014   Procedure: Coronary Stent Intervention;  Surgeon: Iran Ouch, MD;  Location: [facility] INVASIVE CV LAB;  Service: Cardiovascular;  Laterality: N/A;  . CORONARY ANGIOPLASTY     stent 2016  . ENDOVASCULAR REPAIR/STENT GRAFT N/A 02/10/2016   Procedure: Endovascular Repair/Stent Graft;  Surgeon: Annice Needy, MD;  Location: [facility] INVASIVE CV LAB;  Service: Cardiovascular;  Laterality: N/A;  . LEFT HEART CATH AND CORONARY ANGIOGRAPHY N/A 03/06/2017   Procedure: LEFT HEART CATH AND CORONARY ANGIOGRAPHY;  Surgeon: Iran Ouch, MD;  Location: [facility] INVASIVE CV LAB;  Service: Cardiovascular;  Laterality: N/A;  . PERIPHERAL VASCULAR CATHETERIZATION Right 01/18/2016   Procedure: Embolization;  Surgeon: Annice Needy, MD;  Location: [facility] INVASIVE CV LAB;  Service: Cardiovascular;  Laterality: Right;  . SHOULDER SURGERY Left     Allergies Patient has no known allergies.  Social History Social History   Tobacco Use  . Smoking status: Current  Every Day Smoker    Packs/day: 1.00    Types: Cigarettes  . Smokeless tobacco: Never Used  . Tobacco comment: pack per day 12.11.2019  Substance Use Topics  . Alcohol use: No  . Drug use: No   Review of Systems Constitutional: Negative for fever. Cardiovascular: Negative for chest pain.  Positive for syncope Respiratory: Negative for shortness of breath. Gastrointestinal: Negative for  abdominal pain, vomiting and diarrhea. Musculoskeletal: Negative for back pain. Skin: Negative for rash. Neurological: Negative for headaches, focal weakness or numbness.  All systems negative/normal/unremarkable except as stated in the HPI  ____________________________________________   PHYSICAL EXAM:  VITAL SIGNS: ED Triage Vitals  Enc Vitals Group     BP 12/27/17 1035 (!) 94/58     Pulse --      Resp 12/27/17 1035 17     Temp --      Temp src --      SpO2 --      Weight 12/27/17 1036 209 lb 6.4 oz (95 kg)     Height 12/27/17 1036 6\' 2"  (1.88 m)     Head Circumference --      Peak Flow --      Pain Score 12/27/17 1036 0     Pain Loc --      Pain Edu? --      Excl. in GC? --    Constitutional: Alert and oriented. Well appearing and in no distress. Eyes: Conjunctivae are normal. Normal extraocular movements. ENT   Head: Normocephalic and atraumatic.   Nose: No congestion/rhinnorhea.   Mouth/Throat: Mucous membranes are moist.   Neck: No stridor. Cardiovascular: Normal rate, regular rhythm. No murmurs, rubs, or gallops. Respiratory: Normal respiratory effort without tachypnea nor retractions. Breath sounds are clear and equal bilaterally. No wheezes/rales/rhonchi. Gastrointestinal: Soft and nontender. Normal bowel sounds Musculoskeletal: Nontender with normal range of motion in extremities. No lower extremity tenderness nor edema. Neurologic:  Normal speech and language. No gross focal neurologic deficits are appreciated.  Skin:  Skin is warm, dry and intact. No rash noted. Psychiatric: Mood and affect are normal. Speech and behavior are normal.  ____________________________________________  EKG: Interpreted by me.  Sinus rhythm with first-degree AV block, nonspecific ST segment changes, normal QT.  ____________________________________________  ED COURSE:  As part of my medical decision making, I reviewed the following data within the electronic medical  record:  History obtained from family if available, nursing notes, old chart and ekg, as well as notes from prior ED visits. Patient presented for syncope, we will assess with labs and imaging as indicated at this time.   Procedures ____________________________________________   LABS (pertinent positives/negatives)  Labs Reviewed  BASIC METABOLIC PANEL - Abnormal; Notable for the following components:      Result Value   Glucose, Bld 154 (*)    Calcium 8.1 (*)    All other components within normal limits  CBC - Abnormal; Notable for the following components:   Hemoglobin 10.8 (*)    HCT 38.0 (*)    MCV 74.2 (*)    MCH 21.1 (*)    MCHC 28.4 (*)    RDW 21.6 (*)    All other components within normal limits  TROPONIN I  PROTIME-INR    RADIOLOGY  Chest x-ray is normal ____________________________________________  DIFFERENTIAL DIAGNOSIS   Orthostatic hypotension, dehydration, electrolyte abnormality, medication side effect, arrhythmia, MI  FINAL ASSESSMENT AND PLAN  Syncope   Plan: The patient had presented for syncope. Patient's labs did not  reveal any acute process. Patient's imaging was negative.  Patient did have transient hypotension which likely causes syncope.  Patient reports he took his blood pressure medicine this morning and did not eat anything.  I will decrease his dose of metoprolol.  He does not want to stay in the hospital.  I will advise close outpatient follow-up and blood pressure checks before he actually takes his blood pressure medicine in the morning.   Ulice Dash, MD   Note: This note was generated in part or whole with voice recognition software. Voice recognition is usually quite accurate but there are transcription errors that can and very often do occur. I apologize for any typographical errors that were not detected and corrected.     Emily Filbert, MD 12/27/17 1340

## 2017-12-27 NOTE — ED Notes (Signed)
Pt states he has a hx of getting dizzy and passing out. Pt reports to MD that he was seen here recently for a similar episode. Pt denies any dizziness at this time. MD Williams aware of pt's orthostatic pressure. 500 cc bolus ordered at this time. This RN will continue to monitor pt.

## 2017-12-27 NOTE — ED Notes (Signed)
Given drink.  

## 2017-12-27 NOTE — ED Notes (Signed)
Pt in NAD and leaving with family.

## 2018-02-16 ENCOUNTER — Other Ambulatory Visit: Payer: Self-pay | Admitting: Cardiovascular Disease

## 2018-04-01 ENCOUNTER — Other Ambulatory Visit: Payer: Self-pay | Admitting: Cardiovascular Disease

## 2018-04-24 ENCOUNTER — Telehealth: Payer: Self-pay

## 2018-04-24 NOTE — Telephone Encounter (Signed)
YOUR CARDIOLOGY TEAM HAS ARRANGED FOR AN E-VISIT FOR YOUR APPOINTMENT - PLEASE REVIEW IMPORTANT INFORMATION BELOW SEVERAL DAYS PRIOR TO YOUR APPOINTMENT  Due to the recent COVID-19 pandemic, we are transitioning in-person office visits to tele-medicine visits in an effort to decrease unnecessary exposure to our patients and staff. Medicare and most insurances are covering these visits without a copay needed. We also encourage you to sign up for MyChart if you have not already done so. You will need a smartphone if possible. For patients that do not have this, we can still complete the visit using a regular telephone but do prefer a smartphone to enable video when possible. You may have a close family member that lives with you that can help. If possible, we also ask that you have a blood pressure cuff and scale at home to measure your blood pressure, heart rate and weight prior to your scheduled appointment. Patients with clinical needs that need an in-person evaluation and testing will still be able to come to the office if absolutely necessary. If you have any questions, feel free to call our office.  - If Apple, go to [employer] and type in WebEx in the search bar. Download [employer], the blue/green circle. The app is free but as with any other app download, your phone may require you to verify saved payment information or Apple password. You do NOT have to create a WebEx account.  - If Android, go to [employer] and type in [employer] in the search bar. Download [employer], the blue/green circle. The app is free but as with any other app download, your phone may require you to verify saved payment information or Android password. You do NOT have to create a WebEx account.  It is very helpful to have this downloaded before your visit.    2-3 DAYS BEFORE YOUR APPOINTMENT  You will receive a telephone call from one of our [facility] team members - your caller ID may say "Unknown  caller." If this is a video visit, we will confirm that you have been able to download any necessary apps prior to the visit. We will remind you check your blood pressure, heart rate and weight prior to your scheduled appointment. If you have an Apple Watch or Kardia, please upload any pertinent ECG strips the day before or morning of your appointment to MyChart. Our staff will also make sure you have reviewed the consent and agree to move forward with your scheduled tele-health visit.     THE DAY OF YOUR APPOINTMENT  Approximately 15 minutes prior to your scheduled appointment, you will receive a telephone call from one of [facility] team - your caller ID may say "Unknown caller."  Our staff will confirm medications, vital signs for the day and any symptoms you may be experiencing. Please have this information available prior to the time of visit start. It may also be helpful for you to have a pad of paper and pen handy for any instructions given during your visit. They will also walk you through joining the smartphone meeting if this is a video visit.    CONSENT FOR TELE-HEALTH VISIT - PLEASE REVIEW  I hereby voluntarily request, consent and authorize [facility] and its employed or contracted physicians, physician assistants, nurse practitioners or other licensed health care professionals (the Practitioner), to provide me with telemedicine health care services (the "Services") as deemed necessary by the treating Practitioner. I acknowledge and consent to receive the  Services by the Practitioner via telemedicine. I understand that the telemedicine visit will involve communicating with the Practitioner through live audiovisual communication technology and the disclosure of certain medical information by electronic transmission. I acknowledge that I have been given the opportunity to request an in-person assessment or other available alternative prior to the telemedicine visit and am voluntarily  participating in the telemedicine visit.  I understand that I have the right to withhold or withdraw my consent to the use of telemedicine in the course of my care at any time, without affecting my right to future care or treatment, and that the Practitioner or I may terminate the telemedicine visit at any time. I understand that I have the right to inspect all information obtained and/or recorded in the course of the telemedicine visit and may receive copies of available information for a reasonable fee.  I understand that some of the potential risks of receiving the Services via telemedicine include:  [name]. Delay or interruption in medical evaluation due to technological equipment failure or disruption; . Information transmitted may not be sufficient (e.g. poor resolution of images) to allow for appropriate medical decision making by the Practitioner; and/or  . In rare instances, security protocols could fail, causing a breach of personal health information.  Furthermore, I acknowledge that it is my responsibility to provide information about my medical history, conditions and care that is complete and accurate to the best of my ability. I acknowledge that Practitioner's advice, recommendations, and/or decision may be based on factors not within their control, such as incomplete or inaccurate data provided by me or distortions of diagnostic images or specimens that may result from electronic transmissions. I understand that the practice of medicine is not an exact science and that Practitioner makes no warranties or guarantees regarding treatment outcomes. I acknowledge that I will receive a copy of this consent concurrently upon execution via email to the email address I last provided but may also request a printed copy by calling the office of [facility].    I understand that my insurance will be billed for this visit.   I have read or had this consent read to me. . I understand the contents of this  consent, which adequately explains the benefits and risks of the Services being provided via telemedicine.  . I have been provided ample opportunity to ask questions regarding this consent and the Services and have had my questions answered to my satisfaction. . I give my informed consent for the services to be provided through the use of telemedicine in my medical care  By participating in this telemedicine visit I agree to the above.

## 2018-05-03 ENCOUNTER — Encounter: Payer: Self-pay | Admitting: Cardiovascular Disease

## 2018-05-03 ENCOUNTER — Other Ambulatory Visit: Payer: Self-pay

## 2018-05-03 ENCOUNTER — Other Ambulatory Visit: Payer: Self-pay | Admitting: *Deleted

## 2018-05-03 ENCOUNTER — Telehealth (INDEPENDENT_AMBULATORY_CARE_PROVIDER_SITE_OTHER): Payer: Medicare HMO | Admitting: Cardiovascular Disease

## 2018-05-03 VITALS — BP 140/68 | HR 76 | Ht 74.0 in | Wt 220.0 lb

## 2018-05-03 DIAGNOSIS — E785 Hyperlipidemia, unspecified: Secondary | ICD-10-CM

## 2018-05-03 DIAGNOSIS — I2510 Atherosclerotic heart disease of native coronary artery without angina pectoris: Secondary | ICD-10-CM

## 2018-05-03 DIAGNOSIS — I10 Essential (primary) hypertension: Secondary | ICD-10-CM

## 2018-05-03 MED ORDER — EZETIMIBE 10 MG PO TABS: 10.0000 mg | ORAL_TABLET | Freq: Every day | ORAL | 2 refills | Status: AC

## 2018-05-03 MED ORDER — PANTOPRAZOLE SODIUM 40 MG PO TBEC: 40.0000 mg | DELAYED_RELEASE_TABLET | Freq: Every day | ORAL | 3 refills | Status: AC

## 2018-05-03 NOTE — Progress Notes (Signed)
Virtual Visit via Telephone Note   This visit type was conducted due to national recommendations for restrictions regarding the COVID-19 Pandemic (e.g. social distancing) in an effort to limit this patient's exposure and mitigate transmission in our community.  Due to his co-morbid illnesses, this patient is at least at moderate risk for complications without adequate follow up.  This format is felt to be most appropriate for this patient at this time.  The patient did not have access to video technology/had technical difficulties with video requiring transitioning to audio format only (telephone).  All issues noted in this document were discussed and addressed.  No physical exam could be performed with this format.  Please refer to the patient's chart for his  consent to telehealth for [facility].   Evaluation Performed:  Follow-up visit  Date:  05/03/2018   ID:  Jacob Ford, Jacob Ford 1976/01/23, MRN [id number]  Patient Location: Home Provider Location: Office  PCP:  Garnet Koyanagi, FNP  Cardiologist:  No primary care provider on file.  Electrophysiologist:  None   Chief Complaint: Follow-up visit Follow-up History of Present Illness:    Jacob Ford is a 78 y.o. male who was reached today by phone for follow-up visit. He is followed for coronary artery disease, syncope and PSVT. He had non-ST elevation myocardial infarction November 2016. Cardiac catheterization showed occluded distal RCA with significant distal LAD disease. He underwent angioplasty and drug-eluting stent placement to the distal right coronary artery. Distal LAD disease was treated medically. Nuclear stress test in January 2017 showed no evidence of ischemia. 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 The patient was hospitalized in February, 2019 with unstable angina.  Troponin  was negative. Cardiac catheterization showed significant underlying two-vessel coronary artery disease with patent stent in the distal right coronary artery without significant restenosis.  Moderate proximal to mid LAD disease was stable as well as significant distal LAD stenosis.  These were unchanged from before.  EF was 50-55% with mildly elevated LVEDP.  The patient was noted to have an episode of SVT at the end of cardiac catheterization.  He was also noted to have sinus tachycardia and PVCs on telemetry and thus the dose of metoprolol was increased to 50 mg twice daily. He has known history of recurrent syncope thought to be due to intermittent hypotension. He has been doing well with no recent chest pain, worsening dyspnea or dizziness.  Unfortunately, he continues to smoke and has baseline dyspnea related to COPD.  Zetia was added during last visit for hyperlipidemia given that LDL was above target in spite of atorvastatin.   The patient does not have symptoms concerning for COVID-19 infection (fever, chills, cough, or new shortness of breath).    Past Medical History:  Diagnosis Date  . Asthma   . CAD (coronary artery disease)    a. 2002 Cath: Nonobs dzs per patient;  b. 11/2014 NSTEMI/PCI: LM nl, LAD 30p, 60p, 80d, D1/2/3 min irregs, RI min irregs, LCX, 98m, OM1 min irregs, OM2 nl, OM3 50ost, RCA 40p, 100d (2.25x18 Xience Alpine DES), EF 55-65%; c. 01/2015 MV: nl EF, no ischemia.  [name] COPD (chronic obstructive pulmonary disease) (HCC)   . Cough    SMOKER. DENIES FEVER/WHEEZING/ COUGH NONPRODUCTIVE  . Dyspnea    chronic  . Hyperlipidemia   . Hypertension   . Hypertensive heart disease   . Myocardial infarction (HCC)  2016  . Peripheral vascular disease (HCC)   . Tobacco abuse   . Tobacco abuse    Past Surgical History:  Procedure Laterality Date  . CARDIAC CATHETERIZATION    . CARDIAC CATHETERIZATION Bilateral 12/08/2014   Procedure: Left Heart Cath and Coronary Angiography;   Surgeon: Iran OuchMuhammad A Tiajah Oyster, MD;  Location: [facility] INVASIVE CV LAB;  Service: Cardiovascular;  Laterality: Bilateral;  . CARDIAC CATHETERIZATION N/A 12/08/2014   Procedure: Coronary Stent Intervention;  Surgeon: Iran OuchMuhammad A Sigmund Morera, MD;  Location: [facility] INVASIVE CV LAB;  Service: Cardiovascular;  Laterality: N/A;  . CORONARY ANGIOPLASTY     stent 2016  . ENDOVASCULAR REPAIR/STENT GRAFT N/A 02/10/2016   Procedure: Endovascular Repair/Stent Graft;  Surgeon: Annice NeedyJason S Dew, MD;  Location: [facility] INVASIVE CV LAB;  Service: Cardiovascular;  Laterality: N/A;  . LEFT HEART CATH AND CORONARY ANGIOGRAPHY N/A 03/06/2017   Procedure: LEFT HEART CATH AND CORONARY ANGIOGRAPHY;  Surgeon: Iran OuchArida, Rowan Pollman A, MD;  Location: [facility] INVASIVE CV LAB;  Service: Cardiovascular;  Laterality: N/A;  . PERIPHERAL VASCULAR CATHETERIZATION Right 01/18/2016   Procedure: Embolization;  Surgeon: Annice NeedyJason S Dew, MD;  Location: [facility] INVASIVE CV LAB;  Service: Cardiovascular;  Laterality: Right;  . SHOULDER SURGERY Left      Current Meds  Medication Sig  . aspirin EC 81 MG EC tablet Take 1 tablet (81 mg total) by mouth daily.  [name]. atorvastatin (LIPITOR) 80 MG tablet Take 1 tablet (80 mg total) by mouth daily at 6 PM.  . clopidogrel (PLAVIX) 75 MG tablet TAKE 1 TABLET BY MOUTH ONCE DAILY  . ezetimibe (ZETIA) 10 MG tablet TAKE 1 TABLET BY MOUTH ONCE DAILY  . ipratropium-albuterol (DUONEB) 0.5-2.5 (3) MG/3ML SOLN Take 3 mLs by nebulization every 6 (six) hours.  [name]. lisinopril-hydrochlorothiazide (ZESTORETIC) 20-25 MG tablet Take 1 tablet by mouth daily.  . metoprolol tartrate (LOPRESSOR) 50 MG tablet Take 50 mg by mouth 2 (two) times daily.   . montelukast (SINGULAIR) 10 MG tablet Take 1 tablet (10 mg total) by mouth at bedtime.  . nicotine (NICODERM CQ - DOSED IN MG/24 HOURS) 21 mg/24hr patch Place 1 patch (21 mg total) onto the skin daily.  . nitroGLYCERIN (NITROSTAT) 0.4 MG SL tablet DISSOLVE ONE TABLET UNDER THE TONGUE EVERY 5 MINUTES AS NEEDED FOR  CHEST PAIN.  DO NOT EXCEED A TOTAL OF 3 DOSES IN 15 MINUTES  . pantoprazole (PROTONIX) 40 MG tablet Take 1 tablet by mouth once daily  . tamsulosin (FLOMAX) 0.4 MG CAPS capsule Take 1 capsule (0.4 mg total) by mouth daily.  . TRELEGY ELLIPTA 100-62.5-25 MCG/INH AEPB INHALE 1 PUFF ONCE DAILY  . [DISCONTINUED] metoprolol tartrate 75 MG TABS Take 75 mg by mouth 2 (two) times daily.     Allergies:   Patient has no known allergies.   Social History   Tobacco Use  . Smoking status: Current Every Day Smoker    Packs/day: 1.00    Types: Cigarettes  . Smokeless tobacco: Never Used  . Tobacco comment: pack per day 12.11.2019  Substance Use Topics  . Alcohol use: No  . Drug use: No     Family Hx: The patient's family history includes CAD in his brother and brother; Hypertension in his father and mother; Stroke in his sister and sister.  ROS:   Please see the history of present illness.     All other systems reviewed and are negative.   Prior CV studies:   The following studies were reviewed today:    Labs/Other  Tests and Data Reviewed:    EKG:  No ECG reviewed.  Recent Labs: 08/04/2017: ALT 5 12/27/2017: BUN 14; Creatinine, Ser 1.09; Hemoglobin 10.8; Platelets 180; Potassium 3.6; Sodium 135   Recent Lipid Panel Lab Results  Component Value Date/Time   CHOL 151 08/04/2017 02:58 PM   CHOL 161 12/14/2012 03:46 AM   TRIG 70 08/04/2017 02:58 PM   TRIG 140 12/14/2012 03:46 AM   HDL 41 08/04/2017 02:58 PM   HDL 47 12/14/2012 03:46 AM   CHOLHDL 3.7 08/04/2017 02:58 PM   CHOLHDL 4.6 12/08/2014 03:37 AM   LDLCALC 96 08/04/2017 02:58 PM   LDLCALC 86 12/14/2012 03:46 AM    Wt Readings from Last 3 Encounters:  05/03/18 220 lb (99.8 kg)  12/27/17 209 lb 6.4 oz (95 kg)  12/20/17 209 lb 6.4 oz (95 kg)     Objective:    Vital Signs:  BP 140/68   Pulse 76   Ht  (1.88 m)   Wt 220 lb (99.8 kg)   BMI 28.25 kg/m      ASSESSMENT & PLAN:    1.  Coronary artery disease  involving native coronary arteries without angina: He is doing well overall with no anginal symptoms.  Continue medical therapy. I elected to keep him on Plavix given residual disease in the LAD.  2.  Recurrent SVT and nonsustained ventricular tachycardia: He denies palpitations on current dose of metoprolol 50 mg twice daily.  The dose was increased by the ED physician from 75 mg twice daily in December after he presented with syncope and hypotension.  He seems to be stable on current medications.  3. Essential hypertension: Blood pressure is reasonably controlled on metoprolol and lisinopril-hydrochlorothiazide  4. Hyperlipidemia: Continue high dose atorvastatin and Zetia.  Will get lipid and liver profile during next visit.  5. Tobacco use I again discussed with him the importance of smoking cessation.  6.  Abdominal aortic and iliac aneurysms: Followed by Dr. dew.  COVID-19 Education: The signs and symptoms of COVID-19 were discussed with the patient and how to seek care for testing (follow up with PCP or arrange E-visit).  The importance of social distancing was discussed today.  Time:   Today, I have spent 23 minutes with the patient with telehealth technology discussing the above problems.     Medication Adjustments/Labs and Tests Ordered: Current medicines are reviewed at length with the patient today.  Concerns regarding medicines are outlined above.   Tests Ordered: No orders of the defined types were placed in this encounter.   Medication Changes: No orders of the defined types were placed in this encounter.   Disposition:  Follow up in 6 month(s)  Signed, Lorine Bears, MD  05/03/2018 9:36 AM    [facility]

## 2018-05-03 NOTE — Patient Instructions (Signed)
Medication Instructions:  Continue same medications If you need a refill on your cardiac medications before your next appointment, please call your pharmacy.   Lab work: Lipid and liver profile with next visit in 6 months If you have labs (blood work) drawn today and your tests are completely normal, you will receive your results only by: [name] MyChart Message (if you have MyChart) OR . A paper copy in the mail If you have any lab test that is abnormal or we need to change your treatment, we will call you to review the results.  Testing/Procedures: None  Follow-Up: At [facility], you and your health needs are our priority.  As part of our continuing mission to provide you with exceptional heart care, we have created designated Provider Care Teams.  These Care Teams include your primary Cardiologist (physician) and Advanced Practice Providers (APPs -  Physician Assistants and Nurse Practitioners) who all work together to provide you with the care you need, when you need it. You will need a follow up appointment in 6 months.  Please call our office 2 months in advance to schedule this appointment.

## 2018-07-09 NOTE — Progress Notes (Signed)
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 -Continue Trelegy once daily, use nebulizer as needed.  Continues to have dyspnea on exertion which is worse than before, discussed that his breathing will continue to get worse as long as he continues to smoke. - Prevnar 06/20/2017 - Given his severe COPD (FEV1 26%), do not think the patient would be a good candidate for lung cancer screening. - He declines pulmonary reha previously.  Recommended that he try to walk 20 to 30 minutes/day.   Nicotine abuse. -Stopped smoking today.  Encouraged to continue cessation, asked to call us back if he would like to be started on a nicotine patch. -Spent greater than 3 minutes in discussion.  Return in about 6 months (around 01/09/2019).   Date: 07/09/2018  MRN# [id number] Jacob Ford 1940/10/13    Jacob Ford is a 78 y.o. old male seen in consultation for chief complaint of: dyspnea.      HPI:  Jacob Ford is a  78 y.o. male with a history of COPD, coronary artery disease, nicotine abuse, history of hospitalization in February 2019 for COPD exacerbation.  At last visit he was asked to continue Trelegy once daily, counseled on smoking cessation. Since his last visit he feels that he still has some dyspnea on mild exertion, and slightly worse. He is using Trelegy inhaler daily, rinses mouth after use.Duonebs bid and feels that they help.  He is smoking about a ppd. He is thinking about quitting, he thinks that he is going to stop today. He is not using anything to help.  He used to ride an exercise bike at home about 3 days per week but stopped.   **PFT 03/30/2017; tracings personally reviewed, FVC is 41% predicted, FEV1 is 26% predicted there is no significant reversibility with bronchodilator therapy.  Flow volume loop appears obstructed. TLC is 56% predicted, RV/TLC ratio is elevated.  Diffusion  capacity is severely reduced. - Overall this test shows severe obstructive and restrictive lung disease.  Cardiac catheterization 03/06/17; 1.  Significant underlying two-vessel coronary artery disease with patent stent in the distal right coronary artery without significant restenosis.  Known moderate proximal to mid LAD disease and significant distal LAD stenosis both unchanged from before. 2. Low normal LV systolic function with an EF of 50-55%.  Mildly to moderately reduced LV EDP at 18-20 mmHg.  Medication:    Current Outpatient Medications:  .  aspirin EC 81 MG EC tablet, Take 1 tablet (81 mg total) by mouth daily., Disp: 30 tablet, Rfl: 2 .  atorvastatin (LIPITOR) 80 MG tablet, Take 1 tablet (80 mg total) by mouth daily at 6 PM., Disp: 30 tablet, Rfl: 2 .  clopidogrel (PLAVIX) 75 MG tablet, TAKE 1 TABLET BY MOUTH ONCE DAILY, Disp: 90 tablet, Rfl: 3 .  ezetimibe (ZETIA) 10 MG tablet, Take 1 tablet (10 mg total) by mouth daily., Disp: 90 tablet, Rfl: 2 .  ipratropium-albuterol (DUONEB) 0.5-2.5 (3) MG/3ML SOLN, Take 3 mLs by nebulization every 6 (six) hours., Disp: 360 mL, Rfl:  .  lisinopril-hydrochlorothiazide (ZESTORETIC) 20-25 MG tablet, Take 1 tablet by mouth daily., Disp: , Rfl:  .  metoprolol tartrate (LOPRESSOR) 50 MG tablet, Take 50 mg by mouth 2 (two) times daily. , Disp: , Rfl:  .  montelukast (SINGULAIR) 10 MG tablet, Take 1 tablet (10 mg total) by mouth at bedtime., Disp: 30 tablet, Rfl: 0 .  nitroGLYCERIN (NITROSTAT) 0.4 MG SL tablet, DISSOLVE ONE TABLET UNDER THE TONGUE EVERY 5 MINUTES AS NEEDED FOR CHEST PAIN.  DO NOT EXCEED A TOTAL OF 3 DOSES IN 15 MINUTES, Disp: 25 tablet, Rfl: 1 .  pantoprazole (PROTONIX) 40 MG tablet, Take 1 tablet (40 mg total) by mouth daily., Disp: 30 tablet, Rfl: 3 .  tamsulosin (FLOMAX) 0.4 MG CAPS capsule, Take 1 capsule (0.4 mg total) by mouth daily., Disp: 30 capsule, Rfl: 1 .  TRELEGY ELLIPTA 100-62.5-25 MCG/INH AEPB, INHALE 1 PUFF ONCE DAILY, Disp:  , Rfl: 3 No current facility-administered medications for this visit.   Facility-Administered Medications Ordered in Other Visits:  .  albuterol (PROVENTIL) (2.5 MG/3ML) 0.083% nebulizer solution 2.5 mg, 2.5 mg, Nebulization, Once, Garnet KoyanagiJohnson, Julie R, FNP   Allergies:  Patient has no known allergies.    LABORATORY PANEL:   CBC No results for input(s): WBC, HGB, HCT, PLT in the last 168 hours. ------------------------------------------------------------------------------------------------------------------  Chemistries  No results for input(s): NA, K, CL, CO2, GLUCOSE, BUN, CREATININE, CALCIUM, MG, AST, ALT, ALKPHOS, BILITOT in the last 168 hours.  Invalid input(s): GFRCGP ------------------------------------------------------------------------------------------------------------------  Cardiac Enzymes No results for input(s): TROPONINI in the last 168 hours. ------------------------------------------------------------  RADIOLOGY:  No results found.     Thank  you for the consultation and for allowing [facility] to assist in the care of your patient. Our recommendations are noted above.  Please contact us if we can be of further service.  Wells Guileseep Danyele Smejkal, M.D., F.C.C.P.  Board Certified in Internal Medicine, Pulmonary Medicine, Critical Care Medicine, and Sleep Medicine.  [facility] Office Number: [phone]  07/09/2018

## 2018-07-10 ENCOUNTER — Ambulatory Visit (INDEPENDENT_AMBULATORY_CARE_PROVIDER_SITE_OTHER): Payer: Medicare HMO | Admitting: Internal Medicine

## 2018-07-10 DIAGNOSIS — J449 Chronic obstructive pulmonary disease, unspecified: Secondary | ICD-10-CM

## 2018-07-10 DIAGNOSIS — F17210 Nicotine dependence, cigarettes, uncomplicated: Secondary | ICD-10-CM | POA: Diagnosis not present

## 2018-07-10 DIAGNOSIS — Z720 Tobacco use: Secondary | ICD-10-CM

## 2018-07-10 NOTE — Patient Instructions (Signed)
Continue using trilogy inhaler, use nebulizer machine as needed. Try to walk 20 to 30 minutes/day every evening, you can take it slow as you need to. If you need a prescription for nicotine patches call us so we can give you a prescription.  --Quitting smoking is the most important thing that you can do for your health.  --Quitting smoking will have greater affect on your health than any medicine that we can give you.

## 2018-07-18 ENCOUNTER — Telehealth: Payer: Self-pay | Admitting: Internal Medicine

## 2018-07-18 NOTE — Telephone Encounter (Signed)
Message routed to [place] office. 

## 2018-07-19 MED ORDER — NICOTINE 21 MG/24HR TD PT24: 21.0000 mg | MEDICATED_PATCH | TRANSDERMAL | 0 refills | Status: AC

## 2018-07-19 NOTE — Telephone Encounter (Signed)
Yes 21 mg.

## 2018-07-19 NOTE — Telephone Encounter (Signed)
Spoke to patient, aware we are sending in nicotine patches.

## 2018-09-25 ENCOUNTER — Other Ambulatory Visit: Payer: Self-pay | Admitting: Cardiovascular Disease

## 2018-10-11 DEATH — deceased

## 2019-04-30 IMAGING — CR DG CHEST 2V
1 series · 2 of 2 positions shown · non-contrast
Comparison: 12/05/2014 chest radiographs and CT

CLINICAL DATA: Chest pain and fatigue for 4 days. Cough. Shortness
of breath. Smoker.

EXAM:
CHEST  2 VIEW

[Series 1: dg chest 2 view · 0.14mm/px · 2 of 2 slices shown]
[im 1/2]
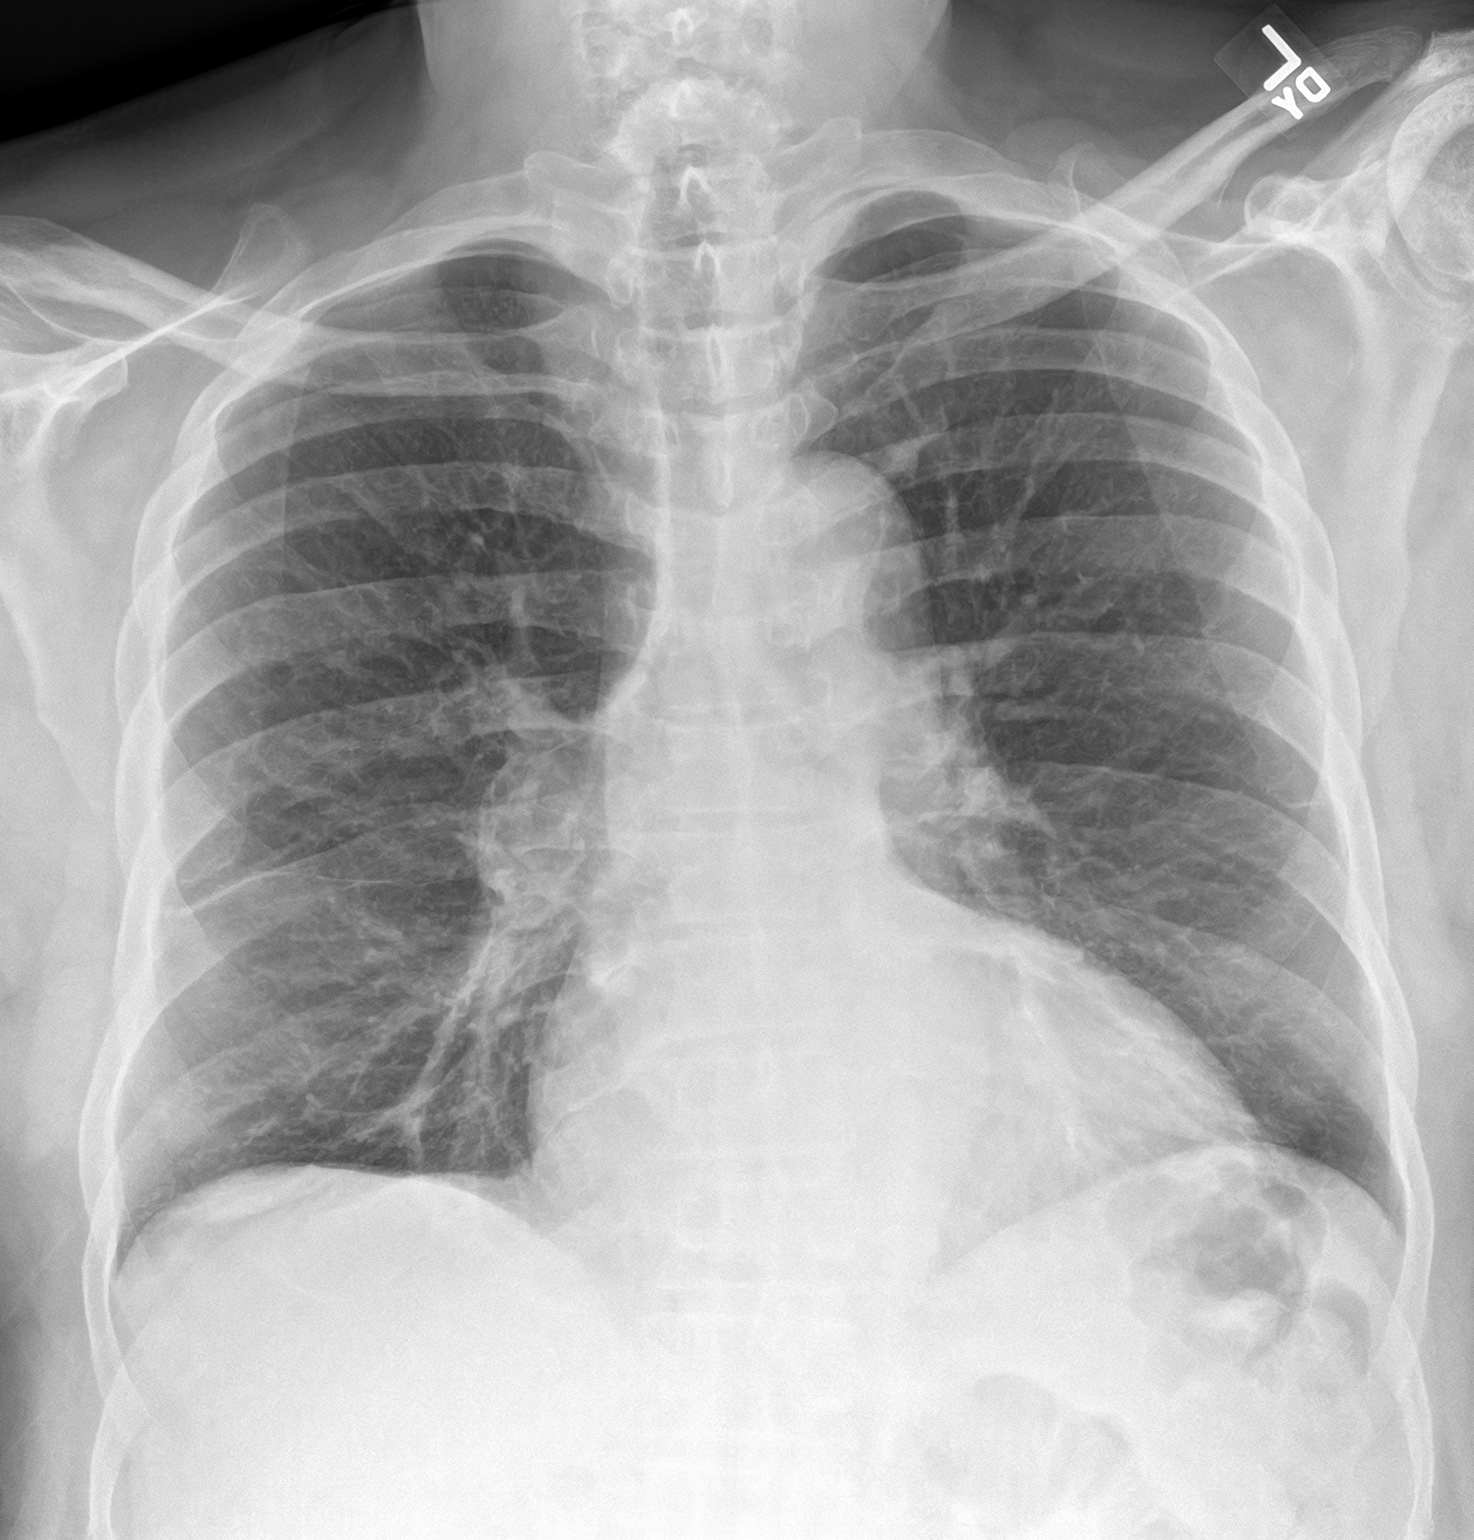
[im 2/2]
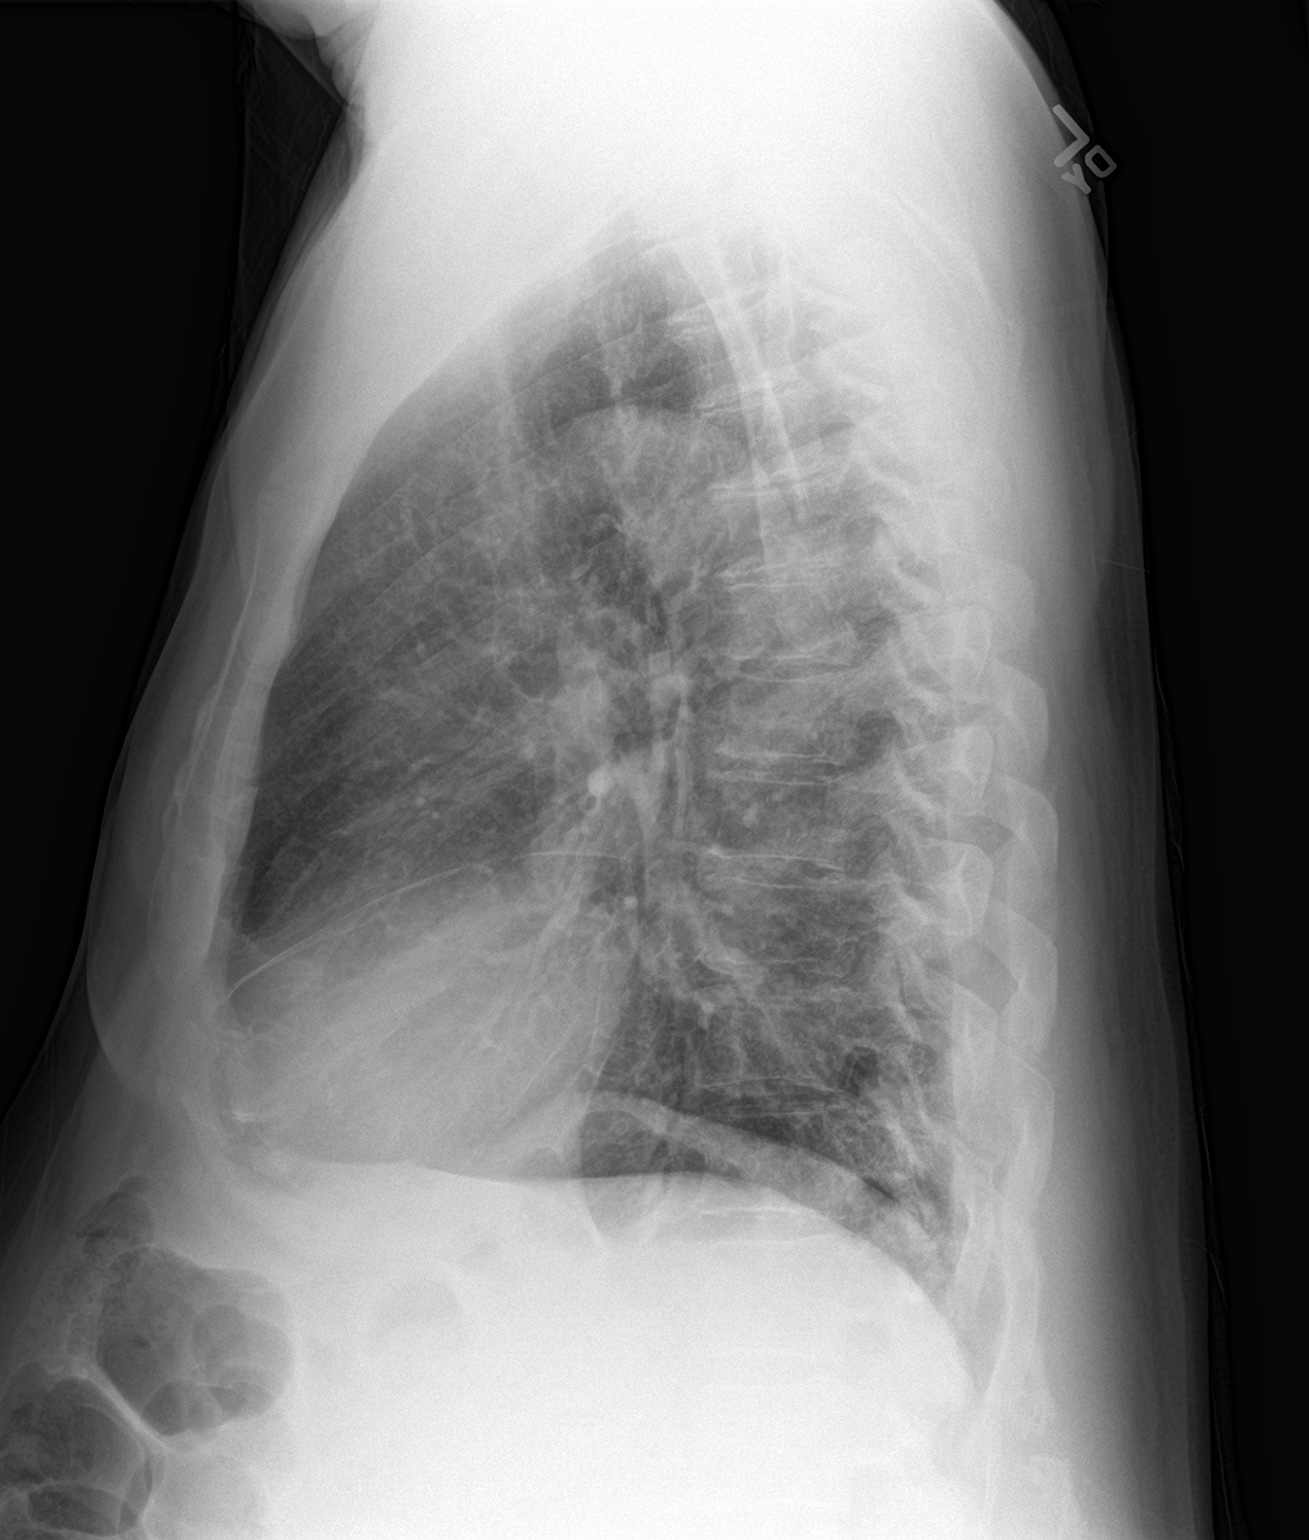

[2 of 2 positions shown; findings below may reference images not displayed]

FINDINGS: The cardiac silhouette is slightly enlarged. Aortic atherosclerosis
is noted. Mild prominence of the central pulmonary arteries is
unchanged. There is slight pleural thickening along the right minor
fissure. No confluent airspace opacity, overt pulmonary edema,
pleural effusion, or pneumothorax is identified. No acute osseous
abnormality is seen.
IMPRESSION: No active cardiopulmonary disease.

## 2019-05-19 IMAGING — CT CT HEAD W/O CM
4 of 5 series · 14 of 47 positions shown, 16 images · non-contrast
Comparison: Prior CT from 12/11/2012

CLINICAL DATA: Initial evaluation for acute syncope.

EXAM:
CT HEAD WITHOUT CONTRAST
TECHNIQUE: Contiguous axial images were obtained from the base of the skull
through the vertex without intravenous contrast.

[Series 2: head wo · axial · 0.43mm/px · z∈[-146,-91]mm · 3 of 33 slices shown (1 of 2)]
[im 6/33  brain]
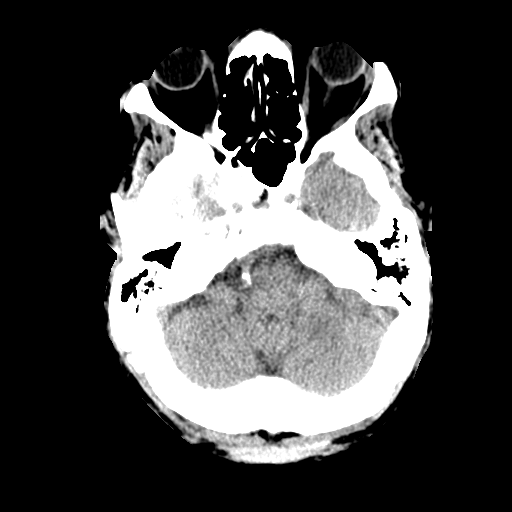
[im 11/33  brain]
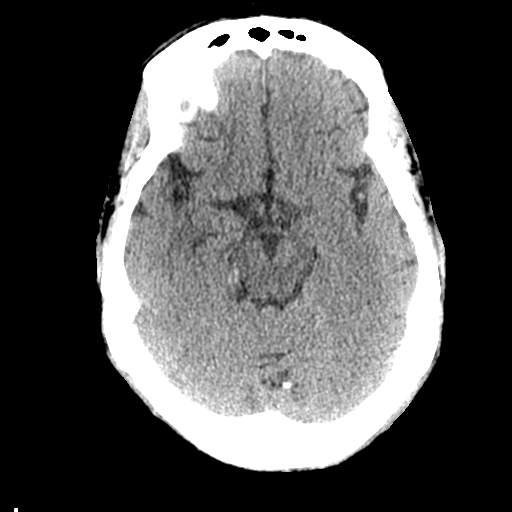
[im 17/33  brain]
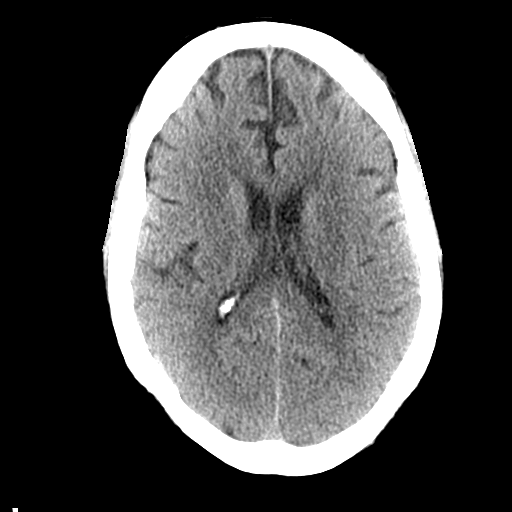

[Series 4: coronal soft tissue · coronal · 0.33mm/px · 3 of 67 slices shown]
[im 23/67  brain]
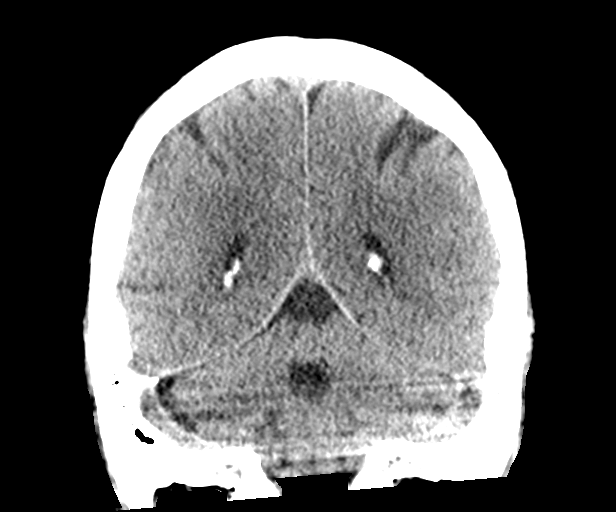
[im 30/67  brain]
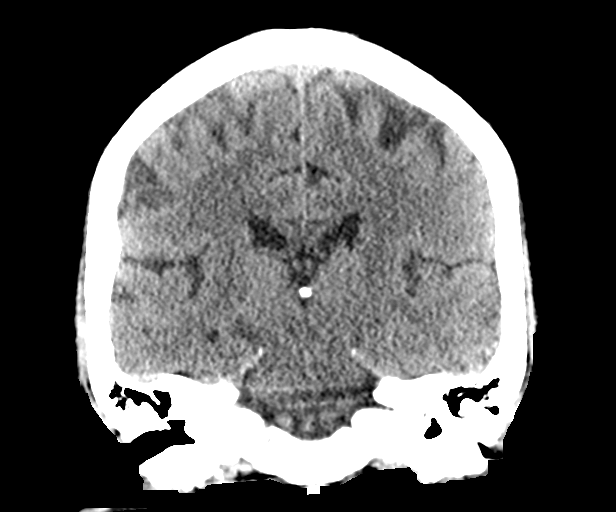
[im 37/67  brain]
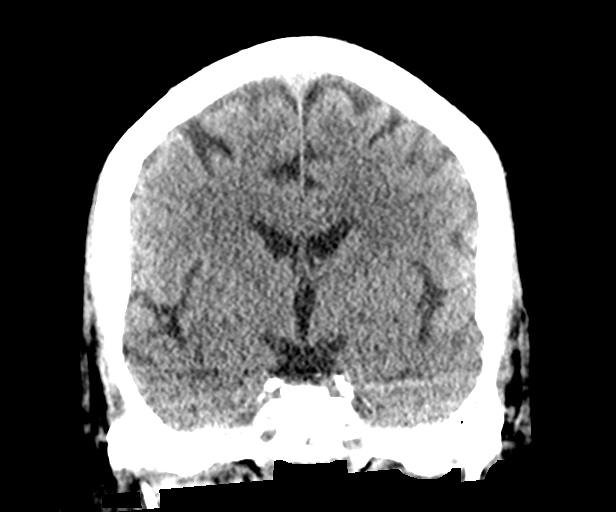

[Series 5: sagittal soft tissue · sagittal · 0.33mm/px · 3 of 49 slices shown]
[im 17/49  brain]
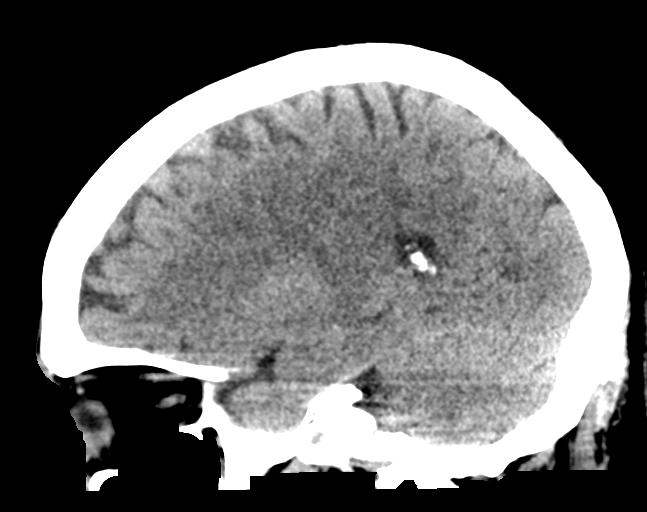
[im 25/49  brain]
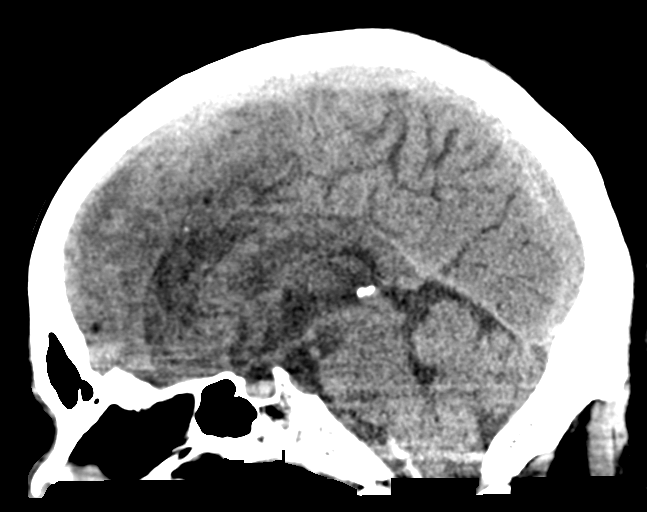
[im 33/49  brain]
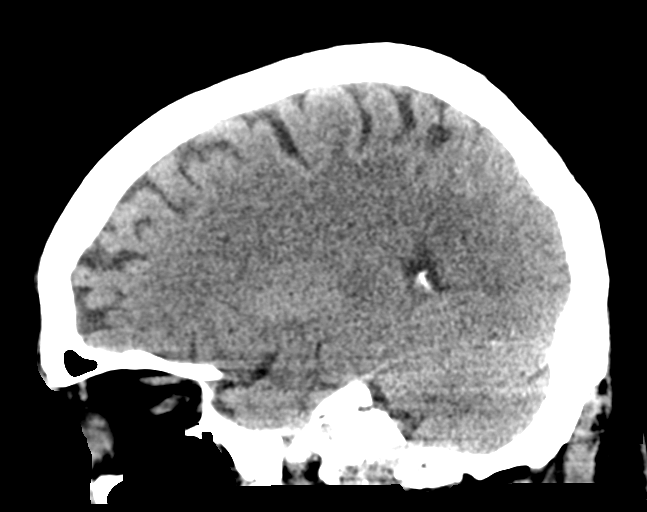

[Series 6: head wo · axial · 0.32mm/px · z∈[-130,-31]mm · 5 of 31 slices shown, 7 images (2 of 2)]
[im 6/31  brain]
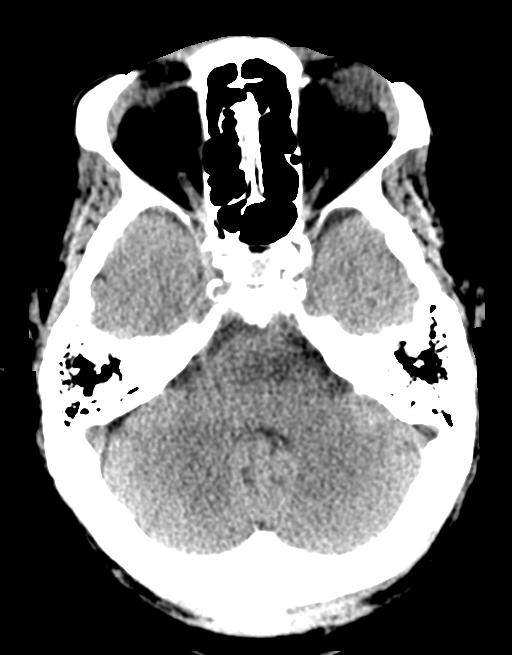
[im 6/31  bone]
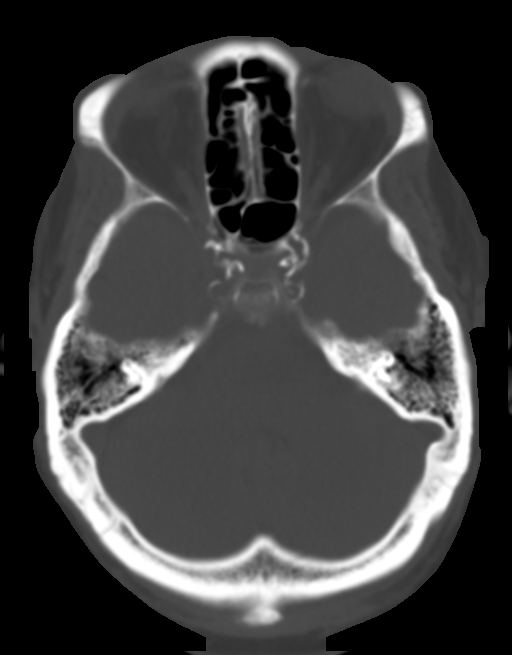
[im 11/31  brain]
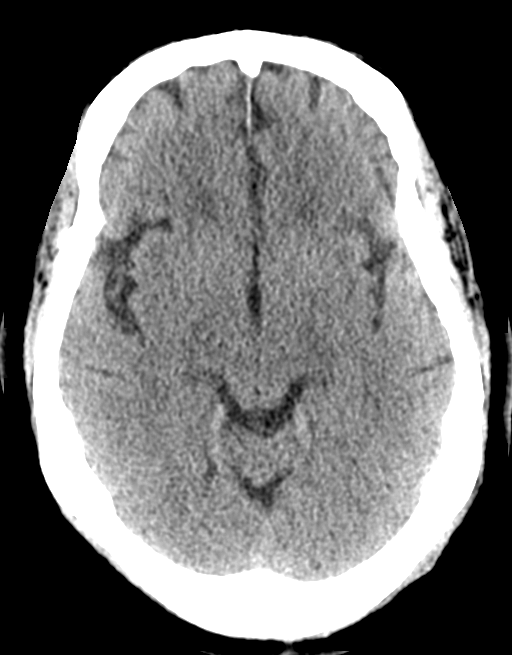
[im 16/31  brain]
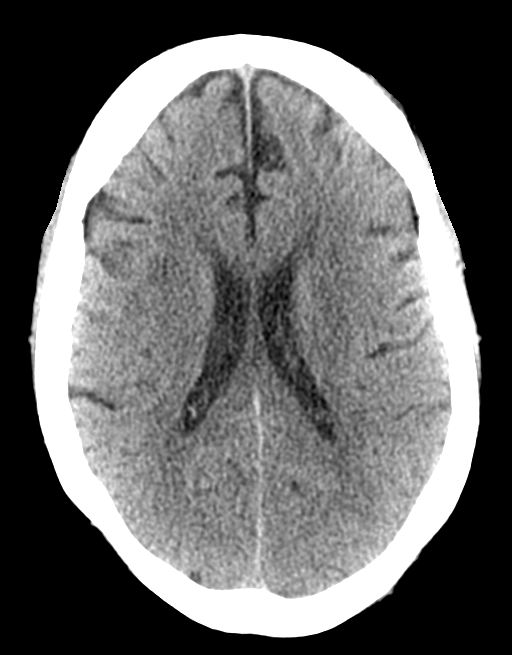
[im 21/31  brain]
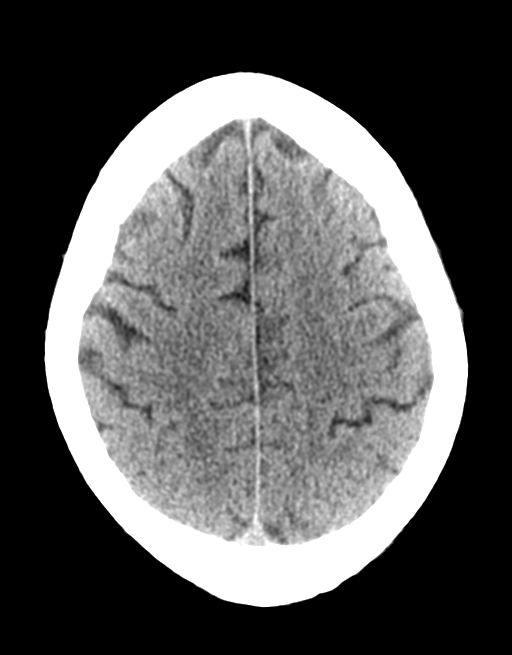
[im 26/31  brain]
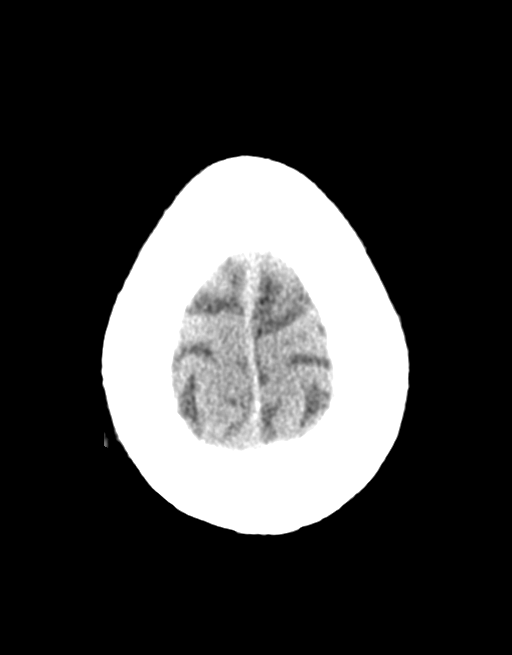
[im 26/31  bone]
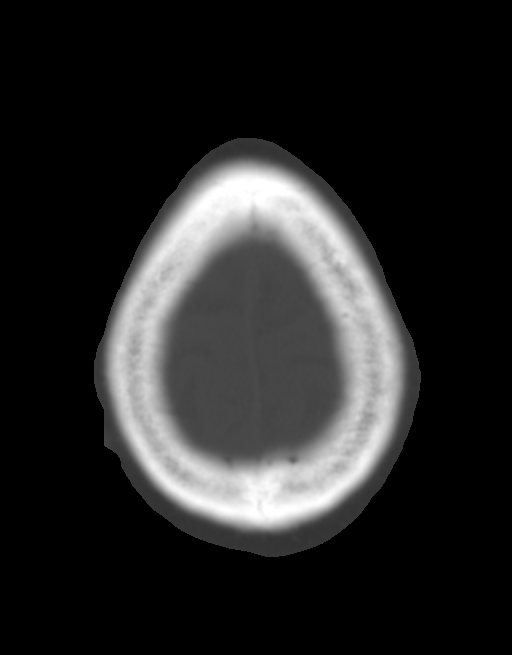

[14 of 47 positions shown; findings below may reference images not displayed]

FINDINGS: Brain: Age-appropriate cerebral volume. Mild chronic small vessel
ischemic disease. Small remote right parietal infarct noted. No
acute intracranial hemorrhage. No acute large vessel territory
infarct. No mass lesion, midline shift or mass effect. No
hydrocephalus. No extra-axial fluid collection.

Vascular: No hyperdense vessel.Calcified atherosclerosis present at
the skull base.

Skull: Focal soft tissue scarring noted at the right parietal scalp.
No acute scalp soft tissue abnormality. Calvarium intact.

Sinuses/Orbits: Globes normal soft tissues demonstrate no acute
abnormality. Mild scattered mucosal thickening within the ethmoidal
air cells and maxillary sinuses. Paranasal sinuses are otherwise
clear. No mastoid effusion.

Other: None.
IMPRESSION: 1. No acute intracranial abnormality.
2. Mild chronic small vessel ischemic disease with small remote
right parietal infarct.
